# Patient Record
Sex: Female | Born: 1940
Health system: Southern US, Community
[De-identification: ages and names within clinical notes are randomized; demographics above are authoritative.]

## PROBLEM LIST (undated history)

## (undated) DIAGNOSIS — I10 Essential (primary) hypertension: Secondary | ICD-10-CM

## (undated) DIAGNOSIS — E785 Hyperlipidemia, unspecified: Secondary | ICD-10-CM

## (undated) DIAGNOSIS — R0600 Dyspnea, unspecified: Secondary | ICD-10-CM

## (undated) DIAGNOSIS — E039 Hypothyroidism, unspecified: Secondary | ICD-10-CM

## (undated) DIAGNOSIS — I739 Peripheral vascular disease, unspecified: Secondary | ICD-10-CM

## (undated) DIAGNOSIS — J449 Chronic obstructive pulmonary disease, unspecified: Secondary | ICD-10-CM

## (undated) DIAGNOSIS — F32A Depression, unspecified: Secondary | ICD-10-CM

## (undated) DIAGNOSIS — R0609 Other forms of dyspnea: Secondary | ICD-10-CM

## (undated) DIAGNOSIS — F329 Major depressive disorder, single episode, unspecified: Secondary | ICD-10-CM

## (undated) HISTORY — DX: Peripheral vascular disease, unspecified: I73.9

## (undated) HISTORY — DX: Hypothyroidism, unspecified: E03.9

## (undated) HISTORY — DX: Chronic obstructive pulmonary disease, unspecified: J44.9

## (undated) HISTORY — DX: Other forms of dyspnea: R06.09

## (undated) HISTORY — DX: Depression, unspecified: F32.A

## (undated) HISTORY — DX: Essential (primary) hypertension: I10

## (undated) HISTORY — DX: Major depressive disorder, single episode, unspecified: F32.9

## (undated) HISTORY — DX: Hyperlipidemia, unspecified: E78.5

## (undated) HISTORY — DX: Dyspnea, unspecified: R06.00

## (undated) HISTORY — PX: THROAT SURGERY: SHX803

## (undated) HISTORY — PX: OTHER SURGICAL HISTORY: SHX169

---

## 2009-12-03 ENCOUNTER — Ambulatory Visit: Payer: Self-pay | Admitting: Cardiovascular Disease

## 2009-12-22 ENCOUNTER — Telehealth (INDEPENDENT_AMBULATORY_CARE_PROVIDER_SITE_OTHER): Payer: Self-pay | Admitting: *Deleted

## 2009-12-22 ENCOUNTER — Encounter: Payer: Self-pay | Admitting: Cardiovascular Disease

## 2009-12-22 DIAGNOSIS — I70219 Atherosclerosis of native arteries of extremities with intermittent claudication, unspecified extremity: Secondary | ICD-10-CM | POA: Insufficient documentation

## 2009-12-23 ENCOUNTER — Ambulatory Visit: Payer: Self-pay

## 2009-12-23 ENCOUNTER — Ambulatory Visit: Payer: Self-pay | Admitting: Cardiology

## 2009-12-23 ENCOUNTER — Encounter: Payer: Self-pay | Admitting: Cardiovascular Disease

## 2009-12-23 ENCOUNTER — Encounter (HOSPITAL_COMMUNITY): Admission: RE | Admit: 2009-12-23 | Discharge: 2010-02-04 | Payer: Self-pay | Admitting: Cardiovascular Disease

## 2009-12-23 ENCOUNTER — Ambulatory Visit (HOSPITAL_COMMUNITY): Admission: RE | Admit: 2009-12-23 | Discharge: 2009-12-23 | Payer: Self-pay | Admitting: Cardiovascular Disease

## 2009-12-23 ENCOUNTER — Encounter: Payer: Self-pay | Admitting: Cardiology

## 2009-12-31 ENCOUNTER — Ambulatory Visit: Payer: Self-pay | Admitting: Cardiovascular Disease

## 2010-06-15 NOTE — Miscellaneous (Signed)
Summary: Orders Update  Clinical Lists Changes  Problems: Added new problem of ATHEROSLERO NATV ART EXTREM W/INTERMIT CLAUDICAT (ICD-440.21) Orders: Added new Test order of Arterial Duplex Lower Extremity (Arterial Duplex Low) - Signed

## 2010-06-15 NOTE — Progress Notes (Signed)
Summary: Nuclear Pre-Procedure  Phone Note Outgoing Call Call back at [facility] Phone [phone]   Call placed by: Eliezer Lofts, EMT-P,  December 22, 2009 2:46 PM Call placed to: Patient Action Taken: Phone Call Completed Summary of Call: Reviewed information on Myoview Information Sheet (see scanned document for further details).  Spoke with the patient.    Nuclear Med Background Indications for Stress Test: Evaluation for Ischemia   History: COPD   Symptoms: DOE    Nuclear Pre-Procedure Cardiac Risk Factors: History of Smoking, Hypertension, Lipids, PVD

## 2010-06-15 NOTE — Assessment & Plan Note (Signed)
Summary: Cardiology Nuclear Testing  Nuclear Med Background Indications for Stress Test: Evaluation for Ischemia   History: COPD  History Comments: NO DOCUMENTED CAD  Symptoms: Chest Tightness, DOE  Symptoms Comments: Last episode of [id number] week.   Nuclear Pre-Procedure Cardiac Risk Factors: Claudication, History of Smoking, Hypertension, Lipids, PVD Caffeine/Decaff Intake: None NPO After: 7:00 AM Lungs: Clear. IV 0.9% NS with Angio Cath: 24g     IV Site: Rt Hand IV Started by: Crissie Figures RN Chest Size (in) 38     Cup Size B     Height (in): 64 Weight (lb): 150 BMI: 25.84  Nuclear Med Study 1 or 2 day study:  1 day     Stress Test Type:  Stress Reading MD:  Loralie Champagne, MD     Referring MD:  Jerilynn Mages.Fletcher Anon, MD Resting Radionuclide:  Technetium 35m Tetrofosmin     Resting Radionuclide Dose:  10.9 mCi  Stress Radionuclide:  Technetium 51m Tetrofosmin     Stress Radionuclide Dose:  33.0 mCi   Stress Protocol Exercise Time (min):  4:00 min     Max HR:  130 bpm     Predicted Max HR:  Q000111Q bpm  Max Systolic BP: Q000111Q mm Hg     Percent Max HR:  86.67 %     METS: 5.3 Rate Pressure Product:  23920    Stress Test Technologist:  Valetta Fuller CMA-N     Nuclear Technologist:  Vedia Pereyra CNMT  Rest Procedure  Myocardial perfusion imaging was performed at rest 45 minutes following the intravenous administration of Myoview Technetium 10m Tetrofosmin.  Stress Procedure  The patient exercised for four minutes.  The patient stopped due to dyspnea with an O2 Sat of 85-91% at peak exercise; in recovery she was 99%.  She denied any chest pain.  There were nonspecific T- wave changes and a rare PJC.  Myoview was injected at peak exercise and myocardial perfusion imaging was performed after a brief delay.  QPS Raw Data Images:  Normal; no motion artifact; normal heart/lung ratio. Stress Images:  NI: Uniform and normal uptake of tracer in all myocardial segments. Rest Images:  Uniform and  normal uptake of tracer in all myocardial segments. Subtraction (SDS):  There is no evidence of scar or ischemia. Transient Ischemic Dilatation:  1.12  (Normal <1.22)  Lung/Heart Ratio:  0.24  (Normal <0.45)  Quantitative Gated Spect Images QGS EDV:  54 ml QGS ESV:  19 ml QGS EF:  66 % QGS cine images:  Normal wall motion.    Overall Impression  Exercise Capacity: Poor exercise capacity. BP Response: Normal blood pressure response. Clinical Symptoms: Dyspnea with decreased oxygen saturation to 85% ECG Impression: 1 mm horizontal ST depression in the inferior leads and V4-V5 noted in early recovery.  Overall Impression: Normal perfusion images with no evidence for ischemia or infarction.  ST changes likely are false positive.

## 2010-09-20 ENCOUNTER — Encounter: Payer: Self-pay | Admitting: Cardiovascular Disease

## 2010-09-20 ENCOUNTER — Ambulatory Visit: Payer: Self-pay | Admitting: Cardiovascular Disease

## 2010-09-23 ENCOUNTER — Encounter: Payer: Self-pay | Admitting: Cardiovascular Disease

## 2010-09-23 ENCOUNTER — Ambulatory Visit (INDEPENDENT_AMBULATORY_CARE_PROVIDER_SITE_OTHER): Payer: PRIVATE HEALTH INSURANCE | Admitting: Cardiovascular Disease

## 2010-09-23 VITALS — BP 150/82 | HR 96 | Ht 64.0 in | Wt 149.0 lb

## 2010-09-23 DIAGNOSIS — I739 Peripheral vascular disease, unspecified: Secondary | ICD-10-CM

## 2010-09-23 DIAGNOSIS — I10 Essential (primary) hypertension: Secondary | ICD-10-CM | POA: Insufficient documentation

## 2010-09-23 NOTE — Assessment & Plan Note (Signed)
The patient has a chronically occluded right subclavian artery with minimal symptoms at this time. She is right-handed and able to perform her activities of daily living without much limitations. There is no clinical symptoms of a subclavian steel syndrome. Thus, I recommend continuing conservative treatment and medical management of her risk factors. Continue daily aspirin. Blood pressure should always be checked in her left arm which are reflects central aortic pressure more accurately. The patient can followup with me as needed.

## 2010-09-23 NOTE — Patient Instructions (Signed)
Your physician recommends that you schedule a follow-up appointment in: as needed  

## 2010-09-23 NOTE — Progress Notes (Signed)
HPI  This is a 70 year old female who is here today for a followup visit. She has a history of chronically occluded right subclavian artery. This has been managed conservatively given that she has only minimal symptoms. There is no other clear symptoms of claudication in the right arm and no evidence of steal syndrome. She is doing well overall with no reported symptoms of chest pain, dizziness, or syncope.  No Known Allergies   Current Outpatient Prescriptions on File Prior to Visit  Medication Sig Dispense Refill  . amLODipine (NORVASC) 2.5 MG tablet Take 2.5 mg by mouth daily.        Marland Kitchen aspirin 325 MG tablet Take 325 mg by mouth daily.        Marland Kitchen conjugated estrogens (PREMARIN) vaginal cream Place vaginally once a week.        . esomeprazole (NEXIUM) 40 MG capsule Take 40 mg by mouth daily before breakfast.        . levothyroxine (SYNTHROID, LEVOTHROID) 100 MCG tablet Take 100 mcg by mouth daily.        Marland Kitchen loratadine (CLARITIN) 10 MG tablet Take 10 mg by mouth daily.        Marland Kitchen losartan (COZAAR) 100 MG tablet Take 100 mg by mouth daily.        . ranitidine (ZANTAC) 300 MG tablet Take 300 mg by mouth at bedtime.        . rosuvastatin (CRESTOR) 40 MG tablet Take 40 mg by mouth daily.        Marland Kitchen tiotropium (SPIRIVA) 18 MCG inhalation capsule Place 18 mcg into inhaler and inhale daily.        . vitamin B-12 (CYANOCOBALAMIN) 500 MCG tablet Take 500 mcg by mouth daily.           Past Medical History  Diagnosis Date  . PVD (peripheral vascular disease)     occluded right subclavian artery. asymptomatic.   Marland Kitchen COPD (chronic obstructive pulmonary disease)   . HLD (hyperlipidemia)   . Hypothyroidism   . Depression   . Exertional dyspnea   . HTN (hypertension)      Past Surgical History  Procedure Date  . Throat surgery   . Hysterectomy - unknown type      No family history on file.   History   Social History  . Marital Status: Single    Spouse Name: N/A    Number of Children: 1  .  Years of Education: N/A   Occupational History  . retired    Social History Main Topics  . Smoking status: Former Smoker    Quit date: 05/16/1994  . Smokeless tobacco: Not on file  . Alcohol Use: No  . Drug Use: No  . Sexually Active: Not on file   Other Topics Concern  . Not on file   Social History Narrative  . No narrative on file       PHYSICAL EXAM   BP 150/82  Pulse 96  Ht 5\' 4"  (1.626 m)  Wt 149 lb (67.586 kg)  BMI 25.58 kg/m2  SpO2 94%  Constitutional: She is oriented to person, place, and time. She appears well-developed and well-nourished. No distress.  HENT: No nasal discharge.  Head: Normocephalic and atraumatic.  Eyes: Pupils are equal, round, and reactive to light. Right eye exhibits no discharge. Left eye exhibits no discharge.  Neck: Normal range of motion. Neck supple. No JVD present. No thyromegaly present.  Cardiovascular: Normal rate, regular rhythm, normal heart sounds. Exam  reveals no gallop and no friction rub.  No murmur heard. Right radial pulse is not palpable.  Pulmonary/Chest: Effort normal and breath sounds normal. No stridor. No respiratory distress. She has no wheezes. She has no rales. She exhibits no tenderness.  Abdominal: Soft. Bowel sounds are normal. She exhibits no distension. There is no tenderness. There is no rebound and no guarding.  Musculoskeletal: Normal range of motion. She exhibits no edema and no tenderness.  Neurological: She is alert and oriented to person, place, and time. Coordination normal.  Skin: Skin is warm and dry. No rash noted. She is not diaphoretic. No erythema. No pallor.  Psychiatric: She has a normal mood and affect. Her behavior is normal. Judgment and thought content normal.     ASSESSMENT AND PLAN

## 2010-09-28 NOTE — Letter (Signed)
December 03, 2009    Maryella Shivers, MD  [address]  [place]  [place], [place] [address]   RE:  [name] [name]  MRN:  [id number]  /  DOB:  [birth year]   Dear Dr. Nyra Capes:   Thank you for referring [name] [name] for vascular evaluation.  As you are  aware, this is a pleasant 70 year old female with the following problem  list:  1. History of peripheral vascular disease with known occluded right      subclavian artery since at least 2009.  2. Chronic obstructive pulmonary disease.  3. Hypertension.  4. Hypothyroidism.  5. Hyperlipidemia.  6. History of depression.   [name] [name] is here today for evaluation of right subclavian occlusion.  The patient was noted over the last few years to have decreased pulses  in the right arm with difficulty obtaining blood pressure readings in  there.  In terms of her symptoms,  she complains of numbness in both  hands which is worse on the right side especially at night.  She is  right-handed and she is able to do all her activities of daily living  with her right hand.  She does not get any cramps or fatigue in the arm.  She denies any visual changes or dizziness with activities.  She does  complain of bilateral cramps in her lower extremities in the calf area  also worse on the right side.  This can happen at rest also and not  necessarily with activities.  She had few episodes of chest pain in the  past but none recently.  She does complain of exertional dyspnea with  minimal activities, especially recently.  She has no previous cardiac  history.  She denies any presyncope or syncope.  There are no reported  palpitations.   CURRENT MEDICATIONS:  1. Spiriva once daily.  2. Aspirin 325 mg once daily.  3. Claritin.  4. Levothyroxine 100 mcg once daily.  5. Losartan 100 mg once daily.  6. Nexium 40 mg daily.  7. Amlodipine 2.5 mg once daily.  8. Crestor 40 mg at night.  9. Ranitidine 300 mg at bedtime.  10.Veramyst once  daily.  11.Vitamin B12.   ALLERGIES:  No known drug allergies.   SOCIAL HISTORY:  She quit smoking in 1996.  She used to smoke one pack  per day for many years.  She denies any alcohol or drug use.  She is  retired.   PAST SURGICAL HISTORY:  Includes hysterectomy and tonsillectomy.   FAMILY HISTORY:  Negative for coronary artery disease or peripheral  arterial disease.   REVIEW OF SYSTEMS:  Remarkable for exertional dyspnea, arm numbness.  There is no chest pain.  She does have atypical lower extremity  claudication.  A full review of systems was performed and is otherwise  negative.   PHYSICAL EXAMINATION:  IN GENERAL:  She appears to be well-developed and  in no acute distress.  VITAL SIGNS:  Weight is 150 pounds, could not obtain the blood pressure  in the right arm.  Blood pressure in the left arm was 154/92, pulse is  93, oxygen saturation is 95% on room air.  NECK:  Examination reveals a surgical scar from previous surgery.  There  is no JVD or carotid bruits.  No masses are palpable.  RESPIRATORY:  Normal respiratory effort with no use of accessory  muscles.  Auscultation reveals normal breath sounds with no crackles or  wheezing.  CARDIOVASCULAR:  Normal PMI by  palpation.  Regular rate and rhythm with  no gallops or murmurs.  ABDOMEN:  Benign, nontender, nondistended.  EXTREMITIES:  With no clubbing, cyanosis or edema.  VASCULAR EXAM:  Radial pulses not palpable in the right side.  Brachial  pulse is very faint, +1.  On the left side the radial and brachial  pulses are normal.  There are no bruits in the subclavian area.  There  are no abdominal bruits.  Femoral pulses are well preserved.  Pedal  pulses bilaterally are diminished, +1.   An electrocardiogram was done which showed normal sinus rhythm with left  atrial enlargement and nonspecific T-wave changes.   IMPRESSION:  1. Peripheral arterial disease:  The patient has evidence of      subclavian occlusion.  She  did undergo an MRA which showed total      occlusion of the right subclavian artery at the origin.  The length      of the occlusion is approximately 3 cm.  The subclavian      reconstitute with collaterals in the right supraclavicular region      and the rest of the arteries seem to be normal.  It seems that this      occlusion is chronic and dated as far back as December of 2009 when      it was noted on CT of the neck.  The left side seems to be normal.      She does have a bovine arch.  Her current symptoms include numbness      in the arm which seems to be mild.  She does not have physical      limitation in the right arm.  There are no clear symptoms to      suggest steal syndrome.  Given that the occlusion is chronic, it      would be extremely difficult to intervene on this percutaneously.      If intervention is needed it will probably need to be surgical.  At      this time given that her symptoms are mild and she is able to      completely function in her right arm,  we  will observe this for      now.  Obviously, this indicates that she probably has diffuse      atherosclerosis related to her previous smoking and other risk      factors.  I agree with continuing aspirin daily.  She does have      symptoms of atypical claudication in the lower extremities and we      will evaluate this with an ankle-brachial index.  2. Exertional dyspnea.  We will have to rule out significant coronary      artery disease especially with the presence of atherosclerosis.  We      will request a stress test for further evaluation.  We will also      obtain an echocardiogram.  Aggressive treatment of medical      management is recommended as is being done.  The patient will      return for followup after these tests are done for further      evaluation.  Thank you for allowing ne to participate in the care      of your patient.    Sincerely,      [name], MD  Electronically  Signed    MA/MedQ  DD: 12/03/2009  DT: 12/03/2009  Job #: [id number]

## 2010-09-28 NOTE — Assessment & Plan Note (Signed)
[facility] OFFICE NOTE   [name], [name]                    MRN:          [id number]  DATE:12/31/2009                            DOB:          1940/11/27    This is a follow-up visit.   PROBLEM LIST:  1. History of peripheral vascular disease with known occluded right      subclavian artery.  2. Chronic obstructive pulmonary disease.  3. Hypertension.  4. Hyperlipidemia.  5. Hypothyroidism.  6. History of depression.   INTERVAL HISTORY:  [name] [name] was evaluated last month by me for a right  subclavian occlusion.  At that time, her symptoms were noted to be mild.  She also had atypical lower extremity discomfort.  Other complaint was  exertional dyspnea.  Thus, she underwent cardiac and vascular evaluation  with a stress test, echocardiogram, and an ABI.  Overall, she is still  feeling the same.  The main symptom in her right arm is numbness and  mild discomfort.  She is right-handed and she is able to do her  activities of daily living.   MEDICATIONS:  1. Spiriva.  2. Aspirin 325 mg daily.  3. Claritin.  4. Levothyroxine.  5. Losartan 100 mg once daily.  6. Nexium 40 mg once daily.  7. Amlodipine 2.5 mg once daily, which will be increased to 5 mg      daily.  8. Crestor 40 mg at bedtime.  9. Ranitidine.  10.Veramyst.  11.B12  12.Premarin.   ALLERGIES:  No known drug allergies.   PHYSICAL EXAMINATION:  GENERAL:  Overall, she appears to be in no acute  distress.  VITAL SIGNS:  Weight is 150 pounds, blood pressure is 157/92 in the left  arm, pulse is 98, oxygen saturation is 97% on room air.  NECK:  No JVD or carotid bruits.  LUNGS:  Clear to auscultation.  HEART:  Regular rate and rhythm with no gallops or murmurs.  ABDOMEN:  Benign, nontender, nondistended.  EXTREMITIES:  No clubbing, cyanosis, or edema.   IMPRESSION:  1. Peripheral arterial disease with chronic occlusion of  the right      subclavian artery.  At the present time, her symptoms seems to be      mild and she does not have any significant functional limitation in      the right arm.  Thus, revascularization is not indicated at this      time.  We will continue to monitor.  Her blood pressure should      always be checked in the left arm which will an estimate of central      blood pressure.  Blood pressure in the right arm will always be      very low.  We will continue with aggressive medical therapy for      risk factors.  We will continue daily aspirin as well as high-dose      Crestor.  2. Hypertension:  Her blood pressure is elevated and thus, we will      increase amlodipine to 5 mg once daily.  3. Exertional dyspnea with  multiple risk factors for coronary artery      disease.  She underwent stress test which showed no evidence of      ischemia.  She did have hypoxia with exercise which is likely due      to her chronic obstructive pulmonary disease.  She does not have      hypoxia at rest.  Her echocardiogram showed normal left ventricular      systolic function with mild diastolic dysfunction and no      significant valvular abnormalities.  The patient will follow up      with me in 9 months from now or earlier if needed.     [name] Sacramento, MD  Electronically Signed    MA/MedQ  DD: 12/31/2009  DT: 01/01/2010  Job #: [id number]

## 2010-11-09 ENCOUNTER — Encounter: Payer: Self-pay | Admitting: Cardiovascular Disease

## 2011-03-01 ENCOUNTER — Encounter: Payer: Self-pay | Admitting: Cardiovascular Disease

## 2016-10-25 DIAGNOSIS — K219 Gastro-esophageal reflux disease without esophagitis: Secondary | ICD-10-CM | POA: Diagnosis not present

## 2016-11-01 DIAGNOSIS — D631 Anemia in chronic kidney disease: Secondary | ICD-10-CM | POA: Diagnosis not present

## 2016-11-01 DIAGNOSIS — N184 Chronic kidney disease, stage 4 (severe): Secondary | ICD-10-CM | POA: Diagnosis not present

## 2016-11-01 DIAGNOSIS — Z0189 Encounter for other specified special examinations: Secondary | ICD-10-CM | POA: Diagnosis not present

## 2016-11-01 DIAGNOSIS — I129 Hypertensive chronic kidney disease with stage 1 through stage 4 chronic kidney disease, or unspecified chronic kidney disease: Secondary | ICD-10-CM | POA: Diagnosis not present

## 2016-11-01 DIAGNOSIS — N2581 Secondary hyperparathyroidism of renal origin: Secondary | ICD-10-CM | POA: Diagnosis not present

## 2016-11-02 DIAGNOSIS — Z139 Encounter for screening, unspecified: Secondary | ICD-10-CM | POA: Diagnosis not present

## 2016-11-02 DIAGNOSIS — E039 Hypothyroidism, unspecified: Secondary | ICD-10-CM | POA: Diagnosis not present

## 2016-11-02 DIAGNOSIS — Z6823 Body mass index (BMI) 23.0-23.9, adult: Secondary | ICD-10-CM | POA: Diagnosis not present

## 2016-11-02 DIAGNOSIS — J449 Chronic obstructive pulmonary disease, unspecified: Secondary | ICD-10-CM | POA: Diagnosis not present

## 2016-12-16 DIAGNOSIS — E039 Hypothyroidism, unspecified: Secondary | ICD-10-CM | POA: Diagnosis not present

## 2017-01-23 DIAGNOSIS — N184 Chronic kidney disease, stage 4 (severe): Secondary | ICD-10-CM | POA: Diagnosis not present

## 2017-02-03 DIAGNOSIS — N2581 Secondary hyperparathyroidism of renal origin: Secondary | ICD-10-CM | POA: Diagnosis not present

## 2017-02-03 DIAGNOSIS — I129 Hypertensive chronic kidney disease with stage 1 through stage 4 chronic kidney disease, or unspecified chronic kidney disease: Secondary | ICD-10-CM | POA: Diagnosis not present

## 2017-02-03 DIAGNOSIS — Z23 Encounter for immunization: Secondary | ICD-10-CM | POA: Diagnosis not present

## 2017-02-03 DIAGNOSIS — N183 Chronic kidney disease, stage 3 (moderate): Secondary | ICD-10-CM | POA: Diagnosis not present

## 2017-02-03 DIAGNOSIS — D631 Anemia in chronic kidney disease: Secondary | ICD-10-CM | POA: Diagnosis not present

## 2017-02-27 DIAGNOSIS — E039 Hypothyroidism, unspecified: Secondary | ICD-10-CM | POA: Diagnosis not present

## 2017-02-27 DIAGNOSIS — N183 Chronic kidney disease, stage 3 (moderate): Secondary | ICD-10-CM | POA: Diagnosis not present

## 2017-02-27 DIAGNOSIS — E782 Mixed hyperlipidemia: Secondary | ICD-10-CM | POA: Diagnosis not present

## 2017-02-27 DIAGNOSIS — I129 Hypertensive chronic kidney disease with stage 1 through stage 4 chronic kidney disease, or unspecified chronic kidney disease: Secondary | ICD-10-CM | POA: Diagnosis not present

## 2017-03-03 DIAGNOSIS — J3089 Other allergic rhinitis: Secondary | ICD-10-CM | POA: Diagnosis not present

## 2017-03-06 DIAGNOSIS — J3089 Other allergic rhinitis: Secondary | ICD-10-CM | POA: Diagnosis not present

## 2017-03-07 DIAGNOSIS — J3089 Other allergic rhinitis: Secondary | ICD-10-CM | POA: Diagnosis not present

## 2017-03-08 DIAGNOSIS — J3089 Other allergic rhinitis: Secondary | ICD-10-CM | POA: Diagnosis not present

## 2017-03-09 DIAGNOSIS — J3089 Other allergic rhinitis: Secondary | ICD-10-CM | POA: Diagnosis not present

## 2017-03-10 DIAGNOSIS — J3089 Other allergic rhinitis: Secondary | ICD-10-CM | POA: Diagnosis not present

## 2017-03-13 DIAGNOSIS — J3089 Other allergic rhinitis: Secondary | ICD-10-CM | POA: Diagnosis not present

## 2017-03-14 DIAGNOSIS — J3089 Other allergic rhinitis: Secondary | ICD-10-CM | POA: Diagnosis not present

## 2017-05-18 DIAGNOSIS — H2513 Age-related nuclear cataract, bilateral: Secondary | ICD-10-CM | POA: Diagnosis not present

## 2017-05-18 DIAGNOSIS — H5203 Hypermetropia, bilateral: Secondary | ICD-10-CM | POA: Diagnosis not present

## 2017-05-18 DIAGNOSIS — H43823 Vitreomacular adhesion, bilateral: Secondary | ICD-10-CM | POA: Diagnosis not present

## 2017-07-24 DIAGNOSIS — Z139 Encounter for screening, unspecified: Secondary | ICD-10-CM | POA: Diagnosis not present

## 2017-07-24 DIAGNOSIS — I129 Hypertensive chronic kidney disease with stage 1 through stage 4 chronic kidney disease, or unspecified chronic kidney disease: Secondary | ICD-10-CM | POA: Diagnosis not present

## 2017-07-24 DIAGNOSIS — E039 Hypothyroidism, unspecified: Secondary | ICD-10-CM | POA: Diagnosis not present

## 2017-07-24 DIAGNOSIS — Z9181 History of falling: Secondary | ICD-10-CM | POA: Diagnosis not present

## 2017-07-24 DIAGNOSIS — Z1331 Encounter for screening for depression: Secondary | ICD-10-CM | POA: Diagnosis not present

## 2017-07-24 DIAGNOSIS — Z0000 Encounter for general adult medical examination without abnormal findings: Secondary | ICD-10-CM | POA: Diagnosis not present

## 2017-07-24 DIAGNOSIS — E782 Mixed hyperlipidemia: Secondary | ICD-10-CM | POA: Diagnosis not present

## 2017-07-31 DIAGNOSIS — N184 Chronic kidney disease, stage 4 (severe): Secondary | ICD-10-CM | POA: Diagnosis not present

## 2017-07-31 DIAGNOSIS — I129 Hypertensive chronic kidney disease with stage 1 through stage 4 chronic kidney disease, or unspecified chronic kidney disease: Secondary | ICD-10-CM | POA: Diagnosis not present

## 2017-07-31 DIAGNOSIS — E039 Hypothyroidism, unspecified: Secondary | ICD-10-CM | POA: Diagnosis not present

## 2017-07-31 DIAGNOSIS — E782 Mixed hyperlipidemia: Secondary | ICD-10-CM | POA: Diagnosis not present

## 2017-08-30 DIAGNOSIS — N183 Chronic kidney disease, stage 3 (moderate): Secondary | ICD-10-CM | POA: Diagnosis not present

## 2017-08-30 DIAGNOSIS — N189 Chronic kidney disease, unspecified: Secondary | ICD-10-CM | POA: Diagnosis not present

## 2017-08-31 DIAGNOSIS — H2513 Age-related nuclear cataract, bilateral: Secondary | ICD-10-CM | POA: Diagnosis not present

## 2017-08-31 DIAGNOSIS — H43823 Vitreomacular adhesion, bilateral: Secondary | ICD-10-CM | POA: Diagnosis not present

## 2017-09-06 DIAGNOSIS — N2581 Secondary hyperparathyroidism of renal origin: Secondary | ICD-10-CM | POA: Diagnosis not present

## 2017-09-06 DIAGNOSIS — D631 Anemia in chronic kidney disease: Secondary | ICD-10-CM | POA: Diagnosis not present

## 2017-09-06 DIAGNOSIS — I129 Hypertensive chronic kidney disease with stage 1 through stage 4 chronic kidney disease, or unspecified chronic kidney disease: Secondary | ICD-10-CM | POA: Diagnosis not present

## 2017-09-06 DIAGNOSIS — N183 Chronic kidney disease, stage 3 (moderate): Secondary | ICD-10-CM | POA: Diagnosis not present

## 2017-09-13 DIAGNOSIS — I129 Hypertensive chronic kidney disease with stage 1 through stage 4 chronic kidney disease, or unspecified chronic kidney disease: Secondary | ICD-10-CM | POA: Diagnosis not present

## 2017-09-13 DIAGNOSIS — E039 Hypothyroidism, unspecified: Secondary | ICD-10-CM | POA: Diagnosis not present

## 2017-09-20 DIAGNOSIS — Z1331 Encounter for screening for depression: Secondary | ICD-10-CM | POA: Diagnosis not present

## 2017-09-20 DIAGNOSIS — N183 Chronic kidney disease, stage 3 (moderate): Secondary | ICD-10-CM | POA: Diagnosis not present

## 2017-09-20 DIAGNOSIS — E039 Hypothyroidism, unspecified: Secondary | ICD-10-CM | POA: Diagnosis not present

## 2017-09-20 DIAGNOSIS — I129 Hypertensive chronic kidney disease with stage 1 through stage 4 chronic kidney disease, or unspecified chronic kidney disease: Secondary | ICD-10-CM | POA: Diagnosis not present

## 2017-09-20 DIAGNOSIS — J449 Chronic obstructive pulmonary disease, unspecified: Secondary | ICD-10-CM | POA: Diagnosis not present

## 2017-09-20 DIAGNOSIS — Z139 Encounter for screening, unspecified: Secondary | ICD-10-CM | POA: Diagnosis not present

## 2017-09-20 DIAGNOSIS — Z0000 Encounter for general adult medical examination without abnormal findings: Secondary | ICD-10-CM | POA: Diagnosis not present

## 2017-11-30 DIAGNOSIS — Z1231 Encounter for screening mammogram for malignant neoplasm of breast: Secondary | ICD-10-CM | POA: Diagnosis not present

## 2018-02-27 DIAGNOSIS — N2581 Secondary hyperparathyroidism of renal origin: Secondary | ICD-10-CM | POA: Diagnosis not present

## 2018-02-27 DIAGNOSIS — N183 Chronic kidney disease, stage 3 (moderate): Secondary | ICD-10-CM | POA: Diagnosis not present

## 2018-02-27 DIAGNOSIS — N189 Chronic kidney disease, unspecified: Secondary | ICD-10-CM | POA: Diagnosis not present

## 2018-03-05 DIAGNOSIS — N2581 Secondary hyperparathyroidism of renal origin: Secondary | ICD-10-CM | POA: Diagnosis not present

## 2018-03-05 DIAGNOSIS — I129 Hypertensive chronic kidney disease with stage 1 through stage 4 chronic kidney disease, or unspecified chronic kidney disease: Secondary | ICD-10-CM | POA: Diagnosis not present

## 2018-03-05 DIAGNOSIS — N183 Chronic kidney disease, stage 3 (moderate): Secondary | ICD-10-CM | POA: Diagnosis not present

## 2018-03-05 DIAGNOSIS — D631 Anemia in chronic kidney disease: Secondary | ICD-10-CM | POA: Diagnosis not present

## 2018-03-21 DIAGNOSIS — E039 Hypothyroidism, unspecified: Secondary | ICD-10-CM | POA: Diagnosis not present

## 2018-03-21 DIAGNOSIS — I129 Hypertensive chronic kidney disease with stage 1 through stage 4 chronic kidney disease, or unspecified chronic kidney disease: Secondary | ICD-10-CM | POA: Diagnosis not present

## 2018-03-28 DIAGNOSIS — Z23 Encounter for immunization: Secondary | ICD-10-CM | POA: Diagnosis not present

## 2018-03-28 DIAGNOSIS — I129 Hypertensive chronic kidney disease with stage 1 through stage 4 chronic kidney disease, or unspecified chronic kidney disease: Secondary | ICD-10-CM | POA: Diagnosis not present

## 2018-03-28 DIAGNOSIS — E782 Mixed hyperlipidemia: Secondary | ICD-10-CM | POA: Diagnosis not present

## 2018-03-28 DIAGNOSIS — N183 Chronic kidney disease, stage 3 (moderate): Secondary | ICD-10-CM | POA: Diagnosis not present

## 2018-03-28 DIAGNOSIS — N184 Chronic kidney disease, stage 4 (severe): Secondary | ICD-10-CM | POA: Diagnosis not present

## 2018-05-22 DIAGNOSIS — H2513 Age-related nuclear cataract, bilateral: Secondary | ICD-10-CM | POA: Diagnosis not present

## 2018-05-22 DIAGNOSIS — H43823 Vitreomacular adhesion, bilateral: Secondary | ICD-10-CM | POA: Diagnosis not present

## 2018-05-22 DIAGNOSIS — H5203 Hypermetropia, bilateral: Secondary | ICD-10-CM | POA: Diagnosis not present

## 2018-07-16 DIAGNOSIS — J189 Pneumonia, unspecified organism: Secondary | ICD-10-CM | POA: Diagnosis not present

## 2018-08-06 DIAGNOSIS — Z6822 Body mass index (BMI) 22.0-22.9, adult: Secondary | ICD-10-CM | POA: Diagnosis not present

## 2018-08-06 DIAGNOSIS — Z7689 Persons encountering health services in other specified circumstances: Secondary | ICD-10-CM | POA: Diagnosis not present

## 2018-08-06 DIAGNOSIS — J189 Pneumonia, unspecified organism: Secondary | ICD-10-CM | POA: Diagnosis not present

## 2018-08-06 DIAGNOSIS — Z7189 Other specified counseling: Secondary | ICD-10-CM | POA: Diagnosis not present

## 2018-09-21 DIAGNOSIS — E782 Mixed hyperlipidemia: Secondary | ICD-10-CM | POA: Diagnosis not present

## 2018-09-21 DIAGNOSIS — E039 Hypothyroidism, unspecified: Secondary | ICD-10-CM | POA: Diagnosis not present

## 2018-09-21 DIAGNOSIS — I129 Hypertensive chronic kidney disease with stage 1 through stage 4 chronic kidney disease, or unspecified chronic kidney disease: Secondary | ICD-10-CM | POA: Diagnosis not present

## 2018-09-26 DIAGNOSIS — N184 Chronic kidney disease, stage 4 (severe): Secondary | ICD-10-CM | POA: Diagnosis not present

## 2018-09-26 DIAGNOSIS — I129 Hypertensive chronic kidney disease with stage 1 through stage 4 chronic kidney disease, or unspecified chronic kidney disease: Secondary | ICD-10-CM | POA: Diagnosis not present

## 2018-09-26 DIAGNOSIS — Z0000 Encounter for general adult medical examination without abnormal findings: Secondary | ICD-10-CM | POA: Diagnosis not present

## 2018-09-26 DIAGNOSIS — Z139 Encounter for screening, unspecified: Secondary | ICD-10-CM | POA: Diagnosis not present

## 2018-09-26 DIAGNOSIS — E039 Hypothyroidism, unspecified: Secondary | ICD-10-CM | POA: Diagnosis not present

## 2018-09-26 DIAGNOSIS — N183 Chronic kidney disease, stage 3 (moderate): Secondary | ICD-10-CM | POA: Diagnosis not present

## 2019-02-18 DIAGNOSIS — E782 Mixed hyperlipidemia: Secondary | ICD-10-CM | POA: Diagnosis not present

## 2019-02-18 DIAGNOSIS — E039 Hypothyroidism, unspecified: Secondary | ICD-10-CM | POA: Diagnosis not present

## 2019-02-25 DIAGNOSIS — N184 Chronic kidney disease, stage 4 (severe): Secondary | ICD-10-CM | POA: Diagnosis not present

## 2019-02-25 DIAGNOSIS — J449 Chronic obstructive pulmonary disease, unspecified: Secondary | ICD-10-CM | POA: Diagnosis not present

## 2019-02-25 DIAGNOSIS — E782 Mixed hyperlipidemia: Secondary | ICD-10-CM | POA: Diagnosis not present

## 2019-02-25 DIAGNOSIS — E039 Hypothyroidism, unspecified: Secondary | ICD-10-CM | POA: Diagnosis not present

## 2019-03-12 DIAGNOSIS — H2513 Age-related nuclear cataract, bilateral: Secondary | ICD-10-CM | POA: Diagnosis not present

## 2019-03-12 DIAGNOSIS — H2511 Age-related nuclear cataract, right eye: Secondary | ICD-10-CM | POA: Diagnosis not present

## 2019-03-12 DIAGNOSIS — H25043 Posterior subcapsular polar age-related cataract, bilateral: Secondary | ICD-10-CM | POA: Diagnosis not present

## 2019-03-12 DIAGNOSIS — H18413 Arcus senilis, bilateral: Secondary | ICD-10-CM | POA: Diagnosis not present

## 2019-03-12 DIAGNOSIS — H43821 Vitreomacular adhesion, right eye: Secondary | ICD-10-CM | POA: Diagnosis not present

## 2019-03-12 DIAGNOSIS — H25013 Cortical age-related cataract, bilateral: Secondary | ICD-10-CM | POA: Diagnosis not present

## 2019-03-25 DIAGNOSIS — H2511 Age-related nuclear cataract, right eye: Secondary | ICD-10-CM | POA: Diagnosis not present

## 2019-03-26 DIAGNOSIS — H2512 Age-related nuclear cataract, left eye: Secondary | ICD-10-CM | POA: Diagnosis not present

## 2019-04-03 DIAGNOSIS — I6523 Occlusion and stenosis of bilateral carotid arteries: Secondary | ICD-10-CM | POA: Diagnosis not present

## 2019-04-15 DIAGNOSIS — H2512 Age-related nuclear cataract, left eye: Secondary | ICD-10-CM | POA: Diagnosis not present

## 2019-04-18 ENCOUNTER — Other Ambulatory Visit: Payer: Self-pay | Admitting: Family Medicine

## 2019-04-18 DIAGNOSIS — I6523 Occlusion and stenosis of bilateral carotid arteries: Secondary | ICD-10-CM

## 2019-04-26 ENCOUNTER — Ambulatory Visit
Admission: RE | Admit: 2019-04-26 | Discharge: 2019-04-26 | Disposition: A | Discharge status: Home / Self Care | Payer: Medicare Other | Source: Ambulatory Visit | Attending: Family Medicine | Admitting: Family Medicine

## 2019-04-26 DIAGNOSIS — I6523 Occlusion and stenosis of bilateral carotid arteries: Secondary | ICD-10-CM

## 2019-05-22 DIAGNOSIS — G458 Other transient cerebral ischemic attacks and related syndromes: Secondary | ICD-10-CM | POA: Diagnosis not present

## 2019-05-22 DIAGNOSIS — I6523 Occlusion and stenosis of bilateral carotid arteries: Secondary | ICD-10-CM | POA: Diagnosis not present

## 2019-05-22 DIAGNOSIS — Z6822 Body mass index (BMI) 22.0-22.9, adult: Secondary | ICD-10-CM | POA: Diagnosis not present

## 2019-07-19 DIAGNOSIS — Z136 Encounter for screening for cardiovascular disorders: Secondary | ICD-10-CM | POA: Diagnosis not present

## 2019-07-19 DIAGNOSIS — E039 Hypothyroidism, unspecified: Secondary | ICD-10-CM | POA: Diagnosis not present

## 2019-07-19 DIAGNOSIS — Z139 Encounter for screening, unspecified: Secondary | ICD-10-CM | POA: Diagnosis not present

## 2019-07-19 DIAGNOSIS — E782 Mixed hyperlipidemia: Secondary | ICD-10-CM | POA: Diagnosis not present

## 2019-07-19 DIAGNOSIS — Z0000 Encounter for general adult medical examination without abnormal findings: Secondary | ICD-10-CM | POA: Diagnosis not present

## 2019-07-19 DIAGNOSIS — Z7189 Other specified counseling: Secondary | ICD-10-CM | POA: Diagnosis not present

## 2019-07-26 DIAGNOSIS — E039 Hypothyroidism, unspecified: Secondary | ICD-10-CM | POA: Diagnosis not present

## 2019-07-26 DIAGNOSIS — N184 Chronic kidney disease, stage 4 (severe): Secondary | ICD-10-CM | POA: Diagnosis not present

## 2019-07-26 DIAGNOSIS — E782 Mixed hyperlipidemia: Secondary | ICD-10-CM | POA: Diagnosis not present

## 2019-07-26 DIAGNOSIS — I129 Hypertensive chronic kidney disease with stage 1 through stage 4 chronic kidney disease, or unspecified chronic kidney disease: Secondary | ICD-10-CM | POA: Diagnosis not present

## 2019-08-09 ENCOUNTER — Telehealth (HOSPITAL_COMMUNITY): Payer: Self-pay

## 2019-08-09 NOTE — Telephone Encounter (Signed)

## 2019-08-12 ENCOUNTER — Other Ambulatory Visit: Payer: Self-pay | Admitting: *Deleted

## 2019-08-12 DIAGNOSIS — I6529 Occlusion and stenosis of unspecified carotid artery: Secondary | ICD-10-CM

## 2019-08-13 ENCOUNTER — Other Ambulatory Visit: Payer: Self-pay

## 2019-08-13 ENCOUNTER — Encounter: Payer: Self-pay | Admitting: Vascular Surgery

## 2019-08-13 ENCOUNTER — Ambulatory Visit (HOSPITAL_COMMUNITY)
Admission: RE | Admit: 2019-08-13 | Discharge: 2019-08-13 | Disposition: A | Discharge status: Home / Self Care | Payer: Medicare Other | Source: Ambulatory Visit | Attending: Vascular Surgery | Admitting: Vascular Surgery

## 2019-08-13 ENCOUNTER — Ambulatory Visit (INDEPENDENT_AMBULATORY_CARE_PROVIDER_SITE_OTHER): Payer: Medicare Other | Admitting: Vascular Surgery

## 2019-08-13 DIAGNOSIS — I779 Disorder of arteries and arterioles, unspecified: Secondary | ICD-10-CM | POA: Insufficient documentation

## 2019-08-13 DIAGNOSIS — I6523 Occlusion and stenosis of bilateral carotid arteries: Secondary | ICD-10-CM | POA: Diagnosis not present

## 2019-08-13 DIAGNOSIS — I6529 Occlusion and stenosis of unspecified carotid artery: Secondary | ICD-10-CM | POA: Diagnosis not present

## 2019-08-13 NOTE — Progress Notes (Signed)
Patient name: [name] [name] MRN: [id number] DOB: 1940/07/11 Sex: female  REASON FOR CONSULT: Evaluate carotid artery disease  HPI: [name] [name] is a 79 y.o. female, with history of hypertension, hyperlipidemia, COPD, stage IV CKD, depression, previous head/neck cancer requiring radiation that presents for evaluation of carotid artery disease.  Patient has been followed by Dr. Nyra Capes and an ultrasound was obtained on 04/26/2019 that showed stenosis in the left ICA approaching 70%.  She had also some plaque in the right carotid system but nothing that appeared hemodynamically significant.  Ultimately a further work-up was recommended with CTA but given her chronic kidney disease she was referred to vascular surgery.  She denies any history of TIA or strokes.  She remains neurologically intact.  She has had neck radiation for remote cancer in the past.  She has known chronically occluded right subclavian artery with no right arm symptoms.  Past Medical History:  Diagnosis Date  . COPD (chronic obstructive pulmonary disease) ([facility])   . Depression   . Exertional dyspnea   . HLD (hyperlipidemia)   . HTN (hypertension)   . Hypothyroidism   . PVD (peripheral vascular disease) ([facility])    occluded right subclavian artery. asymptomatic.     Past Surgical History:  Procedure Laterality Date  . hysterectomy - unknown type    . THROAT SURGERY      History reviewed. No pertinent family history.  SOCIAL HISTORY: Social History   Socioeconomic History  . Marital status: Single    Spouse name: Not on file  . Number of children: 1  . Years of education: Not on file  . Highest education level: Not on file  Occupational History  . Occupation: retired  Tobacco Use  . Smoking status: Former Smoker    Quit date: 05/16/1994    Years since quitting: 25.2  . Smokeless tobacco: Never Used  Substance and Sexual Activity  . Alcohol use: No  . Drug use: No  . Sexual activity: Not on file  Other  Topics Concern  . Not on file  Social History Narrative  . Not on file   Social Determinants of Health   Financial Resource Strain:   . Difficulty of Paying Living Expenses:   Food Insecurity:   . Worried About Charity fundraiser in the Last Year:   . Arboriculturist in the Last Year:   Transportation Needs:   . Film/video editor (Medical):   Marland Kitchen Lack of Transportation (Non-Medical):   Physical Activity:   . Days of Exercise per Week:   . Minutes of Exercise per Session:   Stress:   . Feeling of Stress :   Social Connections:   . Frequency of Communication with Friends and Family:   . Frequency of Social Gatherings with Friends and Family:   . Attends Religious Services:   . Active Member of Clubs or Organizations:   . Attends Archivist Meetings:   Marland Kitchen Marital Status:   Intimate Partner Violence:   . Fear of Current or Ex-Partner:   . Emotionally Abused:   Marland Kitchen Physically Abused:   . Sexually Abused:     No Known Allergies  Current Outpatient Medications  Medication Sig Dispense Refill  . amLODipine (NORVASC) 2.5 MG tablet Take 2.5 mg by mouth daily.      Marland Kitchen aspirin 325 MG tablet Take 325 mg by mouth daily.      Marland Kitchen conjugated estrogens (PREMARIN) vaginal cream Place vaginally once a week.      Marland Kitchen  esomeprazole (NEXIUM) 40 MG capsule Take 40 mg by mouth daily before breakfast.      . levothyroxine (SYNTHROID, LEVOTHROID) 100 MCG tablet Take 100 mcg by mouth daily.      Marland Kitchen loratadine (CLARITIN) 10 MG tablet Take 10 mg by mouth daily.      Marland Kitchen losartan (COZAAR) 100 MG tablet Take 100 mg by mouth daily.      . ranitidine (ZANTAC) 300 MG tablet Take 300 mg by mouth at bedtime.      . rosuvastatin (CRESTOR) 40 MG tablet Take 40 mg by mouth daily.      Marland Kitchen tiotropium (SPIRIVA) 18 MCG inhalation capsule Place 18 mcg into inhaler and inhale daily.      . vitamin B-12 (CYANOCOBALAMIN) 500 MCG tablet Take 500 mcg by mouth daily.       No current facility-administered  medications for this visit.    REVIEW OF SYSTEMS:  [X]  denotes positive finding, [ ]  denotes negative finding Cardiac  Comments:  Chest pain or chest pressure:    Shortness of breath upon exertion:    Short of breath when lying flat:    Irregular heart rhythm:        Vascular    Pain in calf, thigh, or hip brought on by ambulation:    Pain in feet at night that wakes you up from your sleep:     Blood clot in your veins:    Leg swelling:         Pulmonary    Oxygen at home:    Productive cough:     Wheezing:         Neurologic    Sudden weakness in arms or legs:     Sudden numbness in arms or legs:     Sudden onset of difficulty speaking or slurred speech:    Temporary loss of vision in one eye:     Problems with dizziness:         Gastrointestinal    Blood in stool:     Vomited blood:         Genitourinary    Burning when urinating:     Blood in urine:        Psychiatric    Major depression:         Hematologic    Bleeding problems:    Problems with blood clotting too easily:        Skin    Rashes or ulcers:        Constitutional    Fever or chills:      PHYSICAL EXAM: Vitals:   08/13/19 1158 08/13/19 1203  BP: (!) 145/77 95/72  Pulse: 77 78  Resp: 16   Temp: 98.1 F (36.7 C)   TempSrc: Temporal   SpO2: 99%   Weight: 127 lb (57.6 kg)   Height: 5\' 4"  (1.626 m)     GENERAL: The patient is a well-nourished female, in no acute distress. The vital signs are documented above. CARDIAC: There is a regular rate and rhythm.  VASCULAR:  Palpable femoral and DP pulses bilaterally PULMONARY: There is good air exchange bilaterally without wheezing or rales. ABDOMEN: Soft and non-tender with normal pitched bowel sounds.  MUSCULOSKELETAL: There are no major deformities or cyanosis. NEUROLOGIC: No focal weakness or paresthesias are detected. SKIN: There are no ulcers or rashes noted. PSYCHIATRIC: The patient has a normal affect.  DATA:   Repeat carotid  duplex here shows no significant right ICA disease and  left ICA velocity in the 40 to 59% stenosis range.  Assessment/Plan:  79 year old female presents for evaluation of her carotid artery disease.  As noted above her carotid artery disease has remained asymptomatic and a duplex from December suggested stenosis approaching 70% in the left ICA.  Repeat duplex here today suggest 40 to 59% stenosis on the left.  I discussed with the patient and her daughter that in the setting of asymptomatic disease we reserve any type of surgical intervention for stenosis greater than 80%.  Both ultrasound studies suggest less than 80% stenosis and would favor just surveillance for now.  Her velocity ratio the outside institution would certainly suggest not critical stenosis.  I will see her in 6 months with another carotid duplex.   Marty Heck, MD [facility] Office: [phone]

## 2019-08-14 ENCOUNTER — Other Ambulatory Visit: Payer: Self-pay | Admitting: *Deleted

## 2019-08-14 DIAGNOSIS — I6523 Occlusion and stenosis of bilateral carotid arteries: Secondary | ICD-10-CM

## 2020-02-11 DIAGNOSIS — E039 Hypothyroidism, unspecified: Secondary | ICD-10-CM | POA: Diagnosis not present

## 2020-02-11 DIAGNOSIS — E782 Mixed hyperlipidemia: Secondary | ICD-10-CM | POA: Diagnosis not present

## 2020-02-19 DIAGNOSIS — E039 Hypothyroidism, unspecified: Secondary | ICD-10-CM | POA: Diagnosis not present

## 2020-02-19 DIAGNOSIS — N184 Chronic kidney disease, stage 4 (severe): Secondary | ICD-10-CM | POA: Diagnosis not present

## 2020-02-19 DIAGNOSIS — J449 Chronic obstructive pulmonary disease, unspecified: Secondary | ICD-10-CM | POA: Diagnosis not present

## 2020-02-19 DIAGNOSIS — I779 Disorder of arteries and arterioles, unspecified: Secondary | ICD-10-CM | POA: Diagnosis not present

## 2020-03-09 ENCOUNTER — Other Ambulatory Visit: Payer: Self-pay | Admitting: *Deleted

## 2020-03-09 DIAGNOSIS — I6523 Occlusion and stenosis of bilateral carotid arteries: Secondary | ICD-10-CM

## 2020-03-16 DIAGNOSIS — I779 Disorder of arteries and arterioles, unspecified: Secondary | ICD-10-CM | POA: Diagnosis not present

## 2020-03-16 DIAGNOSIS — N184 Chronic kidney disease, stage 4 (severe): Secondary | ICD-10-CM | POA: Diagnosis not present

## 2020-03-16 DIAGNOSIS — E039 Hypothyroidism, unspecified: Secondary | ICD-10-CM | POA: Diagnosis not present

## 2020-03-16 DIAGNOSIS — J449 Chronic obstructive pulmonary disease, unspecified: Secondary | ICD-10-CM | POA: Diagnosis not present

## 2020-03-25 DIAGNOSIS — E039 Hypothyroidism, unspecified: Secondary | ICD-10-CM | POA: Diagnosis not present

## 2020-03-25 DIAGNOSIS — N184 Chronic kidney disease, stage 4 (severe): Secondary | ICD-10-CM | POA: Diagnosis not present

## 2020-04-01 DIAGNOSIS — E039 Hypothyroidism, unspecified: Secondary | ICD-10-CM | POA: Diagnosis not present

## 2020-04-01 DIAGNOSIS — Z6821 Body mass index (BMI) 21.0-21.9, adult: Secondary | ICD-10-CM | POA: Diagnosis not present

## 2020-04-01 DIAGNOSIS — N184 Chronic kidney disease, stage 4 (severe): Secondary | ICD-10-CM | POA: Diagnosis not present

## 2020-04-01 DIAGNOSIS — Z139 Encounter for screening, unspecified: Secondary | ICD-10-CM | POA: Diagnosis not present

## 2020-04-07 ENCOUNTER — Other Ambulatory Visit: Payer: Self-pay

## 2020-04-07 ENCOUNTER — Encounter: Payer: Self-pay | Admitting: Vascular Surgery

## 2020-04-07 ENCOUNTER — Ambulatory Visit (HOSPITAL_COMMUNITY)
Admission: RE | Admit: 2020-04-07 | Discharge: 2020-04-07 | Disposition: A | Discharge status: Home / Self Care | Payer: Medicare Other | Source: Ambulatory Visit | Attending: Vascular Surgery | Admitting: Vascular Surgery

## 2020-04-07 ENCOUNTER — Ambulatory Visit (INDEPENDENT_AMBULATORY_CARE_PROVIDER_SITE_OTHER): Payer: Medicare Other | Admitting: Vascular Surgery

## 2020-04-07 VITALS — BP 105/74 | HR 66 | Temp 97.9°F | Resp 14 | Ht 64.0 in | Wt 121.0 lb

## 2020-04-07 DIAGNOSIS — I6523 Occlusion and stenosis of bilateral carotid arteries: Secondary | ICD-10-CM | POA: Insufficient documentation

## 2020-04-07 NOTE — Progress Notes (Signed)
Patient name: [name] [name] MRN: [id number] DOB: [birth year] Sex: female  REASON FOR CONSULT: 6 month follow-up for carotid artery surveillance  HPI: [name] [name] is a 79 y.o. female, with history of hypertension, hyperlipidemia, COPD, stage IV CKD, depression, previous head/neck cancer requiring radiation approximately 20 years ago that presents for 6 month follow-up of her carotid artery disease.  Patient has been followed by Dr. Nyra Capes and had an ultrasound was obtained on 04/26/2019 that showed stenosis in the left ICA approaching 70%.  She also had some plaque in the right carotid system but nothing that appeared hemodynamically significant.  Ultimately a further work-up was recommended with CTA but given her chronic kidney disease she was referred to vascular surgery.   When we initially did her ultrasound here 6 months ago it only showed a 40 to 59% left ICA stenosis and we recommended continued surveillance. She reports no changes to her medical history over the last 6 months.  No weakness or numbness in her arm or leg.  No vision loss.  Appears at her baseline.  Her PCP did stop her aspirin due to ongoing nosebleeds.  Past Medical History:  Diagnosis Date  . COPD (chronic obstructive pulmonary disease) ([place])   . Depression   . Exertional dyspnea   . HLD (hyperlipidemia)   . HTN (hypertension)   . Hypothyroidism   . PVD (peripheral vascular disease) ([facility])    occluded right subclavian artery. asymptomatic.     Past Surgical History:  Procedure Laterality Date  . hysterectomy - unknown type    . THROAT SURGERY      History reviewed. No pertinent family history.  SOCIAL HISTORY: Social History   Socioeconomic History  . Marital status: Single    Spouse name: Not on file  . Number of children: 1  . Years of education: Not on file  . Highest education level: Not on file  Occupational History  . Occupation: retired  Tobacco Use  . Smoking status: Former Smoker     Quit date: 05/16/1994    Years since quitting: 25.9  . Smokeless tobacco: Never Used  Substance and Sexual Activity  . Alcohol use: No  . Drug use: No  . Sexual activity: Not on file  Other Topics Concern  . Not on file  Social History Narrative  . Not on file   Social Determinants of Health   Financial Resource Strain:   . Difficulty of Paying Living Expenses: Not on file  Food Insecurity:   . Worried About Charity fundraiser in the Last Year: Not on file  . Ran Out of Food in the Last Year: Not on file  Transportation Needs:   . Lack of Transportation (Medical): Not on file  . Lack of Transportation (Non-Medical): Not on file  Physical Activity:   . Days of Exercise per Week: Not on file  . Minutes of Exercise per Session: Not on file  Stress:   . Feeling of Stress : Not on file  Social Connections:   . Frequency of Communication with Friends and Family: Not on file  . Frequency of Social Gatherings with Friends and Family: Not on file  . Attends Religious Services: Not on file  . Active Member of Clubs or Organizations: Not on file  . Attends Archivist Meetings: Not on file  . Marital Status: Not on file  Intimate Partner Violence:   . Fear of Current or Ex-Partner: Not on file  . Emotionally Abused: Not  on file  . Physically Abused: Not on file  . Sexually Abused: Not on file    No Known Allergies  Current Outpatient Medications  Medication Sig Dispense Refill  . amLODipine (NORVASC) 2.5 MG tablet Take 2.5 mg by mouth daily.      Marland Kitchen conjugated estrogens (PREMARIN) vaginal cream Place vaginally once a week.      . esomeprazole (NEXIUM) 40 MG capsule Take 40 mg by mouth daily before breakfast.      . levothyroxine (SYNTHROID, LEVOTHROID) 100 MCG tablet Take 100 mcg by mouth daily.      Marland Kitchen loratadine (CLARITIN) 10 MG tablet Take 10 mg by mouth daily.      Marland Kitchen losartan (COZAAR) 100 MG tablet Take 100 mg by mouth daily.      . ranitidine (ZANTAC) 300 MG tablet  Take 300 mg by mouth at bedtime.      . rosuvastatin (CRESTOR) 40 MG tablet Take 40 mg by mouth daily.      Marland Kitchen tiotropium (SPIRIVA) 18 MCG inhalation capsule Place 18 mcg into inhaler and inhale daily.      . vitamin B-12 (CYANOCOBALAMIN) 500 MCG tablet Take 500 mcg by mouth daily.      Marland Kitchen aspirin 325 MG tablet Take 325 mg by mouth daily.   (Patient not taking: Reported on 04/07/2020)     No current facility-administered medications for this visit.    REVIEW OF SYSTEMS:  [X]  denotes positive finding, [ ]  denotes negative finding Cardiac  Comments:  Chest pain or chest pressure:    Shortness of breath upon exertion:    Short of breath when lying flat:    Irregular heart rhythm:        Vascular    Pain in calf, thigh, or hip brought on by ambulation:    Pain in feet at night that wakes you up from your sleep:     Blood clot in your veins:    Leg swelling:         Pulmonary    Oxygen at home:    Productive cough:     Wheezing:         Neurologic    Sudden weakness in arms or legs:     Sudden numbness in arms or legs:     Sudden onset of difficulty speaking or slurred speech:    Temporary loss of vision in one eye:     Problems with dizziness:         Gastrointestinal    Blood in stool:     Vomited blood:         Genitourinary    Burning when urinating:     Blood in urine:        Psychiatric    Major depression:         Hematologic    Bleeding problems:    Problems with blood clotting too easily:        Skin    Rashes or ulcers:        Constitutional    Fever or chills:      PHYSICAL EXAM: Vitals:   04/07/20 1119 04/07/20 1121  BP: (!) 153/81 105/74  Pulse: 66 66  Resp: 14   Temp: 97.9 F (36.6 C)   TempSrc: Temporal   SpO2: 95%   Weight: 121 lb (54.9 kg)   Height: 5\' 4"  (1.626 m)     GENERAL: The patient is a well-nourished female, in no acute distress. The vital signs  are documented above. CARDIAC: There is a regular rate and rhythm.  VASCULAR:    Palpable femoral and DP pulses bilaterally PULMONARY: There is good air exchange bilaterally without wheezing or rales. ABDOMEN: Soft and non-tender with normal pitched bowel sounds.  MUSCULOSKELETAL: There are no major deformities or cyanosis. NEUROLOGIC: No focal weakness or paresthesias are detected.  CN II-XII grossly intact. PSYCHIATRIC: The patient has a normal affect.  DATA:   Repeat carotid duplex here shows minimal 1 to 39% in the proximal right ICA and a 40 to 59% stenosis in the distal ICA.  Left ICA also has a stable 40 to 59% stenosis.  Assessment/Plan:  79 year old female presents for 6 month follow-up of her carotid artery disease.  As noted above her carotid artery disease has remained asymptomatic and a duplex from last December suggested stenosis approaching 70% in the left ICA.  Repeat duplex here 6 months ago suggested 40 to 59% stenosis on the left.  This appears stable on the left in the 40-59% range again today. I discussed with the patient and her daughter that in the setting of asymptomatic disease we reserve any type of surgical intervention for stenosis greater than 80%.  Given two ultrasounds showing 40 to 59% stenosis will arrange follow-up in 1 year with ongoing carotid duplex.   Marty Heck, MD [facility] Office: [phone]

## 2020-04-15 DIAGNOSIS — J449 Chronic obstructive pulmonary disease, unspecified: Secondary | ICD-10-CM | POA: Diagnosis not present

## 2020-04-15 DIAGNOSIS — E039 Hypothyroidism, unspecified: Secondary | ICD-10-CM | POA: Diagnosis not present

## 2020-04-15 DIAGNOSIS — I779 Disorder of arteries and arterioles, unspecified: Secondary | ICD-10-CM | POA: Diagnosis not present

## 2020-04-15 DIAGNOSIS — N184 Chronic kidney disease, stage 4 (severe): Secondary | ICD-10-CM | POA: Diagnosis not present

## 2020-05-16 DIAGNOSIS — E039 Hypothyroidism, unspecified: Secondary | ICD-10-CM | POA: Diagnosis not present

## 2020-05-16 DIAGNOSIS — N184 Chronic kidney disease, stage 4 (severe): Secondary | ICD-10-CM | POA: Insufficient documentation

## 2020-05-16 DIAGNOSIS — J449 Chronic obstructive pulmonary disease, unspecified: Secondary | ICD-10-CM | POA: Diagnosis not present

## 2020-05-16 DIAGNOSIS — I779 Disorder of arteries and arterioles, unspecified: Secondary | ICD-10-CM | POA: Diagnosis not present

## 2020-05-16 DIAGNOSIS — E782 Mixed hyperlipidemia: Secondary | ICD-10-CM | POA: Insufficient documentation

## 2020-06-16 DIAGNOSIS — I779 Disorder of arteries and arterioles, unspecified: Secondary | ICD-10-CM | POA: Diagnosis not present

## 2020-06-16 DIAGNOSIS — N184 Chronic kidney disease, stage 4 (severe): Secondary | ICD-10-CM | POA: Diagnosis not present

## 2020-06-16 DIAGNOSIS — J449 Chronic obstructive pulmonary disease, unspecified: Secondary | ICD-10-CM | POA: Diagnosis not present

## 2020-06-16 DIAGNOSIS — E039 Hypothyroidism, unspecified: Secondary | ICD-10-CM | POA: Diagnosis not present

## 2020-07-13 DIAGNOSIS — N186 End stage renal disease: Secondary | ICD-10-CM | POA: Diagnosis not present

## 2020-07-13 DIAGNOSIS — E1122 Type 2 diabetes mellitus with diabetic chronic kidney disease: Secondary | ICD-10-CM | POA: Diagnosis not present

## 2020-07-13 DIAGNOSIS — Z992 Dependence on renal dialysis: Secondary | ICD-10-CM | POA: Diagnosis not present

## 2020-07-14 DIAGNOSIS — N184 Chronic kidney disease, stage 4 (severe): Secondary | ICD-10-CM | POA: Diagnosis not present

## 2020-07-14 DIAGNOSIS — E039 Hypothyroidism, unspecified: Secondary | ICD-10-CM | POA: Diagnosis not present

## 2020-07-14 DIAGNOSIS — I779 Disorder of arteries and arterioles, unspecified: Secondary | ICD-10-CM | POA: Diagnosis not present

## 2020-07-14 DIAGNOSIS — J449 Chronic obstructive pulmonary disease, unspecified: Secondary | ICD-10-CM | POA: Diagnosis not present

## 2020-08-14 DIAGNOSIS — I129 Hypertensive chronic kidney disease with stage 1 through stage 4 chronic kidney disease, or unspecified chronic kidney disease: Secondary | ICD-10-CM | POA: Diagnosis not present

## 2020-08-14 DIAGNOSIS — J449 Chronic obstructive pulmonary disease, unspecified: Secondary | ICD-10-CM | POA: Diagnosis not present

## 2020-08-21 DIAGNOSIS — E782 Mixed hyperlipidemia: Secondary | ICD-10-CM | POA: Diagnosis not present

## 2020-08-21 DIAGNOSIS — E039 Hypothyroidism, unspecified: Secondary | ICD-10-CM | POA: Diagnosis not present

## 2020-08-31 DIAGNOSIS — Z139 Encounter for screening, unspecified: Secondary | ICD-10-CM | POA: Diagnosis not present

## 2020-08-31 DIAGNOSIS — N184 Chronic kidney disease, stage 4 (severe): Secondary | ICD-10-CM | POA: Diagnosis not present

## 2020-08-31 DIAGNOSIS — E039 Hypothyroidism, unspecified: Secondary | ICD-10-CM | POA: Diagnosis not present

## 2020-08-31 DIAGNOSIS — Z0000 Encounter for general adult medical examination without abnormal findings: Secondary | ICD-10-CM | POA: Diagnosis not present

## 2020-08-31 DIAGNOSIS — Z136 Encounter for screening for cardiovascular disorders: Secondary | ICD-10-CM | POA: Diagnosis not present

## 2020-08-31 DIAGNOSIS — Z7189 Other specified counseling: Secondary | ICD-10-CM | POA: Diagnosis not present

## 2020-08-31 DIAGNOSIS — J449 Chronic obstructive pulmonary disease, unspecified: Secondary | ICD-10-CM | POA: Diagnosis not present

## 2020-08-31 DIAGNOSIS — E782 Mixed hyperlipidemia: Secondary | ICD-10-CM | POA: Diagnosis not present

## 2020-09-13 DIAGNOSIS — I129 Hypertensive chronic kidney disease with stage 1 through stage 4 chronic kidney disease, or unspecified chronic kidney disease: Secondary | ICD-10-CM | POA: Diagnosis not present

## 2020-09-13 DIAGNOSIS — E1169 Type 2 diabetes mellitus with other specified complication: Secondary | ICD-10-CM | POA: Diagnosis not present

## 2020-10-14 DIAGNOSIS — N184 Chronic kidney disease, stage 4 (severe): Secondary | ICD-10-CM | POA: Diagnosis not present

## 2020-10-14 DIAGNOSIS — I10 Essential (primary) hypertension: Secondary | ICD-10-CM | POA: Diagnosis not present

## 2020-10-14 DIAGNOSIS — E039 Hypothyroidism, unspecified: Secondary | ICD-10-CM | POA: Diagnosis not present

## 2020-10-14 DIAGNOSIS — J449 Chronic obstructive pulmonary disease, unspecified: Secondary | ICD-10-CM | POA: Diagnosis not present

## 2020-10-16 DIAGNOSIS — J449 Chronic obstructive pulmonary disease, unspecified: Secondary | ICD-10-CM | POA: Diagnosis not present

## 2020-10-16 DIAGNOSIS — N184 Chronic kidney disease, stage 4 (severe): Secondary | ICD-10-CM | POA: Diagnosis not present

## 2020-10-16 DIAGNOSIS — E039 Hypothyroidism, unspecified: Secondary | ICD-10-CM | POA: Diagnosis not present

## 2020-10-16 DIAGNOSIS — R634 Abnormal weight loss: Secondary | ICD-10-CM | POA: Diagnosis not present

## 2020-10-20 DIAGNOSIS — R053 Chronic cough: Secondary | ICD-10-CM | POA: Diagnosis not present

## 2020-10-20 DIAGNOSIS — I2510 Atherosclerotic heart disease of native coronary artery without angina pectoris: Secondary | ICD-10-CM | POA: Diagnosis not present

## 2020-10-20 DIAGNOSIS — J432 Centrilobular emphysema: Secondary | ICD-10-CM | POA: Diagnosis not present

## 2020-10-20 DIAGNOSIS — Z87891 Personal history of nicotine dependence: Secondary | ICD-10-CM | POA: Diagnosis not present

## 2020-10-20 DIAGNOSIS — R634 Abnormal weight loss: Secondary | ICD-10-CM | POA: Diagnosis not present

## 2020-10-20 DIAGNOSIS — Z8521 Personal history of malignant neoplasm of larynx: Secondary | ICD-10-CM | POA: Diagnosis not present

## 2020-10-23 DIAGNOSIS — N200 Calculus of kidney: Secondary | ICD-10-CM | POA: Diagnosis not present

## 2020-10-23 DIAGNOSIS — I700 Atherosclerosis of aorta: Secondary | ICD-10-CM | POA: Diagnosis not present

## 2020-10-23 DIAGNOSIS — R918 Other nonspecific abnormal finding of lung field: Secondary | ICD-10-CM | POA: Diagnosis not present

## 2020-10-23 DIAGNOSIS — A310 Pulmonary mycobacterial infection: Secondary | ICD-10-CM | POA: Diagnosis not present

## 2020-10-29 IMAGING — US US CAROTID DUPLEX BILAT
1 series · 12 of 24 positions shown · non-contrast
Comparison: 04/17/2007

CLINICAL DATA: Carotid artery disease. History of hypertension and
hyperlipidemia. Former smoker.

EXAM:
BILATERAL CAROTID DUPLEX ULTRASOUND
TECHNIQUE: Gray scale imaging, color Doppler and duplex ultrasound were
performed of bilateral carotid and vertebral arteries in the neck.

[Series 1: us carotid duplex bilat · 0.05mm/px · 12 of 61 slices shown]
[im 3/61]
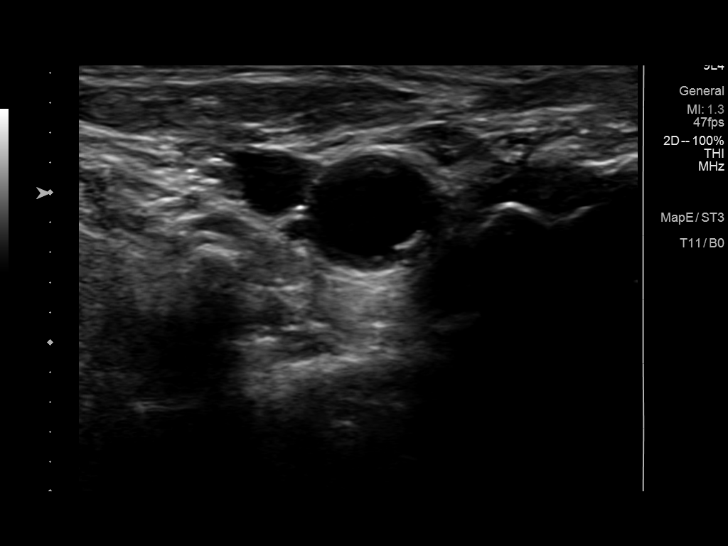
[im 8/61]
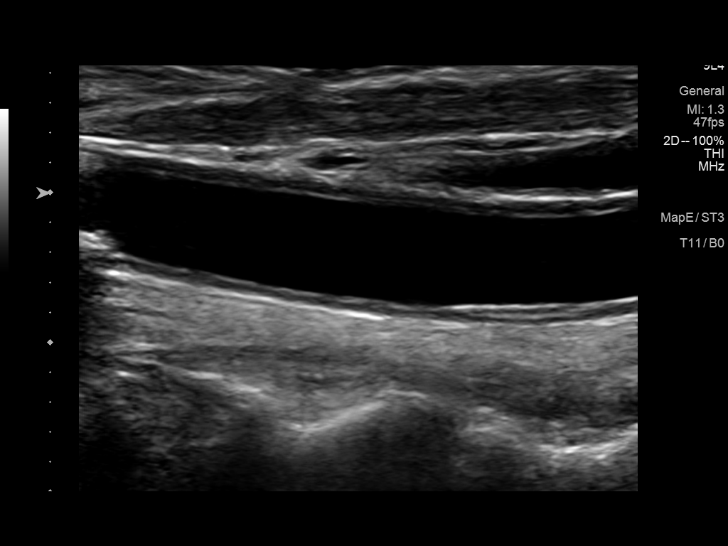
[im 14/61]
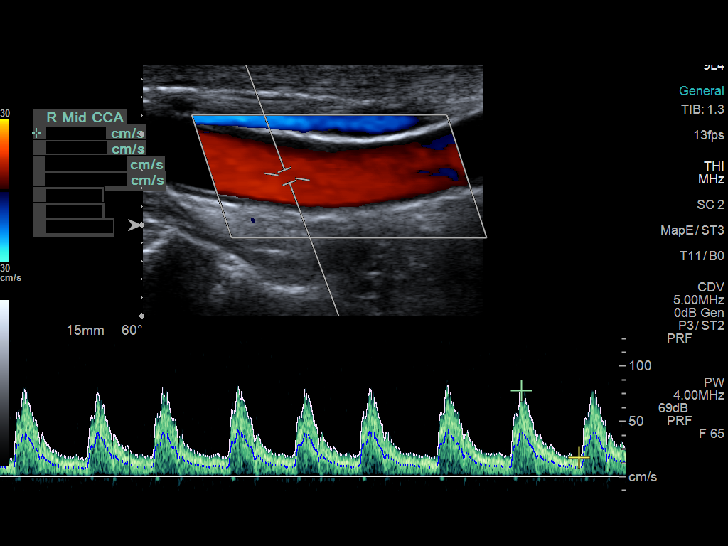
[im 19/61]
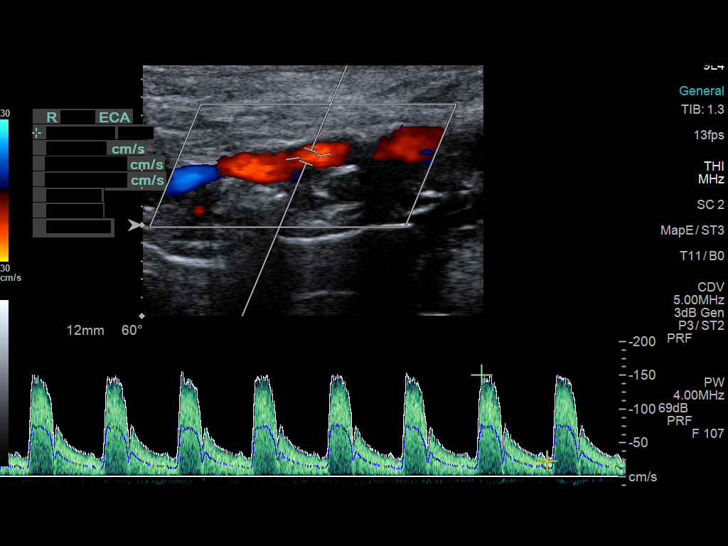
[im 24/61]
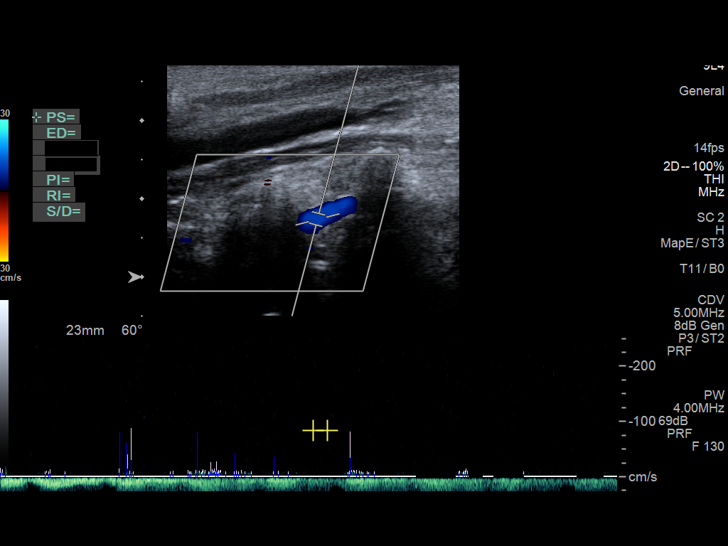
[im 29/61]
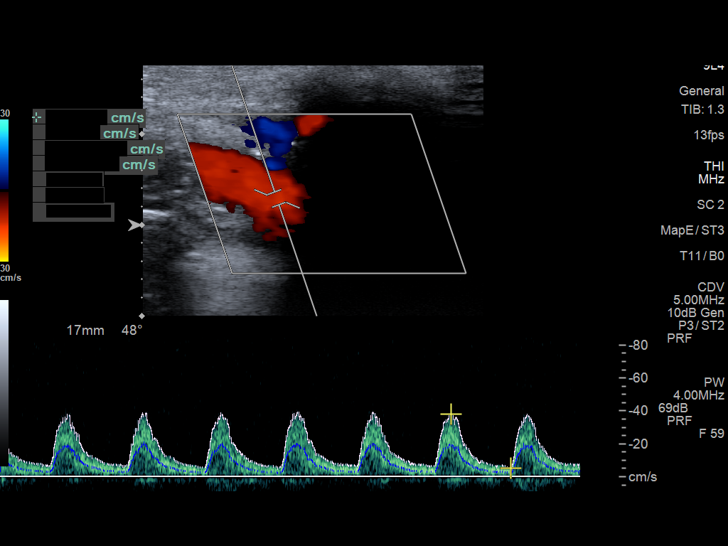
[im 34/61]
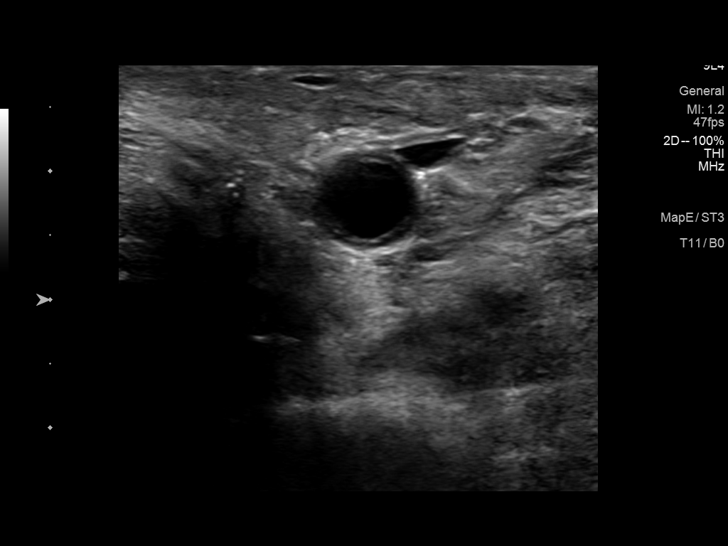
[im 40/61]
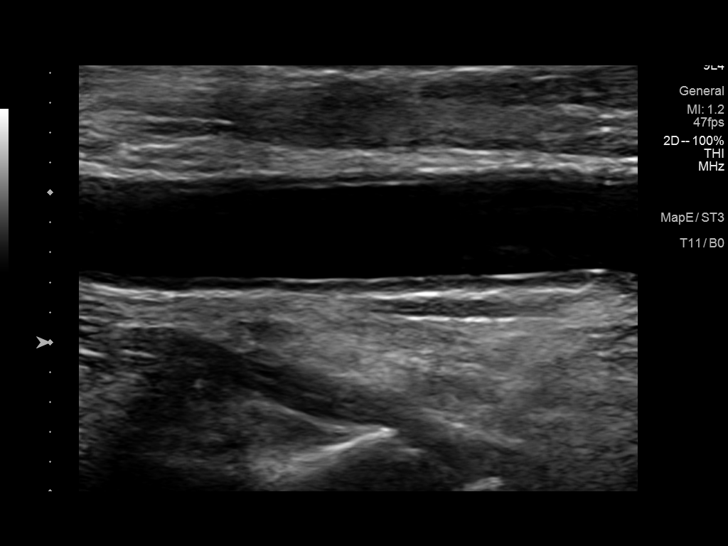
[im 45/61]
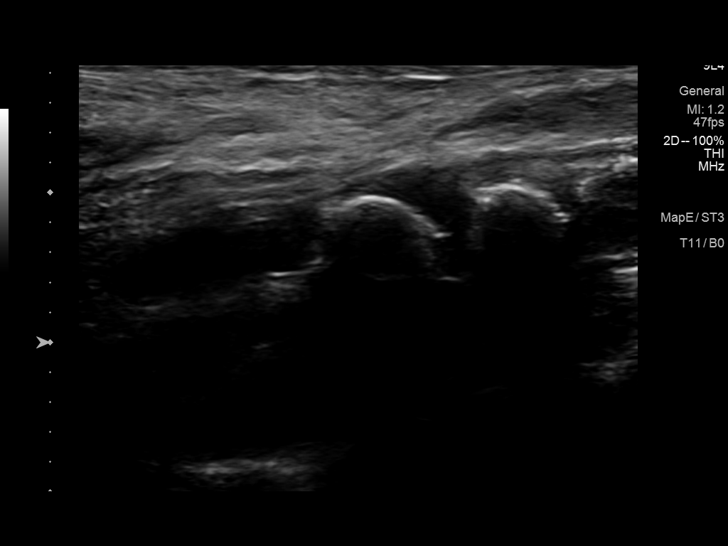
[im 50/61]
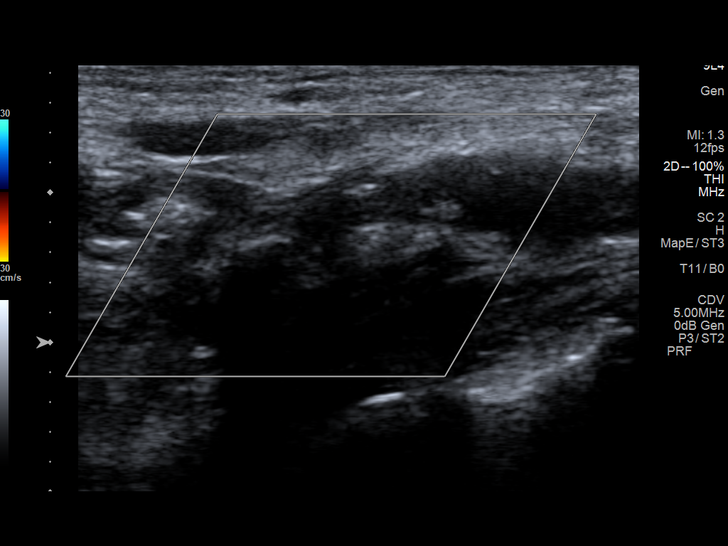
[im 55/61]
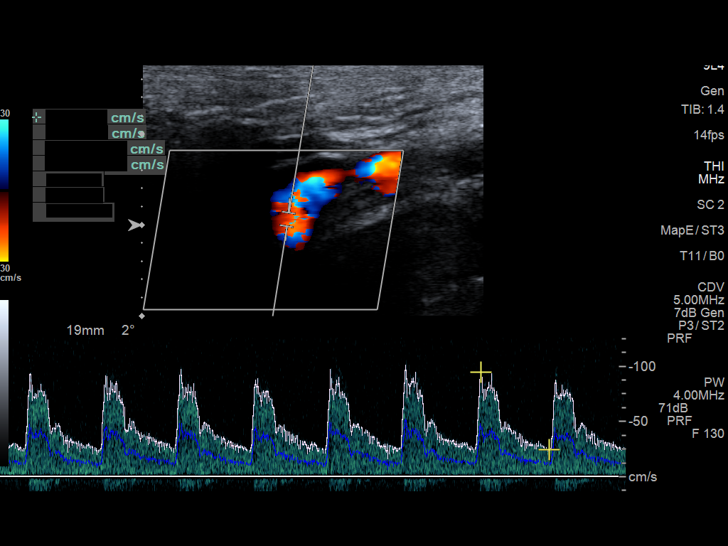
[im 61/61]
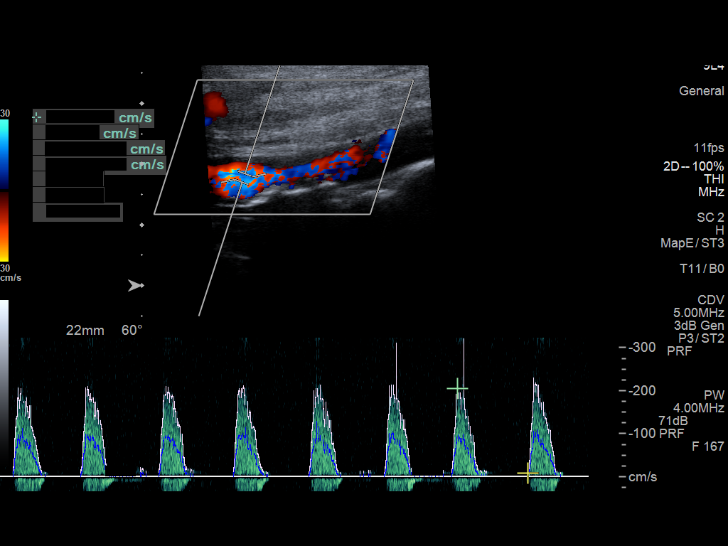

[12 of 24 positions shown; findings below may reference images not displayed]

FINDINGS: Criteria: Quantification of carotid stenosis is based on velocity
parameters that correlate the residual internal carotid diameter
with NASCET-based stenosis levels, using the diameter of the distal
internal carotid lumen as the denominator for stenosis measurement.

The following velocity measurements were obtained:

RIGHT

ICA: 117/31 cm/sec

CCA: 108/25 cm/sec

SYSTOLIC ICA/CCA RATIO:

ECA: 150 cm/sec

LEFT

ICA: 215/51 cm/sec

CCA: 92/17 cm/sec

SYSTOLIC ICA/CCA RATIO:

ECA: Occluded

RIGHT CAROTID ARTERY: There is a moderate amount of eccentric mixed
echogenic plaque within the distal aspect of the right common
carotid artery (image 10), extending to involve the right carotid
bulb (image 11), origin and proximal aspects of the right internal
carotid artery (image 13), progressed compared to remote examination
performed in 7008, though again not resulting in elevated peak
systolic velocities within the interrogated course the right
internal carotid artery to suggest a hemodynamically significant
stenosis.

RIGHT VERTEBRAL ARTERY: Retrograde atretic flow (image 27);
antegrade flow was demonstrated on the 7008 examination

LEFT CAROTID ARTERY: There is a moderate to large amount of
eccentric echogenic plaque within the left carotid bulb (image 43),
extending to involve the origin and proximal aspects of the left
internal carotid artery (image 45), morphologically progressed
compared to the 7008 examination and results in further elevated
peak systolic velocities within the left internal carotid artery.
Greatest acquired peak systolic velocity within the distal aspect of
the left internal carotid artery now measures 215 centimeters/second
(image 55) previously, greatest acquired peak systolic velocity
within the mid left ICA measured 134 centimeters/second.

LEFT VERTEBRAL ARTERY:  Antegrade flow

Upper extremity blood pressures: RIGHT: 102/66 LEFT: 150/84
IMPRESSION: 1. Interval progression of now moderate to large amount of
left-sided atherosclerotic plaque which results in further elevation
of peak systolic velocities within the left internal carotid artery,
now approaching the greater than 70% luminal narrowing range.
Further evaluation with CTA could performed as clinically indicated.
2. Moderate amount of right-sided atherosclerotic plaque,
morphologically progressed compared to the 7008 examination, though
not resulting in a hemodynamically significant stenosis.
3. Findings worrisome for right-sided subclavian steal syndrome
including asymmetrically decreased blood pressure within the right
upper extremity and retrograde flow within the right vertebral
artery. Again, this could be further evaluated with CTA as
indicated.

## 2020-11-13 DIAGNOSIS — I10 Essential (primary) hypertension: Secondary | ICD-10-CM | POA: Diagnosis not present

## 2020-11-13 DIAGNOSIS — J449 Chronic obstructive pulmonary disease, unspecified: Secondary | ICD-10-CM | POA: Diagnosis not present

## 2020-11-13 DIAGNOSIS — E039 Hypothyroidism, unspecified: Secondary | ICD-10-CM | POA: Diagnosis not present

## 2020-11-14 DIAGNOSIS — Z742 Need for assistance at home and no other household member able to render care: Secondary | ICD-10-CM | POA: Diagnosis not present

## 2020-11-14 DIAGNOSIS — R000 Tachycardia, unspecified: Secondary | ICD-10-CM | POA: Diagnosis not present

## 2020-11-14 DIAGNOSIS — Z87891 Personal history of nicotine dependence: Secondary | ICD-10-CM | POA: Diagnosis not present

## 2020-11-14 DIAGNOSIS — E039 Hypothyroidism, unspecified: Secondary | ICD-10-CM | POA: Diagnosis not present

## 2020-11-14 DIAGNOSIS — J9811 Atelectasis: Secondary | ICD-10-CM | POA: Diagnosis not present

## 2020-11-14 DIAGNOSIS — I10 Essential (primary) hypertension: Secondary | ICD-10-CM | POA: Diagnosis not present

## 2020-11-14 DIAGNOSIS — Z79899 Other long term (current) drug therapy: Secondary | ICD-10-CM | POA: Diagnosis not present

## 2020-11-14 DIAGNOSIS — R6520 Severe sepsis without septic shock: Secondary | ICD-10-CM | POA: Diagnosis not present

## 2020-11-14 DIAGNOSIS — Z23 Encounter for immunization: Secondary | ICD-10-CM | POA: Diagnosis not present

## 2020-11-14 DIAGNOSIS — I2510 Atherosclerotic heart disease of native coronary artery without angina pectoris: Secondary | ICD-10-CM | POA: Diagnosis not present

## 2020-11-14 DIAGNOSIS — J9601 Acute respiratory failure with hypoxia: Secondary | ICD-10-CM | POA: Diagnosis not present

## 2020-11-14 DIAGNOSIS — R0602 Shortness of breath: Secondary | ICD-10-CM | POA: Diagnosis not present

## 2020-11-14 DIAGNOSIS — I491 Atrial premature depolarization: Secondary | ICD-10-CM | POA: Diagnosis not present

## 2020-11-14 DIAGNOSIS — M1990 Unspecified osteoarthritis, unspecified site: Secondary | ICD-10-CM | POA: Diagnosis not present

## 2020-11-14 DIAGNOSIS — E860 Dehydration: Secondary | ICD-10-CM | POA: Diagnosis not present

## 2020-11-14 DIAGNOSIS — A419 Sepsis, unspecified organism: Secondary | ICD-10-CM | POA: Diagnosis not present

## 2020-11-14 DIAGNOSIS — Z743 Need for continuous supervision: Secondary | ICD-10-CM | POA: Diagnosis not present

## 2020-11-14 DIAGNOSIS — Z7982 Long term (current) use of aspirin: Secondary | ICD-10-CM | POA: Diagnosis not present

## 2020-11-14 DIAGNOSIS — K219 Gastro-esophageal reflux disease without esophagitis: Secondary | ICD-10-CM | POA: Diagnosis not present

## 2020-11-14 DIAGNOSIS — R059 Cough, unspecified: Secondary | ICD-10-CM | POA: Diagnosis not present

## 2020-11-14 DIAGNOSIS — R531 Weakness: Secondary | ICD-10-CM | POA: Diagnosis not present

## 2020-11-14 DIAGNOSIS — N179 Acute kidney failure, unspecified: Secondary | ICD-10-CM | POA: Diagnosis not present

## 2020-11-14 DIAGNOSIS — J440 Chronic obstructive pulmonary disease with acute lower respiratory infection: Secondary | ICD-10-CM | POA: Diagnosis not present

## 2020-11-14 DIAGNOSIS — R0902 Hypoxemia: Secondary | ICD-10-CM | POA: Diagnosis not present

## 2020-11-18 DIAGNOSIS — I10 Essential (primary) hypertension: Secondary | ICD-10-CM | POA: Diagnosis not present

## 2020-11-18 DIAGNOSIS — E7800 Pure hypercholesterolemia, unspecified: Secondary | ICD-10-CM | POA: Diagnosis not present

## 2020-11-18 DIAGNOSIS — K219 Gastro-esophageal reflux disease without esophagitis: Secondary | ICD-10-CM | POA: Diagnosis not present

## 2020-11-18 DIAGNOSIS — E039 Hypothyroidism, unspecified: Secondary | ICD-10-CM | POA: Diagnosis not present

## 2020-11-18 DIAGNOSIS — J9601 Acute respiratory failure with hypoxia: Secondary | ICD-10-CM | POA: Diagnosis not present

## 2020-11-18 DIAGNOSIS — I2510 Atherosclerotic heart disease of native coronary artery without angina pectoris: Secondary | ICD-10-CM | POA: Diagnosis not present

## 2020-11-18 DIAGNOSIS — M1990 Unspecified osteoarthritis, unspecified site: Secondary | ICD-10-CM | POA: Diagnosis not present

## 2020-11-18 DIAGNOSIS — D649 Anemia, unspecified: Secondary | ICD-10-CM | POA: Diagnosis not present

## 2020-11-18 DIAGNOSIS — E860 Dehydration: Secondary | ICD-10-CM | POA: Diagnosis not present

## 2020-11-18 DIAGNOSIS — N179 Acute kidney failure, unspecified: Secondary | ICD-10-CM | POA: Diagnosis not present

## 2020-11-18 DIAGNOSIS — Z87891 Personal history of nicotine dependence: Secondary | ICD-10-CM | POA: Diagnosis not present

## 2020-11-18 DIAGNOSIS — J440 Chronic obstructive pulmonary disease with acute lower respiratory infection: Secondary | ICD-10-CM | POA: Diagnosis not present

## 2020-11-18 DIAGNOSIS — Z8521 Personal history of malignant neoplasm of larynx: Secondary | ICD-10-CM | POA: Diagnosis not present

## 2020-11-19 DIAGNOSIS — E441 Mild protein-calorie malnutrition: Secondary | ICD-10-CM | POA: Diagnosis not present

## 2020-11-19 DIAGNOSIS — J449 Chronic obstructive pulmonary disease, unspecified: Secondary | ICD-10-CM | POA: Diagnosis not present

## 2020-11-19 DIAGNOSIS — Z7689 Persons encountering health services in other specified circumstances: Secondary | ICD-10-CM | POA: Diagnosis not present

## 2020-11-23 DIAGNOSIS — M1990 Unspecified osteoarthritis, unspecified site: Secondary | ICD-10-CM | POA: Diagnosis not present

## 2020-11-23 DIAGNOSIS — E860 Dehydration: Secondary | ICD-10-CM | POA: Diagnosis not present

## 2020-11-23 DIAGNOSIS — J440 Chronic obstructive pulmonary disease with acute lower respiratory infection: Secondary | ICD-10-CM | POA: Diagnosis not present

## 2020-11-23 DIAGNOSIS — D649 Anemia, unspecified: Secondary | ICD-10-CM | POA: Diagnosis not present

## 2020-11-23 DIAGNOSIS — N179 Acute kidney failure, unspecified: Secondary | ICD-10-CM | POA: Diagnosis not present

## 2020-11-23 DIAGNOSIS — E039 Hypothyroidism, unspecified: Secondary | ICD-10-CM | POA: Diagnosis not present

## 2020-11-23 DIAGNOSIS — J9601 Acute respiratory failure with hypoxia: Secondary | ICD-10-CM | POA: Diagnosis not present

## 2020-11-23 DIAGNOSIS — I10 Essential (primary) hypertension: Secondary | ICD-10-CM | POA: Diagnosis not present

## 2020-11-23 DIAGNOSIS — Z8521 Personal history of malignant neoplasm of larynx: Secondary | ICD-10-CM | POA: Diagnosis not present

## 2020-11-23 DIAGNOSIS — I2510 Atherosclerotic heart disease of native coronary artery without angina pectoris: Secondary | ICD-10-CM | POA: Diagnosis not present

## 2020-11-23 DIAGNOSIS — Z87891 Personal history of nicotine dependence: Secondary | ICD-10-CM | POA: Diagnosis not present

## 2020-11-23 DIAGNOSIS — E7800 Pure hypercholesterolemia, unspecified: Secondary | ICD-10-CM | POA: Diagnosis not present

## 2020-11-23 DIAGNOSIS — K219 Gastro-esophageal reflux disease without esophagitis: Secondary | ICD-10-CM | POA: Diagnosis not present

## 2020-11-25 DIAGNOSIS — I10 Essential (primary) hypertension: Secondary | ICD-10-CM | POA: Diagnosis not present

## 2020-11-25 DIAGNOSIS — E860 Dehydration: Secondary | ICD-10-CM | POA: Diagnosis not present

## 2020-11-25 DIAGNOSIS — K219 Gastro-esophageal reflux disease without esophagitis: Secondary | ICD-10-CM | POA: Diagnosis not present

## 2020-11-25 DIAGNOSIS — J9601 Acute respiratory failure with hypoxia: Secondary | ICD-10-CM | POA: Diagnosis not present

## 2020-11-25 DIAGNOSIS — I2510 Atherosclerotic heart disease of native coronary artery without angina pectoris: Secondary | ICD-10-CM | POA: Diagnosis not present

## 2020-11-25 DIAGNOSIS — J440 Chronic obstructive pulmonary disease with acute lower respiratory infection: Secondary | ICD-10-CM | POA: Diagnosis not present

## 2020-11-25 DIAGNOSIS — M1990 Unspecified osteoarthritis, unspecified site: Secondary | ICD-10-CM | POA: Diagnosis not present

## 2020-11-25 DIAGNOSIS — N179 Acute kidney failure, unspecified: Secondary | ICD-10-CM | POA: Diagnosis not present

## 2020-11-25 DIAGNOSIS — Z87891 Personal history of nicotine dependence: Secondary | ICD-10-CM | POA: Diagnosis not present

## 2020-11-25 DIAGNOSIS — E039 Hypothyroidism, unspecified: Secondary | ICD-10-CM | POA: Diagnosis not present

## 2020-11-25 DIAGNOSIS — D649 Anemia, unspecified: Secondary | ICD-10-CM | POA: Diagnosis not present

## 2020-11-25 DIAGNOSIS — Z8521 Personal history of malignant neoplasm of larynx: Secondary | ICD-10-CM | POA: Diagnosis not present

## 2020-11-25 DIAGNOSIS — E7800 Pure hypercholesterolemia, unspecified: Secondary | ICD-10-CM | POA: Diagnosis not present

## 2020-11-26 DIAGNOSIS — M1990 Unspecified osteoarthritis, unspecified site: Secondary | ICD-10-CM | POA: Diagnosis not present

## 2020-11-26 DIAGNOSIS — K219 Gastro-esophageal reflux disease without esophagitis: Secondary | ICD-10-CM | POA: Diagnosis not present

## 2020-11-26 DIAGNOSIS — I10 Essential (primary) hypertension: Secondary | ICD-10-CM | POA: Diagnosis not present

## 2020-11-26 DIAGNOSIS — E7800 Pure hypercholesterolemia, unspecified: Secondary | ICD-10-CM | POA: Diagnosis not present

## 2020-11-26 DIAGNOSIS — N179 Acute kidney failure, unspecified: Secondary | ICD-10-CM | POA: Diagnosis not present

## 2020-11-26 DIAGNOSIS — J440 Chronic obstructive pulmonary disease with acute lower respiratory infection: Secondary | ICD-10-CM | POA: Diagnosis not present

## 2020-11-26 DIAGNOSIS — I2510 Atherosclerotic heart disease of native coronary artery without angina pectoris: Secondary | ICD-10-CM | POA: Diagnosis not present

## 2020-11-26 DIAGNOSIS — E039 Hypothyroidism, unspecified: Secondary | ICD-10-CM | POA: Diagnosis not present

## 2020-11-26 DIAGNOSIS — E860 Dehydration: Secondary | ICD-10-CM | POA: Diagnosis not present

## 2020-11-26 DIAGNOSIS — J9601 Acute respiratory failure with hypoxia: Secondary | ICD-10-CM | POA: Diagnosis not present

## 2020-11-26 DIAGNOSIS — Z8521 Personal history of malignant neoplasm of larynx: Secondary | ICD-10-CM | POA: Diagnosis not present

## 2020-11-26 DIAGNOSIS — Z87891 Personal history of nicotine dependence: Secondary | ICD-10-CM | POA: Diagnosis not present

## 2020-11-26 DIAGNOSIS — D649 Anemia, unspecified: Secondary | ICD-10-CM | POA: Diagnosis not present

## 2020-11-28 DIAGNOSIS — K219 Gastro-esophageal reflux disease without esophagitis: Secondary | ICD-10-CM | POA: Diagnosis not present

## 2020-11-28 DIAGNOSIS — N179 Acute kidney failure, unspecified: Secondary | ICD-10-CM | POA: Diagnosis not present

## 2020-11-28 DIAGNOSIS — I2510 Atherosclerotic heart disease of native coronary artery without angina pectoris: Secondary | ICD-10-CM | POA: Diagnosis not present

## 2020-11-28 DIAGNOSIS — Z8521 Personal history of malignant neoplasm of larynx: Secondary | ICD-10-CM | POA: Diagnosis not present

## 2020-11-28 DIAGNOSIS — E039 Hypothyroidism, unspecified: Secondary | ICD-10-CM | POA: Diagnosis not present

## 2020-11-28 DIAGNOSIS — E7800 Pure hypercholesterolemia, unspecified: Secondary | ICD-10-CM | POA: Diagnosis not present

## 2020-11-28 DIAGNOSIS — I10 Essential (primary) hypertension: Secondary | ICD-10-CM | POA: Diagnosis not present

## 2020-11-28 DIAGNOSIS — D649 Anemia, unspecified: Secondary | ICD-10-CM | POA: Diagnosis not present

## 2020-11-28 DIAGNOSIS — J9601 Acute respiratory failure with hypoxia: Secondary | ICD-10-CM | POA: Diagnosis not present

## 2020-11-28 DIAGNOSIS — E860 Dehydration: Secondary | ICD-10-CM | POA: Diagnosis not present

## 2020-11-28 DIAGNOSIS — M1990 Unspecified osteoarthritis, unspecified site: Secondary | ICD-10-CM | POA: Diagnosis not present

## 2020-11-28 DIAGNOSIS — Z87891 Personal history of nicotine dependence: Secondary | ICD-10-CM | POA: Diagnosis not present

## 2020-11-28 DIAGNOSIS — J440 Chronic obstructive pulmonary disease with acute lower respiratory infection: Secondary | ICD-10-CM | POA: Diagnosis not present

## 2020-11-30 DIAGNOSIS — J9601 Acute respiratory failure with hypoxia: Secondary | ICD-10-CM | POA: Diagnosis not present

## 2020-11-30 DIAGNOSIS — I10 Essential (primary) hypertension: Secondary | ICD-10-CM | POA: Diagnosis not present

## 2020-11-30 DIAGNOSIS — Z8521 Personal history of malignant neoplasm of larynx: Secondary | ICD-10-CM | POA: Diagnosis not present

## 2020-11-30 DIAGNOSIS — M1990 Unspecified osteoarthritis, unspecified site: Secondary | ICD-10-CM | POA: Diagnosis not present

## 2020-11-30 DIAGNOSIS — E039 Hypothyroidism, unspecified: Secondary | ICD-10-CM | POA: Diagnosis not present

## 2020-11-30 DIAGNOSIS — E860 Dehydration: Secondary | ICD-10-CM | POA: Diagnosis not present

## 2020-11-30 DIAGNOSIS — E7800 Pure hypercholesterolemia, unspecified: Secondary | ICD-10-CM | POA: Diagnosis not present

## 2020-11-30 DIAGNOSIS — I2510 Atherosclerotic heart disease of native coronary artery without angina pectoris: Secondary | ICD-10-CM | POA: Diagnosis not present

## 2020-11-30 DIAGNOSIS — D649 Anemia, unspecified: Secondary | ICD-10-CM | POA: Diagnosis not present

## 2020-11-30 DIAGNOSIS — J440 Chronic obstructive pulmonary disease with acute lower respiratory infection: Secondary | ICD-10-CM | POA: Diagnosis not present

## 2020-11-30 DIAGNOSIS — N179 Acute kidney failure, unspecified: Secondary | ICD-10-CM | POA: Diagnosis not present

## 2020-11-30 DIAGNOSIS — K219 Gastro-esophageal reflux disease without esophagitis: Secondary | ICD-10-CM | POA: Diagnosis not present

## 2020-11-30 DIAGNOSIS — Z87891 Personal history of nicotine dependence: Secondary | ICD-10-CM | POA: Diagnosis not present

## 2020-12-02 DIAGNOSIS — M1990 Unspecified osteoarthritis, unspecified site: Secondary | ICD-10-CM | POA: Diagnosis not present

## 2020-12-02 DIAGNOSIS — Z8521 Personal history of malignant neoplasm of larynx: Secondary | ICD-10-CM | POA: Diagnosis not present

## 2020-12-02 DIAGNOSIS — N179 Acute kidney failure, unspecified: Secondary | ICD-10-CM | POA: Diagnosis not present

## 2020-12-02 DIAGNOSIS — E860 Dehydration: Secondary | ICD-10-CM | POA: Diagnosis not present

## 2020-12-02 DIAGNOSIS — J440 Chronic obstructive pulmonary disease with acute lower respiratory infection: Secondary | ICD-10-CM | POA: Diagnosis not present

## 2020-12-02 DIAGNOSIS — K219 Gastro-esophageal reflux disease without esophagitis: Secondary | ICD-10-CM | POA: Diagnosis not present

## 2020-12-02 DIAGNOSIS — E039 Hypothyroidism, unspecified: Secondary | ICD-10-CM | POA: Diagnosis not present

## 2020-12-02 DIAGNOSIS — I10 Essential (primary) hypertension: Secondary | ICD-10-CM | POA: Diagnosis not present

## 2020-12-02 DIAGNOSIS — E7800 Pure hypercholesterolemia, unspecified: Secondary | ICD-10-CM | POA: Diagnosis not present

## 2020-12-02 DIAGNOSIS — D649 Anemia, unspecified: Secondary | ICD-10-CM | POA: Diagnosis not present

## 2020-12-02 DIAGNOSIS — I2510 Atherosclerotic heart disease of native coronary artery without angina pectoris: Secondary | ICD-10-CM | POA: Diagnosis not present

## 2020-12-02 DIAGNOSIS — J9601 Acute respiratory failure with hypoxia: Secondary | ICD-10-CM | POA: Diagnosis not present

## 2020-12-02 DIAGNOSIS — Z87891 Personal history of nicotine dependence: Secondary | ICD-10-CM | POA: Diagnosis not present

## 2020-12-04 DIAGNOSIS — E039 Hypothyroidism, unspecified: Secondary | ICD-10-CM | POA: Diagnosis not present

## 2020-12-04 DIAGNOSIS — J9601 Acute respiratory failure with hypoxia: Secondary | ICD-10-CM | POA: Diagnosis not present

## 2020-12-04 DIAGNOSIS — N179 Acute kidney failure, unspecified: Secondary | ICD-10-CM | POA: Diagnosis not present

## 2020-12-04 DIAGNOSIS — I10 Essential (primary) hypertension: Secondary | ICD-10-CM | POA: Diagnosis not present

## 2020-12-04 DIAGNOSIS — J440 Chronic obstructive pulmonary disease with acute lower respiratory infection: Secondary | ICD-10-CM | POA: Diagnosis not present

## 2020-12-04 DIAGNOSIS — M1990 Unspecified osteoarthritis, unspecified site: Secondary | ICD-10-CM | POA: Diagnosis not present

## 2020-12-04 DIAGNOSIS — D649 Anemia, unspecified: Secondary | ICD-10-CM | POA: Diagnosis not present

## 2020-12-04 DIAGNOSIS — E7800 Pure hypercholesterolemia, unspecified: Secondary | ICD-10-CM | POA: Diagnosis not present

## 2020-12-04 DIAGNOSIS — Z87891 Personal history of nicotine dependence: Secondary | ICD-10-CM | POA: Diagnosis not present

## 2020-12-04 DIAGNOSIS — E860 Dehydration: Secondary | ICD-10-CM | POA: Diagnosis not present

## 2020-12-04 DIAGNOSIS — I2510 Atherosclerotic heart disease of native coronary artery without angina pectoris: Secondary | ICD-10-CM | POA: Diagnosis not present

## 2020-12-04 DIAGNOSIS — K219 Gastro-esophageal reflux disease without esophagitis: Secondary | ICD-10-CM | POA: Diagnosis not present

## 2020-12-04 DIAGNOSIS — Z8521 Personal history of malignant neoplasm of larynx: Secondary | ICD-10-CM | POA: Diagnosis not present

## 2020-12-07 DIAGNOSIS — J449 Chronic obstructive pulmonary disease, unspecified: Secondary | ICD-10-CM | POA: Diagnosis not present

## 2020-12-08 DIAGNOSIS — J440 Chronic obstructive pulmonary disease with acute lower respiratory infection: Secondary | ICD-10-CM | POA: Diagnosis not present

## 2020-12-09 DIAGNOSIS — D649 Anemia, unspecified: Secondary | ICD-10-CM | POA: Diagnosis not present

## 2020-12-09 DIAGNOSIS — E7800 Pure hypercholesterolemia, unspecified: Secondary | ICD-10-CM | POA: Diagnosis not present

## 2020-12-09 DIAGNOSIS — E039 Hypothyroidism, unspecified: Secondary | ICD-10-CM | POA: Diagnosis not present

## 2020-12-09 DIAGNOSIS — J9601 Acute respiratory failure with hypoxia: Secondary | ICD-10-CM | POA: Diagnosis not present

## 2020-12-09 DIAGNOSIS — I2510 Atherosclerotic heart disease of native coronary artery without angina pectoris: Secondary | ICD-10-CM | POA: Diagnosis not present

## 2020-12-09 DIAGNOSIS — N179 Acute kidney failure, unspecified: Secondary | ICD-10-CM | POA: Diagnosis not present

## 2020-12-09 DIAGNOSIS — E860 Dehydration: Secondary | ICD-10-CM | POA: Diagnosis not present

## 2020-12-09 DIAGNOSIS — Z8521 Personal history of malignant neoplasm of larynx: Secondary | ICD-10-CM | POA: Diagnosis not present

## 2020-12-09 DIAGNOSIS — I10 Essential (primary) hypertension: Secondary | ICD-10-CM | POA: Diagnosis not present

## 2020-12-09 DIAGNOSIS — Z87891 Personal history of nicotine dependence: Secondary | ICD-10-CM | POA: Diagnosis not present

## 2020-12-09 DIAGNOSIS — M1990 Unspecified osteoarthritis, unspecified site: Secondary | ICD-10-CM | POA: Diagnosis not present

## 2020-12-09 DIAGNOSIS — J440 Chronic obstructive pulmonary disease with acute lower respiratory infection: Secondary | ICD-10-CM | POA: Diagnosis not present

## 2020-12-09 DIAGNOSIS — K219 Gastro-esophageal reflux disease without esophagitis: Secondary | ICD-10-CM | POA: Diagnosis not present

## 2020-12-11 DIAGNOSIS — J9601 Acute respiratory failure with hypoxia: Secondary | ICD-10-CM | POA: Diagnosis not present

## 2020-12-11 DIAGNOSIS — I2510 Atherosclerotic heart disease of native coronary artery without angina pectoris: Secondary | ICD-10-CM | POA: Diagnosis not present

## 2020-12-11 DIAGNOSIS — J440 Chronic obstructive pulmonary disease with acute lower respiratory infection: Secondary | ICD-10-CM | POA: Diagnosis not present

## 2020-12-11 DIAGNOSIS — N179 Acute kidney failure, unspecified: Secondary | ICD-10-CM | POA: Diagnosis not present

## 2020-12-11 DIAGNOSIS — D649 Anemia, unspecified: Secondary | ICD-10-CM | POA: Diagnosis not present

## 2020-12-11 DIAGNOSIS — E7800 Pure hypercholesterolemia, unspecified: Secondary | ICD-10-CM | POA: Diagnosis not present

## 2020-12-11 DIAGNOSIS — I10 Essential (primary) hypertension: Secondary | ICD-10-CM | POA: Diagnosis not present

## 2020-12-11 DIAGNOSIS — E039 Hypothyroidism, unspecified: Secondary | ICD-10-CM | POA: Diagnosis not present

## 2020-12-11 DIAGNOSIS — K219 Gastro-esophageal reflux disease without esophagitis: Secondary | ICD-10-CM | POA: Diagnosis not present

## 2020-12-11 DIAGNOSIS — M1990 Unspecified osteoarthritis, unspecified site: Secondary | ICD-10-CM | POA: Diagnosis not present

## 2020-12-11 DIAGNOSIS — Z87891 Personal history of nicotine dependence: Secondary | ICD-10-CM | POA: Diagnosis not present

## 2020-12-11 DIAGNOSIS — E860 Dehydration: Secondary | ICD-10-CM | POA: Diagnosis not present

## 2020-12-11 DIAGNOSIS — Z8521 Personal history of malignant neoplasm of larynx: Secondary | ICD-10-CM | POA: Diagnosis not present

## 2020-12-14 DIAGNOSIS — E039 Hypothyroidism, unspecified: Secondary | ICD-10-CM | POA: Diagnosis not present

## 2020-12-14 DIAGNOSIS — J449 Chronic obstructive pulmonary disease, unspecified: Secondary | ICD-10-CM | POA: Diagnosis not present

## 2020-12-14 DIAGNOSIS — I129 Hypertensive chronic kidney disease with stage 1 through stage 4 chronic kidney disease, or unspecified chronic kidney disease: Secondary | ICD-10-CM | POA: Diagnosis not present

## 2020-12-15 ENCOUNTER — Other Ambulatory Visit: Payer: Self-pay

## 2020-12-15 ENCOUNTER — Encounter (HOSPITAL_COMMUNITY): Payer: Self-pay | Admitting: Emergency Medicine

## 2020-12-15 ENCOUNTER — Emergency Department (HOSPITAL_COMMUNITY): Payer: Medicare Other

## 2020-12-15 ENCOUNTER — Inpatient Hospital Stay (HOSPITAL_COMMUNITY)
Admission: EM | Admit: 2020-12-15 | Discharge: 2020-12-25 | DRG: 871 | Disposition: A | Discharge status: Skilled Nursing Facility | Payer: Medicare Other | Attending: Family Medicine | Admitting: Family Medicine

## 2020-12-15 DIAGNOSIS — E162 Hypoglycemia, unspecified: Secondary | ICD-10-CM | POA: Diagnosis not present

## 2020-12-15 DIAGNOSIS — E43 Unspecified severe protein-calorie malnutrition: Secondary | ICD-10-CM | POA: Diagnosis present

## 2020-12-15 DIAGNOSIS — F32A Depression, unspecified: Secondary | ICD-10-CM | POA: Diagnosis present

## 2020-12-15 DIAGNOSIS — R1310 Dysphagia, unspecified: Secondary | ICD-10-CM | POA: Diagnosis not present

## 2020-12-15 DIAGNOSIS — Z79899 Other long term (current) drug therapy: Secondary | ICD-10-CM | POA: Diagnosis not present

## 2020-12-15 DIAGNOSIS — R1314 Dysphagia, pharyngoesophageal phase: Secondary | ICD-10-CM | POA: Diagnosis present

## 2020-12-15 DIAGNOSIS — A419 Sepsis, unspecified organism: Principal | ICD-10-CM | POA: Diagnosis present

## 2020-12-15 DIAGNOSIS — R6520 Severe sepsis without septic shock: Secondary | ICD-10-CM | POA: Diagnosis not present

## 2020-12-15 DIAGNOSIS — Z20822 Contact with and (suspected) exposure to covid-19: Secondary | ICD-10-CM | POA: Diagnosis present

## 2020-12-15 DIAGNOSIS — Z681 Body mass index (BMI) 19 or less, adult: Secondary | ICD-10-CM

## 2020-12-15 DIAGNOSIS — R2689 Other abnormalities of gait and mobility: Secondary | ICD-10-CM | POA: Diagnosis not present

## 2020-12-15 DIAGNOSIS — I739 Peripheral vascular disease, unspecified: Secondary | ICD-10-CM | POA: Diagnosis present

## 2020-12-15 DIAGNOSIS — E870 Hyperosmolality and hypernatremia: Secondary | ICD-10-CM | POA: Diagnosis not present

## 2020-12-15 DIAGNOSIS — I6529 Occlusion and stenosis of unspecified carotid artery: Secondary | ICD-10-CM | POA: Diagnosis not present

## 2020-12-15 DIAGNOSIS — I129 Hypertensive chronic kidney disease with stage 1 through stage 4 chronic kidney disease, or unspecified chronic kidney disease: Secondary | ICD-10-CM | POA: Diagnosis not present

## 2020-12-15 DIAGNOSIS — R0902 Hypoxemia: Secondary | ICD-10-CM | POA: Diagnosis not present

## 2020-12-15 DIAGNOSIS — Z7982 Long term (current) use of aspirin: Secondary | ICD-10-CM | POA: Diagnosis not present

## 2020-12-15 DIAGNOSIS — T148XXA Other injury of unspecified body region, initial encounter: Secondary | ICD-10-CM | POA: Diagnosis not present

## 2020-12-15 DIAGNOSIS — J449 Chronic obstructive pulmonary disease, unspecified: Secondary | ICD-10-CM | POA: Diagnosis not present

## 2020-12-15 DIAGNOSIS — Z923 Personal history of irradiation: Secondary | ICD-10-CM

## 2020-12-15 DIAGNOSIS — Z515 Encounter for palliative care: Secondary | ICD-10-CM | POA: Diagnosis not present

## 2020-12-15 DIAGNOSIS — Z4682 Encounter for fitting and adjustment of non-vascular catheter: Secondary | ICD-10-CM | POA: Diagnosis not present

## 2020-12-15 DIAGNOSIS — N179 Acute kidney failure, unspecified: Secondary | ICD-10-CM | POA: Diagnosis not present

## 2020-12-15 DIAGNOSIS — Z8521 Personal history of malignant neoplasm of larynx: Secondary | ICD-10-CM | POA: Diagnosis not present

## 2020-12-15 DIAGNOSIS — K5900 Constipation, unspecified: Secondary | ICD-10-CM | POA: Diagnosis present

## 2020-12-15 DIAGNOSIS — E039 Hypothyroidism, unspecified: Secondary | ICD-10-CM | POA: Diagnosis not present

## 2020-12-15 DIAGNOSIS — J690 Pneumonitis due to inhalation of food and vomit: Secondary | ICD-10-CM | POA: Diagnosis present

## 2020-12-15 DIAGNOSIS — C159 Malignant neoplasm of esophagus, unspecified: Secondary | ICD-10-CM | POA: Diagnosis not present

## 2020-12-15 DIAGNOSIS — Z7189 Other specified counseling: Secondary | ICD-10-CM | POA: Diagnosis not present

## 2020-12-15 DIAGNOSIS — E876 Hypokalemia: Secondary | ICD-10-CM | POA: Diagnosis present

## 2020-12-15 DIAGNOSIS — Z4659 Encounter for fitting and adjustment of other gastrointestinal appliance and device: Secondary | ICD-10-CM

## 2020-12-15 DIAGNOSIS — R531 Weakness: Secondary | ICD-10-CM | POA: Diagnosis not present

## 2020-12-15 DIAGNOSIS — J189 Pneumonia, unspecified organism: Secondary | ICD-10-CM | POA: Diagnosis not present

## 2020-12-15 DIAGNOSIS — R058 Other specified cough: Secondary | ICD-10-CM | POA: Diagnosis not present

## 2020-12-15 DIAGNOSIS — Z66 Do not resuscitate: Secondary | ICD-10-CM | POA: Diagnosis present

## 2020-12-15 DIAGNOSIS — M6259 Muscle wasting and atrophy, not elsewhere classified, multiple sites: Secondary | ICD-10-CM | POA: Diagnosis not present

## 2020-12-15 DIAGNOSIS — Z79890 Hormone replacement therapy (postmenopausal): Secondary | ICD-10-CM

## 2020-12-15 DIAGNOSIS — Z743 Need for continuous supervision: Secondary | ICD-10-CM | POA: Diagnosis not present

## 2020-12-15 DIAGNOSIS — Z90710 Acquired absence of both cervix and uterus: Secondary | ICD-10-CM | POA: Diagnosis not present

## 2020-12-15 DIAGNOSIS — E785 Hyperlipidemia, unspecified: Secondary | ICD-10-CM | POA: Diagnosis present

## 2020-12-15 DIAGNOSIS — R64 Cachexia: Secondary | ICD-10-CM | POA: Diagnosis present

## 2020-12-15 DIAGNOSIS — C760 Malignant neoplasm of head, face and neck: Secondary | ICD-10-CM | POA: Diagnosis not present

## 2020-12-15 DIAGNOSIS — Z833 Family history of diabetes mellitus: Secondary | ICD-10-CM

## 2020-12-15 DIAGNOSIS — J9601 Acute respiratory failure with hypoxia: Secondary | ICD-10-CM | POA: Diagnosis not present

## 2020-12-15 DIAGNOSIS — R079 Chest pain, unspecified: Secondary | ICD-10-CM | POA: Diagnosis not present

## 2020-12-15 DIAGNOSIS — J432 Centrilobular emphysema: Secondary | ICD-10-CM | POA: Diagnosis not present

## 2020-12-15 DIAGNOSIS — I2699 Other pulmonary embolism without acute cor pulmonale: Secondary | ICD-10-CM | POA: Diagnosis not present

## 2020-12-15 DIAGNOSIS — R634 Abnormal weight loss: Secondary | ICD-10-CM | POA: Diagnosis present

## 2020-12-15 DIAGNOSIS — R339 Retention of urine, unspecified: Secondary | ICD-10-CM | POA: Diagnosis present

## 2020-12-15 DIAGNOSIS — J439 Emphysema, unspecified: Secondary | ICD-10-CM | POA: Diagnosis present

## 2020-12-15 DIAGNOSIS — I2510 Atherosclerotic heart disease of native coronary artery without angina pectoris: Secondary | ICD-10-CM | POA: Diagnosis not present

## 2020-12-15 DIAGNOSIS — J387 Other diseases of larynx: Secondary | ICD-10-CM | POA: Diagnosis not present

## 2020-12-15 DIAGNOSIS — I700 Atherosclerosis of aorta: Secondary | ICD-10-CM | POA: Diagnosis not present

## 2020-12-15 DIAGNOSIS — J9811 Atelectasis: Secondary | ICD-10-CM | POA: Diagnosis not present

## 2020-12-15 DIAGNOSIS — R0602 Shortness of breath: Secondary | ICD-10-CM | POA: Diagnosis not present

## 2020-12-15 DIAGNOSIS — Z931 Gastrostomy status: Secondary | ICD-10-CM | POA: Diagnosis not present

## 2020-12-15 DIAGNOSIS — R911 Solitary pulmonary nodule: Secondary | ICD-10-CM | POA: Diagnosis not present

## 2020-12-15 DIAGNOSIS — R0609 Other forms of dyspnea: Secondary | ICD-10-CM

## 2020-12-15 DIAGNOSIS — R0600 Dyspnea, unspecified: Secondary | ICD-10-CM | POA: Diagnosis not present

## 2020-12-15 DIAGNOSIS — Z431 Encounter for attention to gastrostomy: Secondary | ICD-10-CM | POA: Diagnosis not present

## 2020-12-15 DIAGNOSIS — N261 Atrophy of kidney (terminal): Secondary | ICD-10-CM | POA: Diagnosis not present

## 2020-12-15 DIAGNOSIS — Z8501 Personal history of malignant neoplasm of esophagus: Secondary | ICD-10-CM

## 2020-12-15 DIAGNOSIS — R0789 Other chest pain: Secondary | ICD-10-CM | POA: Diagnosis not present

## 2020-12-15 DIAGNOSIS — R110 Nausea: Secondary | ICD-10-CM | POA: Diagnosis not present

## 2020-12-15 DIAGNOSIS — Z87891 Personal history of nicotine dependence: Secondary | ICD-10-CM

## 2020-12-15 DIAGNOSIS — R54 Age-related physical debility: Secondary | ICD-10-CM | POA: Diagnosis present

## 2020-12-15 DIAGNOSIS — E46 Unspecified protein-calorie malnutrition: Secondary | ICD-10-CM | POA: Diagnosis not present

## 2020-12-15 DIAGNOSIS — N184 Chronic kidney disease, stage 4 (severe): Secondary | ICD-10-CM | POA: Diagnosis not present

## 2020-12-15 DIAGNOSIS — M6281 Muscle weakness (generalized): Secondary | ICD-10-CM | POA: Diagnosis not present

## 2020-12-15 DIAGNOSIS — R636 Underweight: Secondary | ICD-10-CM | POA: Diagnosis not present

## 2020-12-15 LAB — LACTIC ACID, PLASMA
Lactic Acid, Venous: 2 mmol/L (ref 0.5–1.9)
Lactic Acid, Venous: 2.4 mmol/L (ref 0.5–1.9)
Lactic Acid, Venous: 2.1 mmol/L (ref 0.5–1.9)
Lactic Acid, Venous: 1.4 mmol/L (ref 0.5–1.9)

## 2020-12-15 LAB — COMPREHENSIVE METABOLIC PANEL
ALT: 15 U/L (ref 0–44)
AST: 20 U/L (ref 15–41)
Albumin: 3.2 g/dL — ABNORMAL LOW (ref 3.5–5.0)
Alkaline Phosphatase: 57 U/L (ref 38–126)
Anion gap: 14 (ref 5–15)
BUN: 24 mg/dL — ABNORMAL HIGH (ref 8–23)
CO2: 24 mmol/L (ref 22–32)
Calcium: 11.3 mg/dL — ABNORMAL HIGH (ref 8.9–10.3)
Chloride: 106 mmol/L (ref 98–111)
Creatinine, Ser: 1.76 mg/dL — ABNORMAL HIGH (ref 0.44–1.00)
GFR, Estimated: 29 mL/min — ABNORMAL LOW (ref >60)
Glucose, Bld: 100 mg/dL — ABNORMAL HIGH (ref 70–99)
Potassium: 3.3 mmol/L — ABNORMAL LOW (ref 3.5–5.1)
Sodium: 144 mmol/L (ref 135–145)
Total Bilirubin: 1.2 mg/dL (ref 0.3–1.2)
Total Protein: 7.3 g/dL (ref 6.5–8.1)

## 2020-12-15 LAB — CBC WITH DIFFERENTIAL/PLATELET
Abs Immature Granulocytes: 0.02 10*3/uL (ref 0.00–0.07)
Basophils Absolute: 0 10*3/uL (ref 0.0–0.1)
Basophils Relative: 0 %
Eosinophils Absolute: 0.1 10*3/uL (ref 0.0–0.5)
Eosinophils Relative: 2 %
HCT: 30.9 % — ABNORMAL LOW (ref 36.0–46.0)
Hemoglobin: 9.6 g/dL — ABNORMAL LOW (ref 12.0–15.0)
Immature Granulocytes: 0 %
Lymphocytes Relative: 27 %
Lymphs Abs: 2.4 10*3/uL (ref 0.7–4.0)
MCH: 27.1 pg (ref 26.0–34.0)
MCHC: 31.1 g/dL (ref 30.0–36.0)
MCV: 87.3 fL (ref 80.0–100.0)
Monocytes Absolute: 0.8 10*3/uL (ref 0.1–1.0)
Monocytes Relative: 10 %
Neutro Abs: 5.4 10*3/uL (ref 1.7–7.7)
Neutrophils Relative %: 61 %
Platelets: 259 10*3/uL (ref 150–400)
RBC: 3.54 MIL/uL — ABNORMAL LOW (ref 3.87–5.11)
RDW: 13.1 % (ref 11.5–15.5)
WBC: 8.8 10*3/uL (ref 4.0–10.5)
nRBC: 0 % (ref 0.0–0.2)

## 2020-12-15 LAB — D-DIMER, QUANTITATIVE: D-Dimer, Quant: 1.17 ug/mL-FEU — ABNORMAL HIGH (ref 0.00–0.50)

## 2020-12-15 LAB — TSH: TSH: 0.018 u[IU]/mL — ABNORMAL LOW (ref 0.350–4.500)

## 2020-12-15 LAB — SARS CORONAVIRUS 2 (TAT 6-24 HRS): SARS Coronavirus 2: NEGATIVE

## 2020-12-15 MED ORDER — METOPROLOL SUCCINATE ER 25 MG PO TB24
25.0000 mg | ORAL_TABLET | Freq: Every day | ORAL | Status: DC
  Filled 2020-12-15: qty 1

## 2020-12-15 MED ORDER — SODIUM CHLORIDE 0.9 % IV BOLUS
1000.0000 mL | Freq: Once | INTRAVENOUS | Status: AC
  Administered 2020-12-15: 1000 mL via INTRAVENOUS

## 2020-12-15 MED ORDER — SODIUM CHLORIDE 0.9 % IV SOLN
2.0000 g | Freq: Once | INTRAVENOUS | Status: AC
  Administered 2020-12-15: 2 g via INTRAVENOUS
  Filled 2020-12-15: qty 2

## 2020-12-15 MED ORDER — VANCOMYCIN HCL IN DEXTROSE 1-5 GM/200ML-% IV SOLN
1000.0000 mg | Freq: Once | INTRAVENOUS | Status: AC
  Administered 2020-12-15: 1000 mg via INTRAVENOUS
  Filled 2020-12-15: qty 200

## 2020-12-15 MED ORDER — ADULT MULTIVITAMIN W/MINERALS CH
1.0000 | ORAL_TABLET | Freq: Every day | ORAL | Status: DC
  Filled 2020-12-15: qty 1

## 2020-12-15 MED ORDER — SODIUM CHLORIDE 0.9 % IV SOLN
2.0000 g | INTRAVENOUS | Status: DC
  Administered 2020-12-16 – 2020-12-17 (×2): 2 g via INTRAVENOUS
  Filled 2020-12-15 (×2): qty 2

## 2020-12-15 MED ORDER — ACETAMINOPHEN 325 MG PO TABS
650.0000 mg | ORAL_TABLET | Freq: Four times a day (QID) | ORAL | Status: DC | PRN
  Filled 2020-12-15: qty 2

## 2020-12-15 MED ORDER — MIRTAZAPINE 7.5 MG PO TABS
15.0000 mg | ORAL_TABLET | Freq: Every day | ORAL | Status: DC
  Administered 2020-12-15 – 2020-12-18 (×2): 15 mg via ORAL
  Filled 2020-12-15: qty 1
  Filled 2020-12-15: qty 2

## 2020-12-15 MED ORDER — SODIUM CHLORIDE 0.9 % IV SOLN: INTRAVENOUS | Status: DC

## 2020-12-15 MED ORDER — TIOTROPIUM BROMIDE MONOHYDRATE 18 MCG IN CAPS
18.0000 ug | ORAL_CAPSULE | Freq: Every day | RESPIRATORY_TRACT | Status: DC
  Filled 2020-12-15: qty 5

## 2020-12-15 MED ORDER — HEPARIN SODIUM (PORCINE) 5000 UNIT/ML IJ SOLN
5000.0000 [IU] | Freq: Three times a day (TID) | INTRAMUSCULAR | Status: DC
  Administered 2020-12-15 – 2020-12-17 (×7): 5000 [IU] via SUBCUTANEOUS
  Filled 2020-12-15 (×6): qty 1

## 2020-12-15 MED ORDER — LEVOTHYROXINE SODIUM 25 MCG PO TABS
25.0000 ug | ORAL_TABLET | Freq: Every day | ORAL | Status: DC
  Administered 2020-12-16 – 2020-12-19 (×2): 25 ug via ORAL
  Filled 2020-12-15 (×2): qty 1

## 2020-12-15 MED ORDER — ALBUTEROL SULFATE (2.5 MG/3ML) 0.083% IN NEBU
2.5000 mg | INHALATION_SOLUTION | RESPIRATORY_TRACT | Status: DC
  Administered 2020-12-15 – 2020-12-16 (×2): 2.5 mg via RESPIRATORY_TRACT
  Filled 2020-12-15 (×2): qty 3

## 2020-12-15 MED ORDER — VANCOMYCIN VARIABLE DOSE PER UNSTABLE RENAL FUNCTION (PHARMACIST DOSING): Status: DC

## 2020-12-15 MED ORDER — UMECLIDINIUM BROMIDE 62.5 MCG/INH IN AEPB
1.0000 | INHALATION_SPRAY | Freq: Every day | RESPIRATORY_TRACT | Status: DC
  Administered 2020-12-15 – 2020-12-25 (×10): 1 via RESPIRATORY_TRACT
  Filled 2020-12-15 (×2): qty 7

## 2020-12-15 MED ORDER — ACETAMINOPHEN 650 MG RE SUPP: 650.0000 mg | Freq: Four times a day (QID) | RECTAL | Status: DC | PRN

## 2020-12-15 MED ORDER — METHYLPREDNISOLONE SODIUM SUCC 125 MG IJ SOLR
125.0000 mg | Freq: Once | INTRAMUSCULAR | Status: AC
  Administered 2020-12-15: 125 mg via INTRAVENOUS
  Filled 2020-12-15 (×2): qty 2

## 2020-12-15 MED ORDER — POTASSIUM CHLORIDE 20 MEQ PO PACK
40.0000 meq | PACK | Freq: Once | ORAL | Status: AC
  Administered 2020-12-15: 40 meq via ORAL
  Filled 2020-12-15: qty 2

## 2020-12-15 MED ORDER — LEVOTHYROXINE SODIUM 25 MCG PO TABS: 125.0000 ug | ORAL_TABLET | Freq: Every day | ORAL | Status: DC

## 2020-12-15 MED ORDER — IOHEXOL 350 MG/ML SOLN
53.0000 mL | Freq: Once | INTRAVENOUS | Status: AC | PRN
  Administered 2020-12-15: 53 mL via INTRAVENOUS

## 2020-12-15 NOTE — ED Triage Notes (Signed)
Pt arrives via EMS from home for C/o SOB for "a while." Hx of COPD. EMS reports SpO2 on RA 86% They gave albuterol tx in route and placed her on 8 L simple face mask. Pt SpO2 100 at this time. RR is shallow.

## 2020-12-15 NOTE — ED Notes (Signed)
Introduced myself to pt. Pt pulled up in bed and readjusted. Purewick placed. Pt oriented x3. Disoriented to situation. Denies further needs.

## 2020-12-15 NOTE — ED Provider Notes (Signed)
[facility] EMERGENCY DEPARTMENT Provider Note   CSN: [id number] Arrival date & time: 12/15/20  0733     History Chief Complaint  Patient presents with   Shortness of Breath    [name] [name] is a 80 y.o. female.  HPI  80 year old female with past medical history of COPD, HTN, HLD, PVD presents the emergency department shortness of breath.  Report from EMS is that they were called for shortness of breath, she was found to be 86% on room air.  Reported bilateral wheezing, patient was given a breathing treatment in route and required supplemental oxygen briefly.  On arrival patient is on room air.  Minimal historian and prefers for her daughter to give the history.  Daughter states that she was admitted about a month ago with pneumonia.  Since then she has been having increased phlegm production, decreased p.o. intake secondary to nausea/vomiting.  Intermittent loose stools.  No documented fever at home.  Patient denies any ongoing chest pain/back pain/flank pain.  Past Medical History:  Diagnosis Date   COPD (chronic obstructive pulmonary disease) ([facility])    Depression    Exertional dyspnea    HLD (hyperlipidemia)    HTN (hypertension)    Hypothyroidism    PVD (peripheral vascular disease) ([facility])    occluded right subclavian artery. asymptomatic.     Patient Active Problem List   Diagnosis Date Noted   Carotid artery disease ([place]) 08/13/2019   PVD (peripheral vascular disease) ([place])    HTN (hypertension)    ATHEROSLERO NATV ART EXTREM W/INTERMIT CLAUDICAT 12/22/2009    Past Surgical History:  Procedure Laterality Date   hysterectomy - unknown type     THROAT SURGERY       OB History   No obstetric history on file.     History reviewed. No pertinent family history.  Social History   Tobacco Use   Smoking status: Former    Types: Cigarettes    Quit date: 05/16/1994    Years since quitting: 26.6   Smokeless tobacco: Never  Substance Use Topics    Alcohol use: No   Drug use: No    Home Medications Prior to Admission medications   Medication Sig Start Date End Date Taking? Authorizing Provider  amLODipine (NORVASC) 2.5 MG tablet Take 2.5 mg by mouth daily.      [provider]  aspirin 325 MG tablet Take 325 mg by mouth daily.   Patient not taking: Reported on 04/07/2020    [provider]  conjugated estrogens (PREMARIN) vaginal cream Place vaginally once a week.      [provider]  esomeprazole (NEXIUM) 40 MG capsule Take 40 mg by mouth daily before breakfast.      [provider]  levothyroxine (SYNTHROID, LEVOTHROID) 100 MCG tablet Take 100 mcg by mouth daily.      [provider]  loratadine (CLARITIN) 10 MG tablet Take 10 mg by mouth daily.      [provider]  losartan (COZAAR) 100 MG tablet Take 100 mg by mouth daily.      [provider]  ranitidine (ZANTAC) 300 MG tablet Take 300 mg by mouth at bedtime.      [provider]  rosuvastatin (CRESTOR) 40 MG tablet Take 40 mg by mouth daily.      [provider]  tiotropium (SPIRIVA) 18 MCG inhalation capsule Place 18 mcg into inhaler and inhale daily.      [provider]  vitamin B-12 (  CYANOCOBALAMIN) 500 MCG tablet Take 500 mcg by mouth daily.      [provider]    Allergies    Patient has no known allergies.  Review of Systems   Review of Systems  Constitutional:  Positive for fatigue. Negative for chills and fever.  HENT:  Positive for congestion.   Eyes:  Negative for visual disturbance.  Respiratory:  Positive for cough, shortness of breath and wheezing.   Cardiovascular:  Negative for chest pain, palpitations and leg swelling.  Gastrointestinal:  Positive for diarrhea, nausea and vomiting. Negative for abdominal pain.  Genitourinary:  Negative for dysuria and flank pain.  Musculoskeletal:  Negative for back pain.  Skin:  Negative for rash.  Neurological:   Negative for headaches.   Physical Exam Updated Vital Signs BP 117/62   Pulse (!) 109   Temp (!) 97.5 F (36.4 C) (Oral)   Resp (!) 25   Ht 5\' 4"  (1.626 m)   Wt 54.9 kg   SpO2 99%   BMI 20.78 kg/m   Physical Exam Vitals and nursing note reviewed.  Constitutional:      General: She is not in acute distress.    Appearance: Normal appearance.  HENT:     Head: Normocephalic.     Mouth/Throat:     Mouth: Mucous membranes are moist.  Cardiovascular:     Rate and Rhythm: Normal rate.  Pulmonary:     Effort: Tachypnea present. No respiratory distress.     Breath sounds: Examination of the right-middle field reveals wheezing. Examination of the left-middle field reveals wheezing. Examination of the right-lower field reveals decreased breath sounds. Examination of the left-lower field reveals decreased breath sounds. Decreased breath sounds and wheezing present.  Chest:     Chest wall: No tenderness.  Abdominal:     Palpations: Abdomen is soft.     Tenderness: There is no abdominal tenderness.  Musculoskeletal:     Right lower leg: No edema.     Left lower leg: No edema.  Skin:    General: Skin is warm.  Neurological:     Mental Status: She is alert and oriented to person, place, and time. Mental status is at baseline.  Psychiatric:        Mood and Affect: Mood normal.    ED Results / Procedures / Treatments   Labs (all labs ordered are listed, but only abnormal results are displayed) Labs Reviewed  CULTURE, BLOOD (ROUTINE X 2)  CULTURE, BLOOD (ROUTINE X 2)  CBC WITH DIFFERENTIAL/PLATELET  COMPREHENSIVE METABOLIC PANEL  LACTIC ACID, PLASMA  LACTIC ACID, PLASMA    EKG EKG Interpretation  Date/Time:  Tuesday December 15 2020 07:35:13 EDT Ventricular Rate:  123 PR Interval:  115 QRS Duration: 98 QT Interval:  387 QTC Calculation: 554 R Axis:   1 Text Interpretation: Sinus tachycardia Paired ventricular premature complexes Aberrant conduction of SV complex(es)  Abnormal R-wave progression, early transition Repol abnrm suggests ischemia, diffuse leads Prolonged QT interval sinus tachycardia, artifact, no previous Confirmed by Lavenia Atlas [phone]) on 12/15/2020 7:42:17 AM  Radiology DG Chest Port 1 View  Result Date: 12/15/2020 CLINICAL DATA:  Shortness of breath. EXAM: PORTABLE CHEST 1 VIEW COMPARISON:  CT 11/14/2020.  Chest x-ray 11/14/2020. FINDINGS: Mediastinum and hilar structures normal. Heart size stable. Persistent mild left base atelectasis/infiltrate with improved aeration from prior exam. Mild right base subsegmental atelectasis. Calcified right upper lung pulmonary nodule again noted consistent with old calcified granuloma. No pleural effusion or pneumothorax. Mild thoracic  spine scoliosis. No acute bony abnormality. IMPRESSION: Persistent mild left base atelectasis/infiltrate with improved aeration from prior exam. Mild right base subsegmental atelectasis. Electronically Signed   By: Marcello Moores  Register   On: 12/15/2020 08:25    Procedures .Critical Care  Date/Time: 12/15/2020 2:55 PM Performed by: Lorelle Gibbs, DO Authorized by: Lorelle Gibbs, DO   Critical care provider statement:    Critical care time (minutes):  45   Critical care was necessary to treat or prevent imminent or life-threatening deterioration of the following conditions:  Respiratory failure   Critical care was time spent personally by me on the following activities:  Discussions with consultants, evaluation of patient's response to treatment, examination of patient, ordering and performing treatments and interventions, ordering and review of laboratory studies, ordering and review of radiographic studies, pulse oximetry, re-evaluation of patient's condition, obtaining history from patient or surrogate and review of old charts   I assumed direction of critical care for this patient from another provider in my specialty: no     Care discussed with: admitting provider      Medications Ordered in ED Medications  sodium chloride 0.9 % bolus 1,000 mL (1,000 mLs Intravenous New Bag/Given 12/15/20 0833)  methylPREDNISolone sodium succinate (SOLU-MEDROL) 125 mg/2 mL injection 125 mg (125 mg Intravenous Given 12/15/20 0834)    ED Course  I have reviewed the triage vital signs and the nursing notes.  Pertinent labs & imaging results that were available during my care of the patient were reviewed by me and considered in my medical decision making (see chart for details).    MDM Rules/Calculators/A&P                           80 year old female presents the emergency department for evaluation of shortness of breath, hypoxia.  She is tachycardic, tachypneic on arrival with a couple hypotensive blood pressure readings.  She is afebrile.  Blood work shows no leukocytosis but elevated lactic.  She has a mild AKI with a GFR of 29, unclear what her baseline is as we do not have any previous records.  Her previous admission for pneumonia was in [place].  Patient is comfortable on nasal cannula oxygen but still looks very fatigued and deconditioned.  Chest x-ray shows chronic findings, possible infiltrate in the left lower lobe.  Due to tachycardia and hypoxia D-dimer was done which was elevated.  Patient's GFR was 29, our cutoff is 30.  Risks and benefits is discussed with the patient and her family member at bedside, they agree to proceed with the IV dye for a CT PE study.  Procedure consent was signed at bedside.  CT PE study shows no pulmonary embolism but read demonstrates left greater than right lower lung opacity.  Patient's family member reveals that she is having intermittent coughing/choking spells when she swallows which means that this pneumonia could be suspicious for aspiration.  Will cover broadly, blood cultures have been taken.  Vitals are improving with IV hydration.  Patients evaluation and results requires admission for further treatment and care. Patient agrees  with admission plan, offers no new complaints and is stable/unchanged at time of admit.  Final Clinical Impression(s) / ED Diagnoses Final diagnoses:  None    Rx / DC Orders ED Discharge Orders     None        Lorelle Gibbs, DO 12/15/20 1455

## 2020-12-15 NOTE — Progress Notes (Signed)
Pharmacy Antibiotic Note  [name] [name] is a 80 y.o. female admitted on 12/15/2020 with sepsis.  Pharmacy has been consulted for vancomycin and cefepime dosing. Patient arrived with SOB and was 86% on room air, hypotensive responding to NS bolus x2, WBC 8.8, lactic acid 2, probable AKI w/ SCr of 1.76, and recent inpatient admission to [facility] on 7/4 with pneumonia. Due to unknown renal function at baseline, vancomycin variable per pharmacy was ordered.  Plan: Vancomycin 1000 mg IV load Vancomycin variable per pharmacy ordered Cefepime 2g q24h Monitor renal function for dose adjustments. F/u Bcx results to narrow therapy.   Height: 5\' 4"  (162.6 cm) Weight: 54.9 kg (121 lb 0.5 oz) IBW/kg (Calculated) : 54.7  Temp (24hrs), Avg:97.5 F (36.4 C), Min:97.5 F (36.4 C), Max:97.5 F (36.4 C)  Recent Labs  Lab 12/15/20 0828 12/15/20 1237  WBC 8.8  --   CREATININE 1.76*  --   LATICACIDVEN 2.0* 2.4*    Estimated Creatinine Clearance: 21.6 mL/min (A) (by C-G formula based on SCr of 1.76 mg/dL (H)).    No Known Allergies  Antimicrobials this admission: Vancomycin 8/2 >>  Cefepime 8/2 >>   Dose adjustments this admission: Patient has an AKI; continue to monitor renal function for future dose adjustments.  Microbiology results: 8/2 BCx: in process   Thank you for allowing pharmacy to be a part of this patient's care.  Varney Daily, PharmD PGY1 Pharmacy Resident  Please check AMION for all [facility] pharmacy phone numbers After 10:00 PM call main pharmacy [phone]

## 2020-12-15 NOTE — Sepsis Progress Note (Signed)
Elink monitoring code sepsis 

## 2020-12-15 NOTE — H&P (Addendum)
[name] [name] Admission History and Physical Service Pager: [phone]  Patient name: [name] [name] Medical record number: [id number] Date of birth: 10-10-1940 Age: 80 y.o. Gender: female  Primary Care Provider: Maryella Shivers, MD Consultants: None Code Status: DNR, confirmed by patient and daughter Preferred Emergency Contact: Forde Radon, [phone]  Chief Complaint: Shortness of breath  Assessment and Plan: [name] [name] is a 80 y.o. female presenting with shortness of breath. Past medical history significant for COPD (not on oxygen at baseline), hypertension, hyperlipidemia & PVD.   Acute hypoxic respiratory failure likely 2/2 suspected aspiration pneumonia  Severe sepsis  COPD Recent admission in [place] on 11/14/2020-11/16/20 for pneumonia, unsure if it was viral or bacterial, but [name] [name] reports that the patient completed a 10 day course of antibiotics. Improved initially, but has had steady decline over the past few days/weeks since discharge with poor PO intake and decreased functionality.  She arrived via EMS, en route she was 86% on room air with reported bilateral wheezing, given albuterol x1 in route and 8 L nasal cannula supplemental oxygen.  On arrival to the ED, satting at 100% on room air, afebrile, with tachycardia (110s bpm) and tachypnea (20s RR) She was hypotensive in the ED, but fluid responsive to NS bolus x2. Home medications for COPD include spiriva and albuterol inhalers. Patient is not on supplemental oxgyen at baseline.  CTA revealed airpsace opacities in bases L>R; no PE. CXR impression: Persistent mild left base atelectasis/infiltrate with improved aeration from prior exam. Mild right base subsegmental atelectasis. Labs remarkable for: WBC 8.8, Hb 9.6. Lactic acid 2.0-->2.4. D- dimer 1.17 Differential for shortness of breath includes COVID, esophageal cancer recurrence, pneumonia secondary to aspiration vs. unresolved pneumonia  from prior hospitalization, Legionella pneumonia because she has diarrhea but Na+ within normal limits. Most likely aspiration pneumonia given history of esophageal cancer and coughing/choking with food/liquids for the past few weeks. Also consider COPD exacerbation given increased sputum production, coughing in absence of fever. Patient meets sepsis criteria for initial tachycardia (>90bpm), tachypnea (>20rr), and elevated lactic acid (>2.0) with likely pulmonary source.  - admit to progressive, attending Dr. Nori Riis  - continue IV Vancomycin, Cefepime - continue IV solumedrol - Up with assistance - Vitals per floor protocol - Monitor respiratory status, goal oxygen saturations >88% - Wean oxygen as appropriate - SLP consult - PT/OT - Blood cultures x2 - AM CBC, BMP - NPO, sips with meds  - repeat LA - NS @75mL /hr for 24 hours  - f/u legionella urine antigen  - PT/OT evaluation  - cardiac monitoring   AKI Cr 1.76 on admission. BL normal. Suspect pre-renal etiology given decreased PO intake. Patient does not appear to have previous hx of renal disease upon chart review. Patient denies decreased UOP.  - NS @ 75 ml/hr for 24 hours - CMP in am - strict I/O  Protein Calorie Malnutrition, Severe   Patient is cachetic appearing on exam with dry MM. Family reports decreased PO intake in setting of difficulty swallowing. Must also consider potential malignancy contributing to patient's recent onset of dysphagia. Patient's daughter reports persistent weight loss since discharge in July 2022. Patient with muscle wasting on exam. Home meds include mirtazepine 15mg  qHS.  - NPO until SLP evaluation  - NS mIVFs  - will likely need nutritional supplements  - consult to dietician  - continue home mirtazepine  Hypokalemia K+ 3.3 on admission, mild.  - Monitor with labs - will give 25mEq of K replacement, packet  Hypertension Hypotensive on admission, 90s-100s/50s. NS bolus x2. Home meds include  Metoprolol 25mg  daily and Amlodipine 5mg  daily.  -Continue metoprolol -Hold Amlodipine given low BPs and need for fluid resuscitation in ED - monitor BP with VS  Hyperlipidemia Home meds include Rosuvastatin 40mg  daily. -Continue Rosuvastatin daily  Hypothyroidism Home medications include Levothyroxine 147mcg.  - Continue home Levothyroxine 177mcg - f/u repeat TSH   FEN/GI: NPO, sips with meds   Prophylaxis: Heparin 5,000 units q8 hr  Disposition: progressive   History of Present Illness:  [name] [name] is a 80 y.o. female presenting with SOB, increased sputum production. Daughter at bedside and reports that patient was admitted July 2-4th for PNA. She states that the patient has had decreased PO intake since then and has progressively become weaker over the last few weeks. Daughter states that she was living independently alone completing all ADL's until she was discharged. More recently she has been living with her daughter, the daughter has noticed that she becomes easily SOB with ambulating short distances.   Family states she has been filling 16 oz cups with sputum for the last few days. Patient has not had a fever. They deny that patient is on oxygen at baseline. They have an appt scheduled with pulmonology of 12/29/20 for her hx of COPD (spiriva & albuterol inhalers).   Vaccinated for COVID with 2 boosters.   Denies current tobacco use (previously, hx of throat cancer), denies alcohol consumption or illicit drug use.   ED Course:  Patient presented to ED via EMS after becoming hypoxic to 86%, given albuterol treatment in route by EMS. Patient tachycardic in ED, given two fluid boluses.   Review Of Systems: Per HPI with the following additions:   Review of Systems  Constitutional:  Positive for appetite change and unexpected weight change. Negative for fever.  HENT:  Positive for sore throat. Negative for congestion and rhinorrhea.   Respiratory:  Positive for cough and  choking.   Cardiovascular:        Chest tightness   Gastrointestinal:  Positive for diarrhea. Negative for abdominal pain.  Genitourinary:  Negative for decreased urine volume, difficulty urinating and dysuria.  Musculoskeletal:  Positive for back pain.  Neurological:  Positive for dizziness. Negative for headaches.    Patient Active Problem List   Diagnosis Date Noted   Carotid artery disease ([place]) 08/13/2019   PVD (peripheral vascular disease) ([place])    HTN (hypertension)    ATHEROSLERO NATV ART EXTREM W/INTERMIT CLAUDICAT 12/22/2009    Past Medical History: Past Medical History:  Diagnosis Date   COPD (chronic obstructive pulmonary disease) (HCC)    Depression    Exertional dyspnea    HLD (hyperlipidemia)    HTN (hypertension)    Hypothyroidism    PVD (peripheral vascular disease) (HCC)    occluded right subclavian artery. asymptomatic.     Past Surgical History: Past Surgical History:  Procedure Laterality Date   hysterectomy - unknown type     THROAT SURGERY      Social History: Social History   Tobacco Use   Smoking status: Former    Types: Cigarettes    Quit date: 05/16/1994    Years since quitting: 26.6   Smokeless tobacco: Never  Substance Use Topics   Alcohol use: No   Drug use: No    Please also refer to relevant sections of EMR.  Family History: History reviewed. No pertinent family history.  Allergies and Medications: No Known Allergies No current facility-administered medications  on file prior to encounter.   Current Outpatient Medications on File Prior to Encounter  Medication Sig Dispense Refill   albuterol (VENTOLIN HFA) 108 (90 Base) MCG/ACT inhaler Inhale 2 puffs into the lungs every 6 (six) hours as needed.     amLODipine (NORVASC) 5 MG tablet Take 5 mg by mouth daily.     Dextromethorphan-guaiFENesin (WAL-TUSSIN COUGH/CHEST DM MAX PO) Take 20 mLs by mouth every 4 (four) hours.     levothyroxine (SYNTHROID) 125 MCG tablet Take 125 mcg  by mouth daily.     metoprolol succinate (TOPROL-XL) 25 MG 24 hr tablet Take 25 mg by mouth daily.     mirtazapine (REMERON) 15 MG tablet Take 15 mg by mouth at bedtime.     rosuvastatin (CRESTOR) 40 MG tablet Take 40 mg by mouth daily.       tiotropium (SPIRIVA) 18 MCG inhalation capsule Place 18 mcg into inhaler and inhale daily.        Objective: BP (!) 111/57   Pulse 98   Temp (!) 97.5 F (36.4 C) (Oral)   Resp 17   Ht 5\' 4"  (1.626 m)   Wt 54.9 kg   SpO2 98%   BMI 20.78 kg/m   Exam: General: frail appearing, laying in bed in no acute distress, pleasantly conversational, speaking with low volume Eyes: PERRLA, no scleral icterus  ENTM: MMM, mild pharyngeal erythema Neck: Soft, normal ROM Cardiovascular: RRR, no murmurs appreciated Respiratory: No increased work of breathing. Diminished lung sounds throughout. No wheezing, crackles, stridor. Gastrointestinal: Soft, non-tender, non-distended, minimal bowel sounds, no masses appreciated on palpation  MSK:  unable to lean up in bed without assistance, normal ROM of extremities  Derm: No visible rashes or lesions Neuro: Answers questions directly. Alert and oriented  Psych: Normal affect. Good insight and judgement.  Labs and Imaging: CBC BMET  Recent Labs  Lab 12/15/20 0828  WBC 8.8  HGB 9.6*  HCT 30.9*  PLT 259   Recent Labs  Lab 12/15/20 0828  NA 144  K 3.3*  CL 106  CO2 24  BUN 24*  CREATININE 1.76*  GLUCOSE 100*  CALCIUM 11.3*     EKG:  Sinus tachycardia (125bpm), narrow QRS, no QT prolongation  [name] Brill, DO 12/15/2020, 2:37 PM PGY-1, [place] Intern pager: [phone], text pages welcome   [place] Upper-Level Resident Addendum   I have independently interviewed and examined the patient. I have discussed the above with Dr.Dameron and agree with the documented plan. My edits for correction/addition/clarification are included above in blue. Please see any attending notes for  additional recommendations.   [name] Foster, MD PGY-3, [facility] 12/15/2020 5:54 PM  [place] Service pager: [phone] (text pages welcome through [place])

## 2020-12-15 NOTE — Hospital Course (Addendum)
[name] [name] is a 80 y.o. female who presented with SOB. Patient was found to be hypoxic as low as 86% CXR on admission showed mild left base atelectasis/infiltrate with mild right base subsegmental atelectasis. Chest CTA was negative for pulmonary embolism, but evident for emphysema and bilateral airspace opacities consistent with infection or aspiration.    Acute hypoxic respiratory failure 2/2 pneumonia  Severe sepsis In the ED, she met SIRS criteria and code sepsis was called (Met SIRS criteria: tachycardia (>90bpm), tachypnea (>20 resp/min) and elevated lactic acid (>2.0). Also notably had procalcitonin of 5.97. She received NS bolus x2 for hypotension.  She was started on IV vancomycin and cefepime. MRSA swab was negative therefore discontinued vancomycin.  Patient was de-escalated on broad spectrum antibiotics and continued to receive IV antibiotics until PEG tube placement and then transition to oral Augmentin via PEG tube. Patient received total of 10-day treatment course of antibiotics ending on 12/25/2020.  Patient does not have any oxygen requirements on discharge.  Protein calorie malnutrition, severe Patient appeared cachectic with muscle wasting.  Swallow study revealed severe pharyngeal dysphagia, with aspiration after swallow.  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  Palliative care and RD were consulted.  Patient was ultimately determined to require PEG tube for feeding.  PEG tube was placed and feedings were continued through there. Mirtazepine discontinued as patient would regularly receive feeds and this was prescribed to increase appetite.  PCP Follow-up recommendations Consider discontinuation of mirtazepine permanently

## 2020-12-16 ENCOUNTER — Inpatient Hospital Stay (HOSPITAL_COMMUNITY): Payer: Medicare Other

## 2020-12-16 DIAGNOSIS — R0600 Dyspnea, unspecified: Secondary | ICD-10-CM

## 2020-12-16 DIAGNOSIS — R64 Cachexia: Secondary | ICD-10-CM | POA: Diagnosis not present

## 2020-12-16 DIAGNOSIS — R0902 Hypoxemia: Secondary | ICD-10-CM | POA: Diagnosis not present

## 2020-12-16 DIAGNOSIS — A419 Sepsis, unspecified organism: Principal | ICD-10-CM

## 2020-12-16 DIAGNOSIS — E43 Unspecified severe protein-calorie malnutrition: Secondary | ICD-10-CM | POA: Diagnosis present

## 2020-12-16 DIAGNOSIS — R634 Abnormal weight loss: Secondary | ICD-10-CM

## 2020-12-16 LAB — CBC
HCT: 26.4 % — ABNORMAL LOW (ref 36.0–46.0)
Hemoglobin: 8.6 g/dL — ABNORMAL LOW (ref 12.0–15.0)
MCH: 27.9 pg (ref 26.0–34.0)
MCHC: 32.6 g/dL (ref 30.0–36.0)
MCV: 85.7 fL (ref 80.0–100.0)
Platelets: 236 10*3/uL (ref 150–400)
RBC: 3.08 MIL/uL — ABNORMAL LOW (ref 3.87–5.11)
RDW: 13.1 % (ref 11.5–15.5)
WBC: 6.9 10*3/uL (ref 4.0–10.5)
nRBC: 0 % (ref 0.0–0.2)

## 2020-12-16 LAB — MRSA NEXT GEN BY PCR, NASAL: MRSA by PCR Next Gen: NOT DETECTED

## 2020-12-16 LAB — COMPREHENSIVE METABOLIC PANEL
ALT: 15 U/L (ref 0–44)
AST: 15 U/L (ref 15–41)
Albumin: 2.7 g/dL — ABNORMAL LOW (ref 3.5–5.0)
Alkaline Phosphatase: 42 U/L (ref 38–126)
Anion gap: 10 (ref 5–15)
BUN: 22 mg/dL (ref 8–23)
CO2: 21 mmol/L — ABNORMAL LOW (ref 22–32)
Calcium: 9.9 mg/dL (ref 8.9–10.3)
Chloride: 114 mmol/L — ABNORMAL HIGH (ref 98–111)
Creatinine, Ser: 1.32 mg/dL — ABNORMAL HIGH (ref 0.44–1.00)
GFR, Estimated: 41 mL/min — ABNORMAL LOW (ref >60)
Glucose, Bld: 120 mg/dL — ABNORMAL HIGH (ref 70–99)
Potassium: 4.4 mmol/L (ref 3.5–5.1)
Sodium: 145 mmol/L (ref 135–145)
Total Bilirubin: 0.7 mg/dL (ref 0.3–1.2)
Total Protein: 6.3 g/dL — ABNORMAL LOW (ref 6.5–8.1)

## 2020-12-16 LAB — TROPONIN I (HIGH SENSITIVITY)
Troponin I (High Sensitivity): 9 ng/L (ref <18)
Troponin I (High Sensitivity): 9 ng/L (ref <18)

## 2020-12-16 MED ORDER — SODIUM CHLORIDE 0.9 % IV SOLN: INTRAVENOUS | Status: DC

## 2020-12-16 MED ORDER — ALBUTEROL SULFATE (2.5 MG/3ML) 0.083% IN NEBU: 2.5000 mg | INHALATION_SOLUTION | RESPIRATORY_TRACT | Status: DC | PRN

## 2020-12-16 MED ORDER — ALBUTEROL SULFATE (2.5 MG/3ML) 0.083% IN NEBU
2.5000 mg | INHALATION_SOLUTION | RESPIRATORY_TRACT | Status: DC
  Administered 2020-12-16: 2.5 mg via RESPIRATORY_TRACT
  Filled 2020-12-16: qty 3

## 2020-12-16 MED ORDER — ENSURE ENLIVE PO LIQD: 237.0000 mL | Freq: Three times a day (TID) | ORAL | Status: DC

## 2020-12-16 MED ORDER — ALBUTEROL SULFATE (2.5 MG/3ML) 0.083% IN NEBU
2.5000 mg | INHALATION_SOLUTION | Freq: Four times a day (QID) | RESPIRATORY_TRACT | Status: DC
  Administered 2020-12-16 – 2020-12-17 (×4): 2.5 mg via RESPIRATORY_TRACT
  Filled 2020-12-16 (×4): qty 3

## 2020-12-16 MED ORDER — METOPROLOL TARTRATE 12.5 MG HALF TABLET: 12.5000 mg | ORAL_TABLET | Freq: Two times a day (BID) | ORAL | Status: DC

## 2020-12-16 MED ORDER — PHENOL 1.4 % MT LIQD
1.0000 | OROMUCOSAL | Status: DC | PRN
  Administered 2020-12-17: 1 via OROMUCOSAL
  Filled 2020-12-16: qty 177

## 2020-12-16 NOTE — Progress Notes (Addendum)
Interim Progress Note  S: Received message from RN stating that patient has a new complaint of chest pain.  EKG was obtained.  She was hypertensive to 203/100, HR 110s.  Manual BP checked and still hypertensive to 196/90.  In to see the patient, two RN's at bedside. Patient c/o central, 7/10 chest pain but unable to describe more, "I can't say whether it is sharp or stabbing".  Denied shortness of breath, nausea.  Unclear if patient has had similar pain in the past.  She denies history of reflux. No radiation of pain.   O:  Blood pressure (!) 167/92, pulse (!) 107, temperature 98.7 F (37.1 C), temperature source Oral, resp. rate (!) 22, height 5\' 4"  (1.626 m), weight 44.3 kg, SpO2 98 %. Gen: Chronically ill appearing female sitting up in bed, in mild distress, soft-spoken, cachectic Cardio: Tachycardic, regular rhythm, BP improved to 150's/80's. Resp: diminished lung sounds throughout, no wheezing, rhonchi or rales appreciated Extremities: No edema    A/P: Chest Pain New and concerning for ACS given acute onset and association with hypertensive episode. No neurological deficits concerning for stroke at this time.  EKG reviewed and without ischemic changes noted.  While in the room the pain spontaneously subsided without intervention. This may be falsely reassuring. We will continue work up with troponin and chest x-ray.  -Portable chest x-ray -High-sensitivity troponins x2 -Night team to check in on patient overnight  Hypertension She had been ranging [id number] systolic for most of the day.  With this new onset of chest pain, her systolic blood pressure rose to 203. Of note, she has not received her 12.5 mg of metoprolol BID today given her inability to swallow.  Given spontaneous improvement of her blood pressures with resolution of her chest pain, will continue to monitor for now. Would want to avoid hypotension as she has been hypotensive this admission to systolics 32'N. Can consider adding  IV metoprolol if needed.

## 2020-12-16 NOTE — Progress Notes (Signed)
   12/16/20 1745  Assess: MEWS Score  BP (!) 167/92  Pulse Rate (!) 107  ECG Heart Rate (!) 107  Resp (!) 22  SpO2 98 %  Assess: MEWS Score  MEWS Temp 0  MEWS Systolic 0  MEWS Pulse 1  MEWS RR 1  MEWS LOC 0  MEWS Score 2  MEWS Score Color Yellow  Assess: if the MEWS score is Yellow or Red  Were vital signs taken at a resting state? Yes  Focused Assessment No change from prior assessment  Early Detection of Sepsis Score *See Row Information* Medium  MEWS guidelines implemented *See Row Information* Yes  Treat  MEWS Interventions Other (Comment) (MD to bedside)  Pain Scale 0-10  Pain Score 7  Pain Type Acute pain  Pain Location Chest  Pain Orientation Right  Take Vital Signs  Increase Vital Sign Frequency  Yellow: Q 2hr X 2 then Q 4hr X 2, if remains yellow, continue Q 4hrs  Escalate  MEWS: Escalate Yellow: discuss with charge nurse/RN and consider discussing with provider and RRT  Notify: Charge Nurse/RN  Name of Charge Nurse/RN Notified [place] RN  Date Charge Nurse/RN Notified 12/16/20  Time Charge Nurse/RN Notified 1610  Notify: Provider  Provider Name/Title Sharion Settler, DO  Date Provider Notified 12/16/20  Time Provider Notified 1745  Notification Type Face-to-face  Notification Reason Change in status  Provider response At bedside;See new orders  Date of Provider Response 12/16/20  Time of Provider Response [id number]  Document  Patient Outcome Stabilized after interventions  Progress note created (see row info) Yes

## 2020-12-16 NOTE — Progress Notes (Signed)
Initial Nutrition Assessment  DOCUMENTATION CODES:  Severe malnutrition in context of chronic illness, Underweight  INTERVENTION:  Continue to advance diet as medically able. Recommend regular diet once advanced.  Add Vital Cuisine Shake BID, each supplement provides 520 kcal and 22 grams of protein.  Add Ensure Enlive po TID, each supplement provides 350 kcal and 20 grams of protein.  Add Magic cup TID with meals, each supplement provides 290 kcal and 9 grams of protein.  Continue MVI with minerals daily.  NUTRITION DIAGNOSIS:  Severe Malnutrition related to chronic illness (COPD) as evidenced by percent weight loss, severe fat depletion, severe muscle depletion.  GOAL:  Patient will meet greater than or equal to 90% of their needs  MONITOR:  Diet advancement, PO intake, Supplement acceptance, Labs, Weight trends, I & O's  REASON FOR ASSESSMENT:  Malnutrition Screening Tool    ASSESSMENT:  80 yo female with a PMH of COPD (not on O2 at baseline), HTN, HLD, and PVD who presents with aspiration PNA. Daughter at bedside and reports that patient was admitted July 2-4th for PNA. She states that the patient has had decreased PO intake since then and has progressively become weaker over the last few weeks.  Spoke with pt at bedside. She reports that she has a had a very poor appetite for a long time now. She was unable to give much other history during the time of RD visit. She was unsure of how long her poor appetite had been going on. She was able to report that she likes to drink Ensure, and prefers either chocolate or vanilla.  Per Epic, pt has lost ~23 lbs (19.3%) in the past 8.5 months, which is significant and severe for the time frame.  On exam, pt is severely malnourished in the chronic setting.  Recommend adding Hormel shakes BID, Ensure Enlive TID, Magic Cup TID, and continue MVI with minerals daily.  Medications: reviewed; Synthroid, Remeron, MVI with minerals, IVF @ 75  ml/hr, cefepime via IV  Labs: reviewed; Glucose 120 (H)  NUTRITION - FOCUSED PHYSICAL EXAM: Flowsheet Row Most Recent Value  Orbital Region Severe depletion  Upper Arm Region Severe depletion  Thoracic and Lumbar Region Severe depletion  Buccal Region Severe depletion  Temple Region Severe depletion  Clavicle Bone Region Severe depletion  Clavicle and Acromion Bone Region Severe depletion  Scapular Bone Region Severe depletion  Dorsal Hand Severe depletion  Patellar Region Severe depletion  Anterior Thigh Region Severe depletion  Posterior Calf Region Severe depletion  Edema (RD Assessment) None  Hair Reviewed  Eyes Reviewed  Mouth Reviewed  Skin Reviewed  Nails Reviewed   Diet Order:   Diet Order             Diet NPO time specified Except for: Sips with Meds  Diet effective now                  EDUCATION NEEDS:  Education needs have been addressed  Skin:  Skin Assessment: Reviewed RN Assessment  Last BM:  unknown  Height:  Ht Readings from Last 1 Encounters:  12/15/20 5\' 4"  (1.626 m)   Weight:  Wt Readings from Last 1 Encounters:  12/16/20 44.3 kg   BMI:  Body mass index is 16.76 kg/m.  Estimated Nutritional Needs:  Kcal:  1900-2100 Protein:  65-80 grams Fluid:  >1.9 L  Derrel Nip, RD, LDN (she/her/hers) Registered Dietitian I After-Hours/Weekend Pager # in [place]

## 2020-12-16 NOTE — TOC Progression Note (Addendum)
Transition of Care [facility]) - Progression Note    Patient Details  Name: Lashe Oliveira MRN: [id number] Date of Birth: 10/06/1940  Transition of [facility]) CM/SW Contact  Zenon Mayo, RN Phone Number: 12/16/2020, 2:52 PM  Clinical Narrative:    NCM spoke with patient and daughter at the bedside, offered choice and the daughter states they are active with [facility] for [facility], [place].  NCM contacted Sharyn Lull with [facility] to confirm.  Informed Sharyn Lull that we need to add HHOT .  She states they will add HHOT to her services.  She lives with her daughter,  she has a rolling walker and incentive spirometer at home.  NCM asked daughter if they want the 3 n 1 and shower stool. She states they will just go with the 3 n 1 .  NCM made referral to [facility] with [employer] for the 3 n 1. This will be brought to patient room prior to dc.        Expected Discharge Plan and Services                                                 Social Determinants of Health (SDOH) Interventions    Readmission Risk Interventions No flowsheet data found.

## 2020-12-16 NOTE — Progress Notes (Signed)
OT Cancellation Note  Patient Details Name: [name] [name] MRN: [id number] DOB: [birth year]   Cancelled Treatment:    Reason Eval/Treat Not Completed: Patient at procedure or test/ unavailable Pt leaving unit for MBS. Will follow-up for OT eval as schedule permits.   Layla Maw 12/16/2020, 11:40 AM

## 2020-12-16 NOTE — Progress Notes (Signed)
FPTS Interim Night Progress Note  S:Patient sleeping comfortably.  Rounded with primary night RN.  No concerns voiced.  No orders required.    O: Today's Vitals   12/15/20 2211 12/15/20 2228 12/15/20 2300 12/16/20 0000  BP:  (!) 164/73 (!) 149/69 126/72  Pulse:    79  Resp:  19  (!) 21  Temp:  98.1 F (36.7 C)    TempSrc:  Oral    SpO2:  98%    Weight:  44.3 kg    Height:  5\' 4"  (1.626 m)    PainSc: 0-No pain 0-No pain        A/P: MRSA PCR negative, consider d/c vancomycin. LA now at 1.4, continue IVF at 75/hr Monitor respiratory status Maintain O2 sats >92%  Carollee Leitz MD PGY-3, [place] Medicine Service pager [phone]

## 2020-12-16 NOTE — TOC Progression Note (Addendum)
Transition of Care [facility]) - Progression Note    Patient Details  Name: [name] [name] MRN: [id number] Date of Birth: 09-Jan-1941  Transition of Care [facility]) CM/SW Contact  Zenon Mayo, RN Phone Number: 12/16/2020, 3:11 PM  Clinical Narrative:    NCM spoke with patient and daughter , [name] [phone] at the bedside, offered choice and the daughter states they are active with [facility] for [facility], [place].  NCM contacted Sharyn Lull with [facility] to confirm.  Informed Sharyn Lull that we need to add HHOT .  She states they will add HHOT to her services.  She lives with her daughter,  she has a rolling walker and incentive spirometer at home.  NCM asked daughter if they want the 3 n 1 and shower stool. She states they will just go with the 3 n 1 .  NCM made referral to [facility] with [facility] for the 3 n 1. This will be brought to patient room prior to dc.     Expected Discharge Plan: [place] Barriers to Discharge: Continued Medical Work up  Expected Discharge Plan and Services Expected Discharge Plan: [place]   Discharge Planning Services: CM Consult Post Acute Care Choice: Home Health, Durable Medical Equipment Living arrangements for the past 2 months: Single Family Home                 DME Arranged: 3-N-1 DME Agency: [employer] Date DME Agency Contacted: 12/16/20 Time DME Agency Contacted: [phone] Representative spoke with at DME Agency: [place] Arranged: RN, PT, OT [place] Agency: [place] Date [place]: 12/16/20 Time [place]: [id number] Representative spoke with at [place]: [place] ([place]) Interventions    Readmission Risk Interventions No flowsheet data found.

## 2020-12-16 NOTE — Evaluation (Signed)
Clinical/Bedside Swallow Evaluation Patient Details  Name: [name] [name] MRN: [id number] Date of Birth: 18-Jan-1941  Today's Date: 12/16/2020 Time: SLP Start Time (ACUTE ONLY): 108 SLP Stop Time (ACUTE ONLY): 1006 SLP Time Calculation (min) (ACUTE ONLY): 21 min  Past Medical History:  Past Medical History:  Diagnosis Date   COPD (chronic obstructive pulmonary disease) (HCC)    Depression    Exertional dyspnea    HLD (hyperlipidemia)    HTN (hypertension)    Hypothyroidism    PVD (peripheral vascular disease) ([place])    occluded right subclavian artery. asymptomatic.    Past Surgical History:  Past Surgical History:  Procedure Laterality Date   hysterectomy - unknown type     THROAT SURGERY     HPI:  Pt is an 80 yo female presenting with SOB. Pt found to have severe sepsis from suspected aspiration PNA. She was recently admitted to another hospital 7/2-7/4 for PNA and per family report has had increased phlegm production and decreased PO intake due to N/V since that time. PMH also includes: throat ca s/p XRT and surgery, COPD, exertional dyspnea, HLD, HTN, hypothyroidism, PVD   Assessment / Plan / Recommendation Clinical Impression  Pt reports changes in vocal quality and swallowing that she reports as more "recent" changes, although she has a difficult time sharing what timeline this may be. She does believe that she has been having trouble at least since she was in the hospital for PNA earlier this summer, and that more recently she has been regurgitating any solids - even if her family is mashing them. Her voice is very hoarse at baseline today and she appears to work more effortfully to swallow even single ice chips or spoonfuls of water, after which she coughs and expectorates what appears to be mucus. Would maintain NPO status and proceed with MBS, tentatively scheduled for later today. SLP Visit Diagnosis: Dysphagia, unspecified (R13.10)    Aspiration Risk  Moderate aspiration  risk;Risk for inadequate nutrition/hydration    Diet Recommendation NPO   Medication Administration: Via alternative means    Other  Recommendations Oral Care Recommendations: Oral care QID Other Recommendations: Have oral suction available   Follow up Recommendations  (tba)      Frequency and Duration            Prognosis Prognosis for Safe Diet Advancement: Fair Barriers to Reach Goals: Severity of deficits      Swallow Study   General HPI: Pt is an 80 yo female presenting with SOB. Pt found to have severe sepsis from suspected aspiration PNA. She was recently admitted to another hospital 7/2-7/4 for PNA and per family report has had increased phlegm production and decreased PO intake due to N/V since that time. PMH also includes: throat ca s/p XRT and surgery, COPD, exertional dyspnea, HLD, HTN, hypothyroidism, PVD Type of Study: Bedside Swallow Evaluation Previous Swallow Assessment: none in chart Diet Prior to this Study: NPO Temperature Spikes Noted: No Respiratory Status: Nasal cannula History of Recent Intubation: No Behavior/Cognition: Alert;Cooperative;Pleasant mood Oral Cavity Assessment: Excessive secretions Oral Care Completed by SLP: No Oral Cavity - Dentition: Dentures, top;Dentures, bottom Self-Feeding Abilities: Needs assist Patient Positioning: Upright in chair Baseline Vocal Quality: Hoarse Volitional Cough: Strong Volitional Swallow: Able to elicit    Oral/Motor/Sensory Function Overall Oral Motor/Sensory Function: Generalized oral weakness (also without uvula)   Ice Chips Ice chips: Impaired Presentation: Spoon Pharyngeal Phase Impairments: Multiple swallows;Cough - Immediate   Thin Liquid Thin Liquid: Impaired Presentation: Spoon Pharyngeal  Phase Impairments: Multiple swallows;Cough - Immediate    Nectar Thick Nectar Thick Liquid: Not tested   Honey Thick Honey Thick Liquid: Not tested   Puree Puree: Not tested   Solid     Solid: Not tested       Osie Bond., M.A. [place] [facility] Pager [phone] Office [phone]  12/16/2020,10:15 AM

## 2020-12-16 NOTE — Evaluation (Signed)
Physical Therapy Evaluation Patient Details Name: [name] [name] MRN: [id number] DOB: 01/10/1941 Today's Date: 12/16/2020   History of Present Illness  Pt is an 80 y.o. female presenting to ED 8/2 with shortness of breath. Admitted for acute hypoxic respiratory failure likely 2/2 suspected aspiration pneumonia and severe sepsis. Past medical history significant for COPD (not on oxygen at baseline), hypertension, hyperlipidemia & PVD.   Clinical Impression  Pt admitted with above diagnosis. PTA pt living at home with daughter, mod I mobility with RW. On eval, she required min assist bed mobility, min assist transfers, and min assist ambulation 5' with RW. Mobility limited by fatigue and weakness. Of note, pt NPO at time of eval. On 2L O2 with SpO2 96% at rest. Mobilized on RA with desat to 91%. 2L replaced in recliner with improvement to 97%. Pt currently with functional limitations due to the deficits listed below (see PT Problem List). Pt will benefit from skilled PT to increase their independence and safety with mobility to allow discharge to the venue listed below.  Pt reports daughter is able to provide needed level of assist. Per pt, she does have 5 steps with L rail to enter house.     Follow Up Recommendations Home health PT;Supervision/Assistance - 24 hour    Equipment Recommendations  3in1 (PT)    Recommendations for Other Services       Precautions / Restrictions Precautions Precautions: Fall      Mobility  Bed Mobility Overal bed mobility: Needs Assistance Bed Mobility: Supine to Sit     Supine to sit: Min assist;HOB elevated     General bed mobility comments: +rail, increased time    Transfers Overall transfer level: Needs assistance Equipment used: Rolling walker (2 wheeled) Transfers: Sit to/from Stand Sit to Stand: Min assist Stand pivot transfers: Min assist       General transfer comment: cues for hand placement, assist to power  up  Ambulation/Gait Ambulation/Gait assistance: Min assist Gait Distance (Feet): 5 Feet Assistive device: Rolling walker (2 wheeled) Gait Pattern/deviations: Step-through pattern;Decreased stride length Gait velocity: decreased   General Gait Details: min assist to maintain balance and manage RW, distance limited by weakness and fatigue  Stairs            Wheelchair Mobility    Modified Rankin (Stroke Patients Only)       Balance Overall balance assessment: Needs assistance Sitting-balance support: No upper extremity supported;Feet supported Sitting balance-Leahy Scale: Good     Standing balance support: Bilateral upper extremity supported;During functional activity Standing balance-Leahy Scale: Poor Standing balance comment: reliant on external support                             Pertinent Vitals/Pain Pain Assessment: No/denies pain    Home Living Family/patient expects to be discharged to:: Private residence Living Arrangements: Children (daughter) Available Help at Discharge: Family;Available 24 hours/day Type of Home: House Home Access: Stairs to enter Entrance Stairs-Rails: Left Entrance Stairs-Number of Steps: 5 Home Layout: One level Home Equipment: Walker - 2 wheels      Prior Function Level of Independence: Needs assistance   Gait / Transfers Assistance Needed: amb household distances with RW mod I. Daughter assists up/down stairs.  ADL's / Homemaking Assistance Needed: daughter assists with ADLs, as needed  Comments: Pt has been living with her daughter since June 2022 (when she was sick with covid). Prior to that time, pt lived alone independent.  Hand Dominance        Extremity/Trunk Assessment   Upper Extremity Assessment Upper Extremity Assessment: Defer to OT evaluation    Lower Extremity Assessment Lower Extremity Assessment: Generalized weakness    Cervical / Trunk Assessment Cervical / Trunk Assessment:  Kyphotic  Communication   Communication: No difficulties (soft, hoarse voice. Pt with c/o sore throat. NPO at time of eval.)  Cognition Arousal/Alertness: Awake/alert Behavior During Therapy: WFL for tasks assessed/performed Overall Cognitive Status: Within Functional Limits for tasks assessed                                        General Comments General comments (skin integrity, edema, etc.): SpO2 96% on 2L at rest on arrival. Mobilized on RA with desat to 91%. 2L replaced in recliner with increase to 97%.    Exercises     Assessment/Plan    PT Assessment Patient needs continued PT services  PT Problem List Decreased strength;Decreased mobility;Decreased activity tolerance;Cardiopulmonary status limiting activity;Decreased balance       PT Treatment Interventions DME instruction;Therapeutic activities;Gait training;Therapeutic exercise;Patient/family education;Balance training;Stair training;Functional mobility training    PT Goals (Current goals can be found in the Care Plan section)  Acute Rehab PT Goals Patient Stated Goal: home PT Goal Formulation: With patient Time For Goal Achievement: 12/30/20 Potential to Achieve Goals: Good    Frequency Min 3X/week   Barriers to discharge        Co-evaluation               AM-PAC PT "6 Clicks" Mobility  Outcome Measure Help needed turning from your back to your side while in a flat bed without using bedrails?: A Little Help needed moving from lying on your back to sitting on the side of a flat bed without using bedrails?: A Little Help needed moving to and from a bed to a chair (including a wheelchair)?: A Little Help needed standing up from a chair using your arms (e.g., wheelchair or bedside chair)?: A Little Help needed to walk in hospital room?: A Little Help needed climbing 3-5 steps with a railing? : A Little 6 Click Score: 18    End of Session Equipment Utilized During Treatment: Gait  belt;Oxygen Activity Tolerance: Patient tolerated treatment well Patient left: in chair;with chair alarm set;with call bell/phone within reach Nurse Communication: Mobility status PT Visit Diagnosis: Muscle weakness (generalized) (M62.81);Difficulty in walking, not elsewhere classified (R26.2)    Time: 3016-0109 PT Time Calculation (min) (ACUTE ONLY): 23 min   Charges:   PT Evaluation $PT Eval Moderate Complexity: 1 Mod PT Treatments $Gait Training: 8-22 mins        Lorrin Goodell, PT  Office # [phone] Pager [phone]   Lorriane Shire 12/16/2020, 9:18 AM

## 2020-12-16 NOTE — Progress Notes (Signed)
FPTS Brief Progress Note  S: Patient sleeping soundly during night rounds.  Discussed care with nurse.  No further reports of chest pain since earlier.  Had 1 high BP reading but this was in the setting of a extended coughing fit.  Patient complaining of mild sore throat.   O: BP (!) 151/70 (BP Location: Left Arm)   Pulse 87   Temp 98.2 F (36.8 C) (Oral)   Resp (!) 22   Ht 5\' 4"  (1.626 m)   Wt 44.3 kg   SpO2 96%   BMI 16.76 kg/m   General: Sleeping in hospital bed Pulm: Breathing comfortably on 2 L nasal cannula  A/P: Maintaining sats on 2 L [place].  CT soft tissue neck pending.  Continue plan per day team -Chloraseptic spray for throat -Follow-up CT results -Follow-up Legionella urine antigen - Orders reviewed. Labs for AM ordered, which was adjusted as needed.   Eppie Gibson, MD 12/16/2020, 11:08 PM PGY-1, [facility] Night Resident  Please page [phone] with questions.

## 2020-12-16 NOTE — Evaluation (Signed)
Occupational Therapy Evaluation Patient Details Name: [name] [name] MRN: [id number] DOB: Aug 20, 1940 Today's Date: 12/16/2020    History of Present Illness Pt is an 80 y.o. female presenting to ED 8/2 with shortness of breath. Admitted for acute hypoxic respiratory failure likely 2/2 suspected aspiration pneumonia and severe sepsis. Past medical history significant for COPD (not on oxygen at baseline), hypertension, hyperlipidemia & PVD.   Clinical Impression   PTA, pt lives with daughter and reports Modified Independence with ADLs/mobility using RW though daughter does provide supervision for showering tasks. Pt overall min guard for short mobility using RW, Setup for UB ADLs and min guard for LB ADLs due to deficits noted below. When asked about SOB with activity, pt reports "no more than normal". Recommend HHOT follow-up at DC, as well as consideration of shower chair for energy conservation strategies. Will progress standing tolerance with ADLs and instruct in UE HEP in next sessions.  Pt received on 3 L O2, does not wear O2 at baseline. On RA, questionable desat to 88% with toileting tasks with 1/4 DOE noted. At rest on RA, SpO2 >94%.     Follow Up Recommendations  Home health OT;Supervision - Intermittent    Equipment Recommendations  Tub/shower seat    Recommendations for Other Services       Precautions / Restrictions Precautions Precautions: Fall Precaution Comments: monitor O2 Restrictions Weight Bearing Restrictions: No      Mobility Bed Mobility Overal bed mobility: Needs Assistance Bed Mobility: Supine to Sit;Sit to Supine     Supine to sit: Min assist;HOB elevated Sit to supine: Min guard   General bed mobility comments: Light Min A to lift trunk and increased time. min guard to guide LEs back into bed    Transfers Overall transfer level: Needs assistance Equipment used: Rolling walker (2 wheeled) Transfers: Sit to/from [facility] Sit to  Stand: Min guard Stand pivot transfers: Min guard       General transfer comment: min guard for sit to stand with RW, increased time for power up. Min guard for safety in turning to toilet, minor cues for manuevering RW    Balance Overall balance assessment: Needs assistance Sitting-balance support: No upper extremity supported;Feet supported Sitting balance-Leahy Scale: Good     Standing balance support: Bilateral upper extremity supported;During functional activity Standing balance-Leahy Scale: Poor Standing balance comment: reliant on at least one UE support                           ADL either performed or assessed with clinical judgement   ADL Overall ADL's : Needs assistance/impaired Eating/Feeding: Independent;Sitting   Grooming: Min guard;Standing   Upper Body Bathing: Sitting;Set up   Lower Body Bathing: Min guard;Sit to/from stand   Upper Body Dressing : Set up;Sitting   Lower Body Dressing: Min guard;Sit to/from stand   Toilet Transfer: Min guard;Ambulation;Regular Toilet;RW Toilet Transfer Details (indicate cue type and reason): min guard for safety, minor cues for DME manuevering. No LOB Toileting- Clothing Manipulation and Hygiene: Min guard;Sit to/from stand;Sitting/lateral lean Toileting - Clothing Manipulation Details (indicate cue type and reason): able to complete hygiene with lateral leans, min guard for clothing mgmt in standin g     Functional mobility during ADLs: Min guard;Rolling walker;Cueing for sequencing;Cueing for safety General ADL Comments: Pt with minor deficits in endurance and strength. SpO2 with desats on RA and recovers well with seated rest break.     Vision Patient Visual Report:  No change from baseline Vision Assessment?: No apparent visual deficits     Perception     Praxis      Pertinent Vitals/Pain Pain Assessment: No/denies pain Faces Pain Scale: No hurt     Hand Dominance Right   Extremity/Trunk  Assessment Upper Extremity Assessment Upper Extremity Assessment: Generalized weakness   Lower Extremity Assessment Lower Extremity Assessment: Defer to PT evaluation   Cervical / Trunk Assessment Cervical / Trunk Assessment: Kyphotic   Communication Communication Communication: No difficulties (soft spoken)   Cognition Arousal/Alertness: Awake/alert Behavior During Therapy: WFL for tasks assessed/performed Overall Cognitive Status: Within Functional Limits for tasks assessed                                     General Comments  SpO2 99% on 3 L O2. Pt does not wear O2 at baseline, questionable desat to 88% on RA with bathroom mobility though pt reports not feeling any more SOB than she usually does. 94% at rest on RA. Collab with RN who requests at least 1 L O2 be placed back on pt for continued monitoring    Exercises     Shoulder Instructions      Home Living Family/patient expects to be discharged to:: Private residence Living Arrangements: Children;Other (Comment) (31 y/o granddaughter) Available Help at Discharge: Family;Available 24 hours/day Type of Home: House Home Access: Stairs to enter [employer] of Steps: 5 Entrance Stairs-Rails: Left Home Layout: Two level;Able to live on main level with bedroom/bathroom     Bathroom Shower/Tub: Teacher, early years/pre: Standard     Home Equipment: Environmental consultant - 2 wheels   Additional Comments: daughter is retired      Prior Functioning/Environment Level of Independence: Needs assistance  Gait / Transfers Assistance Needed: amb household distances with RW mod I. Daughter assists up/down stairs. ADL's / Homemaking Assistance Needed: reports Modified Independence with ADLs aside from supervision for tub transfers/showering tasks. Daughter completes iADLs   Comments: Pt has been living with her daughter since June 2022 (when she was sick with covid). Prior to that time, pt lived alone  independent.        OT Problem List: Decreased strength;Decreased activity tolerance;Impaired balance (sitting and/or standing);Cardiopulmonary status limiting activity      OT Treatment/Interventions: Self-care/ADL training;Therapeutic exercise;Energy conservation;DME and/or AE instruction;Therapeutic activities;Patient/family education;Balance training    OT Goals(Current goals can be found in the care plan section) Acute Rehab OT Goals Patient Stated Goal: go whatever I need to get better OT Goal Formulation: With patient Time For Goal Achievement: 12/30/20 Potential to Achieve Goals: Good  OT Frequency: Min 2X/week   Barriers to D/C:            Co-evaluation              AM-PAC OT "6 Clicks" Daily Activity     Outcome Measure Help from another person eating meals?: A Little (currently NPO though anticipate no difficulties with self feeding) Help from another person taking care of personal grooming?: A Little Help from another person toileting, which includes using toliet, bedpan, or urinal?: A Little Help from another person bathing (including washing, rinsing, drying)?: A Little Help from another person to put on and taking off regular upper body clothing?: A Little Help from another person to put on and taking off regular lower body clothing?: A Little 6 Click Score: 18   End of  Session Equipment Utilized During Treatment: Gait belt;Rolling walker;Oxygen Nurse Communication: Mobility status;Other (comment) (O2)  Activity Tolerance: Patient tolerated treatment well Patient left: in bed;with call bell/phone within reach  OT Visit Diagnosis: Unsteadiness on feet (R26.81);Muscle weakness (generalized) (M62.81)                Time: 6088-8358 OT Time Calculation (min): 20 min Charges:  OT General Charges $OT Visit: 1 Visit OT Evaluation $OT Eval Low Complexity: 1 Low  Malachy Chamber, OTR/L Acute Rehab Services Office: [phone]   Layla Maw 12/16/2020, 1:57 PM

## 2020-12-16 NOTE — Progress Notes (Signed)
Modified Barium Swallow Progress Note  Patient Details  Name: [name] [name] MRN: [id number] Date of Birth: [birth year]  Today's Date: 12/16/2020  Modified Barium Swallow completed.  Full report located under Chart Review in the Imaging Section.  Brief recommendations include the following:  Clinical Impression  Pt has a severe pharyngeal dysphagia with minimal entrance into the cervical esophagus. She has signifnicantly impaired mobility throughout her pharynx and larynx, with changes that could be consistent with post-radiation dysphagia. Little to no movement is noted despite cues to swallow as effortfully as possible. She aspirates during the swallow as she cannot obtain full airway closure. The UES has very little opening, so that only trace amounts of boluses can enter the esophagus, leaving the remainder of the bolus in the pharynx. This is also aspirated after the swallow as she tries to perform subswallows to clear it, but pt also had to expectorate the majority of what she was given. She can bring this anteriorly in her oral cavity, but also needs help clearing it from her mouth, as it appeasr to be mixed with very thick secretions that were potentially in her pharynx originally. Suspect that pt it likely to have a more chronic dysphagia that may have been more acutely exacerbated by recent illness and deconditioning, although note that MD w/u is still underway to determine if there could be any other acute causes. Given the concern for persistent swallowing difficulties as well as the severity of her dysphagia, recommend palliative care consult.   Swallow Evaluation Recommendations   SLP Diet Recommendations: NPO    Medication Administration: Via alternative means     Oral Care Recommendations: Oral care QID   Other Recommendations: Have oral suction available    Osie Bond., M.A. [place] Pager [phone] Office [phone]  12/16/2020,1:55 PM

## 2020-12-16 NOTE — Progress Notes (Addendum)
Family Medicine Teaching Service Daily Progress Note Intern Pager: [phone]  Patient name: [name] [name] Medical record number: [id number] Date of birth: 08-Aug-1940 Age: 80 y.o. Gender: female  Primary Care Provider: Maryella Shivers, MD Consultants: None Code Status: DNR, confirmed by patient and daughter  Pt Overview and Major Events to Date:  [name] [name] is an 80 year old female admitted on 8/2 who presented with shortness of breath.  Past medical history significant for COPD (not on oxygen at baseline MRSA, hypertension, hyperlipidemia and PVD.   Assessment and Plan:  Severe Sepsis Likely source is lung infection, unknown organism.  MRSA nasal swab negative.  Could be underlying component of chronic aspiration pneumonitis given her history.  Remained afebrile overnight heart rate 90s to 100s, intermittent episodes of tachypnea 21-20 respirations/minute.  Blood pressures overnight 110s-140s/60s-70s, maintaining MAP > 65.  WBC 8.8 --> 6.9.  Hemoglobin 9.6 --> 8.6 -Discontinue IV vancomycin given negative MRSA swab  -Continue IV cefepime for broad coverage -Continue IV Solu-Medrol -NS at 75 mL/HR for 24 hours -Tylenol as needed for pain, fever -Follow-up blood cultures x2 -Follow-up Legionella urine antigen -Up with assistance -As per floor protocol -PT OT -AM CBC  Acute Hypoxic Respiratory Failure 2/2 pneumonia Unknown organism, may be more of aspiration pneumonitis Recent admission 1 month ago for pneumonia, unknown if viral, bacterial or aspiration.  Treated for pneumonia with a 10-day course of antibiotics.  Also considering COPD exacerbation given increased sputum production, coughing and absence of fever.  Patient still endorsing sputum production and cough. -Treating broad-spectrum with IV vancomycin and cefepime -Currently on 2 L / 28% FiO2 [place].  Supplemental oxygen, goal saturations greater than 88%.   AKI, Prerenal, Currently CKD 4 Unknown baseline, but creatinine  downtrending since admission 1.32 this morning, 1.76 yesterday.  BUN 24 ---> 22. -NS at 75 mL/HR  -CMP in a.m. -Strict I's/O  Protein Calorie Malnutrition, Severe Albumin downtrending, 2.7 this morning from 3.2 yesterday.  Still considering potential malignancy contributing to patient's recent onset of dysphagia, although alk phos 42 and AST ALT within normal limits..  She is cachectic appearing on exam with dry mucous membranes and muscle wasting.  SLP swallow study revealed severe pharyngeal dysphagia, with aspiration after the swallow.  Suspect that it is likely more chronic dysphagia, with changes consistent with postradiation dysphagia, that is acutely exacerbated by recent illness and ED conditioning.  Given concern for persistent swallowing difficulties and severity of dysphagia, palliative care consult was recommended. -Remain NPO per SLP diet recommendations -Palliative care consult -Registered dietitian consult -Continue home mirtazapine  Hypokalemia (resolved)  K+ 3.3 on admission, 4.4 this morning. -Monitor with labs, replete as necessary  Hypertension Hypotensive on admission, blood pressures overnight 110s-140s/60s-70s, maintaining MAP > 65. -Changed to extended release metoprolol to twice daily immediate release metoprolol so that she is able to crush it and swallow. -Holding home amlodipine given low blood pressures and need for fluid resuscitation ED -Monitor blood pressure with vital sign  Hyperlipidemia Home meds include rosuvastatin 40 mg daily -Rosuvastatin daily  Hypothyroidism TSH 0.018.  Home medications include levothyroxine 125 mcg -Adjusted dosage to levothyroxine 25 mcg given low TSH   FEN/GI: NPO, sips with meds PPx: Heparin 5,000 unitsq8h Dispo:Pending PT recommendations  pending clinical improvement . Barriers include oxygen requirement, mobility.   Subjective:  [name] [name] feels okay this morning. Still endorses a cough.  No chest pain, dysuria,  diarrhea, vomiting.  She says her mouth is very dry and would like some water.  Objective: Temp:  [97.6  F (36.4 C)-98.3 F (36.8 C)] 98.2 F (36.8 C) (08/03 0741) Pulse Rate:  [79-116] 85 (08/03 0758) Resp:  [15-28] 18 (08/03 0758) BP: (67-168)/(40-113) 139/65 (08/03 0741) SpO2:  [93 %-100 %] 99 % (08/03 0758) FiO2 (%):  [28 %] 28 % (08/03 0758) Weight:  [44.3 kg] 44.3 kg (08/03 0500) Physical Exam: General: Frail appearing, sitting upright in no acute distress, pleasantly conversation, speaks in low volume Cardiovascular: RRR, no murmurs appreciated Respiratory: No increased work of breathing. Decent aeration throughout lung fields, improved slightly from yesterday. No wheezing, crackles, stridor. Abdomen: Soft, non-tender, non-distended, no masses appreciated Extremities: Warm, dry, no visible rashes or lesions. Well perfused  Laboratory: Recent Labs  Lab 12/15/20 0828 12/16/20 0029  WBC 8.8 6.9  HGB 9.6* 8.6*  HCT 30.9* 26.4*  PLT 259 236   Recent Labs  Lab 12/15/20 0828 12/16/20 0029  NA 144 145  K 3.3* 4.4  CL 106 114*  CO2 24 21*  BUN 24* 22  CREATININE 1.76* 1.32*  CALCIUM 11.3* 9.9  PROT 7.3 6.3*  BILITOT 1.2 0.7  ALKPHOS 57 42  ALT 15 15  AST 20 15  GLUCOSE 100* 120*      Imaging/Diagnostic Tests: SLP swallow study-severe pharyngeal dysphagia Pending CT neck soft tissue  Orvis Brill, DO 12/16/2020, 8:09 AM PGY-1, [place] Intern pager: [phone], text pages welcomeop

## 2020-12-17 ENCOUNTER — Inpatient Hospital Stay (HOSPITAL_COMMUNITY): Payer: Medicare Other

## 2020-12-17 DIAGNOSIS — R0902 Hypoxemia: Secondary | ICD-10-CM

## 2020-12-17 DIAGNOSIS — Z7189 Other specified counseling: Secondary | ICD-10-CM

## 2020-12-17 DIAGNOSIS — Z515 Encounter for palliative care: Secondary | ICD-10-CM

## 2020-12-17 DIAGNOSIS — A419 Sepsis, unspecified organism: Secondary | ICD-10-CM | POA: Diagnosis not present

## 2020-12-17 DIAGNOSIS — J690 Pneumonitis due to inhalation of food and vomit: Secondary | ICD-10-CM | POA: Diagnosis not present

## 2020-12-17 LAB — COMPREHENSIVE METABOLIC PANEL
ALT: 15 U/L (ref 0–44)
AST: 15 U/L (ref 15–41)
Albumin: 2.5 g/dL — ABNORMAL LOW (ref 3.5–5.0)
Alkaline Phosphatase: 44 U/L (ref 38–126)
Anion gap: 12 (ref 5–15)
BUN: 22 mg/dL (ref 8–23)
CO2: 19 mmol/L — ABNORMAL LOW (ref 22–32)
Calcium: 9.9 mg/dL (ref 8.9–10.3)
Chloride: 114 mmol/L — ABNORMAL HIGH (ref 98–111)
Creatinine, Ser: 1.47 mg/dL — ABNORMAL HIGH (ref 0.44–1.00)
GFR, Estimated: 36 mL/min — ABNORMAL LOW (ref >60)
Glucose, Bld: 71 mg/dL (ref 70–99)
Potassium: 3.8 mmol/L (ref 3.5–5.1)
Sodium: 145 mmol/L (ref 135–145)
Total Bilirubin: 1 mg/dL (ref 0.3–1.2)
Total Protein: 5.8 g/dL — ABNORMAL LOW (ref 6.5–8.1)

## 2020-12-17 LAB — GLUCOSE, CAPILLARY
Glucose-Capillary: 122 mg/dL — ABNORMAL HIGH (ref 70–99)
Glucose-Capillary: 67 mg/dL — ABNORMAL LOW (ref 70–99)
Glucose-Capillary: 74 mg/dL (ref 70–99)
Glucose-Capillary: 86 mg/dL (ref 70–99)

## 2020-12-17 MED ORDER — METOPROLOL TARTRATE 5 MG/5ML IV SOLN
2.5000 mg | Freq: Once | INTRAVENOUS | Status: AC
  Administered 2020-12-17: 2.5 mg via INTRAVENOUS
  Filled 2020-12-17: qty 5

## 2020-12-17 MED ORDER — ALBUTEROL SULFATE (2.5 MG/3ML) 0.083% IN NEBU: 2.5000 mg | INHALATION_SOLUTION | Freq: Two times a day (BID) | RESPIRATORY_TRACT | Status: DC

## 2020-12-17 MED ORDER — DEXTROSE-NACL 5-0.9 % IV SOLN: INTRAVENOUS | Status: DC

## 2020-12-17 MED ORDER — IOHEXOL 350 MG/ML SOLN
75.0000 mL | Freq: Once | INTRAVENOUS | Status: AC | PRN
  Administered 2020-12-17: 75 mL via INTRAVENOUS

## 2020-12-17 MED ORDER — METOPROLOL TARTRATE 5 MG/5ML IV SOLN
5.0000 mg | Freq: Four times a day (QID) | INTRAVENOUS | Status: DC
  Administered 2020-12-18 (×4): 5 mg via INTRAVENOUS
  Filled 2020-12-17 (×4): qty 5

## 2020-12-17 MED ORDER — ENOXAPARIN SODIUM 30 MG/0.3ML IJ SOSY
30.0000 mg | PREFILLED_SYRINGE | INTRAMUSCULAR | Status: DC
  Administered 2020-12-17 – 2020-12-19 (×3): 30 mg via SUBCUTANEOUS
  Filled 2020-12-17 (×3): qty 0.3

## 2020-12-17 MED ORDER — AMLODIPINE BESYLATE 5 MG PO TABS: 5.0000 mg | ORAL_TABLET | Freq: Every day | ORAL | Status: DC

## 2020-12-17 MED ORDER — ALBUTEROL SULFATE (2.5 MG/3ML) 0.083% IN NEBU
2.5000 mg | INHALATION_SOLUTION | Freq: Two times a day (BID) | RESPIRATORY_TRACT | Status: DC
  Administered 2020-12-17 – 2020-12-19 (×5): 2.5 mg via RESPIRATORY_TRACT
  Filled 2020-12-17 (×6): qty 3

## 2020-12-17 MED ORDER — METOPROLOL TARTRATE 5 MG/5ML IV SOLN: 2.5000 mg | Freq: Two times a day (BID) | INTRAVENOUS | Status: DC

## 2020-12-17 MED ORDER — METOPROLOL TARTRATE 5 MG/5ML IV SOLN
2.5000 mg | Freq: Four times a day (QID) | INTRAVENOUS | Status: DC
  Administered 2020-12-17 (×2): 2.5 mg via INTRAVENOUS
  Filled 2020-12-17 (×2): qty 5

## 2020-12-17 NOTE — Progress Notes (Signed)
FPTS Interim Night Progress Note  S:Patient sleeping comfortably.  Rounded with primary night RN. Bladder scanned for 600cc and voided 400cc. No orders required.    O: Today's Vitals   12/16/20 2009 12/16/20 2200 12/16/20 2300 12/17/20 0300  BP:   (!) 153/66 129/66  Pulse:   89 85  Resp:   18 20  Temp:   98.4 F (36.9 C) 98.2 F (36.8 C)  TempSrc:   Oral Oral  SpO2: 96%  98% 98%  Weight:      Height:      PainSc:  0-No pain        A/P: Continue current management  Carollee Leitz MD PGY-3, [place] Medicine Service pager [phone]

## 2020-12-17 NOTE — Consult Note (Signed)
Palliative Care Consult Note                                  Date: 12/17/2020   Patient Name: [name] [name]  DOB: 1940-06-15  MRN: [id number]  Age / Sex: 80 y.o., female  PCP: Maryella Shivers, MD Referring Physician: Dickie La, MD  Reason for Consultation: Establishing goals of care  HPI/Patient Profile: 80 y.o. female  with past medical history of COPD (not on oxygen at baseline), hypertension, hyperlipidemia, PVD, esophageal cancer s/p radiation treatment who was admitted on 12/15/2020 with shortness of breath. She was found to have pneumonia (favored aspiration, possible legionella or unresolved previous pna).  Being treated for acute hypoxic respiratory failure, which she is currently stable on IV antibiotic regimen to cover for aspiration pneumonia.  Blood culture with no growth for 2 days.  Met sepsis criteria initially.  During her hospitalization it was noted that she has dysphagia and severe malnutrition.  CT of the soft tissues of the neck was ordered to rule out other causes.    Speech-language pathology was consulted and found severe pharyngeal dysphagia with minimal entrance in the cervical esophagus, impaired mobility throughout the pharynx and larynx with changes that could be consistent with postradiation dysphagia.  Noted little to no movement despite cues and noted aspiration during swallow.  Based on these findings, recommended n.p.o. and palliative care consult.  Past Medical History:  Diagnosis Date  . COPD (chronic obstructive pulmonary disease) ([place])   . Depression   . Exertional dyspnea   . HLD (hyperlipidemia)   . HTN (hypertension)   . Hypothyroidism   . PVD (peripheral vascular disease) ([facility])    occluded right subclavian artery. asymptomatic.     Social History   Socioeconomic History  . Marital status: Single    Spouse name: Not on file  . Number of children: 1  . Years of education: Not on file  .  Highest education level: Not on file  Occupational History  . Occupation: retired  Tobacco Use  . Smoking status: Former    Types: Cigarettes    Quit date: 05/16/1994    Years since quitting: 26.6  . Smokeless tobacco: Never  Substance and Sexual Activity  . Alcohol use: No  . Drug use: No  . Sexual activity: Not on file  Other Topics Concern  . Not on file  Social History Narrative  . Not on file   Social Determinants of Health   Financial Resource Strain: Not on file  Food Insecurity: Not on file  Transportation Needs: Not on file  Physical Activity: Not on file  Stress: Not on file  Social Connections: Not on file    History reviewed. No pertinent family history.  Subjective:   This NP Walden Field reviewed medical records, received report from team, assessed the patient and then meet at the patient's bedside with the patient, her daughter [name] [name], and her son-in-law [name] [name] to discuss diagnosis, prognosis, GOC, EOL wishes disposition and options.   Concept of Palliative Care was introduced as specialized medical care for people and their families living with serious illness.  If focuses on providing relief from the symptoms and stress of a serious illness.  The goal is to improve quality of life for both the patient and the family. Values and goals of care important to patient and family were attempted to be elicited.  Created space and opportunity for  patient  and family to explore thoughts and feelings regarding current medical situation   Natural trajectory and expectations at EOL were discussed. Questions and concerns addressed. Patient  encouraged to call with questions or concerns.    Life Review: The patient was described by her daughter as a "weight woman who never complains and never complains about pain.  States she is happy and loves to laugh.  She often tells jokes.  She worked until she retired at age 5, mostly in [employer].  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  Her hobbies include sewing, solitaire, and word finder.  They note that the patient lives alone with a [place] mix.  She has her 1 daughter, [name] [name].  She is not currently married and no other children.  Patient Values: Primarily family and God.  She verbalizes that they would appreciate chaplain consult for spiritual care.  She also greatly values independence and her ability to be independent.  Patient/Family Understanding of Illness: The family states that they do not know much about what is going on.  They were told by the nurse that she is having swallowing problems.  We fully discussed the patient's care until now including likely aspiration pneumonia related to severe dysphagia.  We discussed her poor oral intake, cachexia, malnutrition.  We discussed that a CT of the neck has been completed to look for possible treatable causes or other explanation.  Otherwise, the pathophysiology of age-related dysmotility/dysphagia and radiation-induced dysphagia was discussed.  We talked about her pneumonia and sepsis causing significant illness and they agree that the patient is very weak.  We talked about the need to think about and discuss whether the patient would consider a feeding tube, if she is a candidate.  It was decided no decisions were needed today as a CT of the neck is still pending.  However, this would likely be the first decision that would need to be made.  The patient previously had a feeding tube while she was undergoing esophageal cancer treatment.  When the idea of a feeding tube was mentioned to the patient she seemed to not be very satisfied with this.  Further discussions to follow.  Review of Systems  Constitutional:  Positive for activity change and appetite change.  Respiratory:  Negative for cough and shortness of breath.   Cardiovascular:  Negative for chest pain.   Gastrointestinal:  Negative for abdominal pain.   Objective:   Primary Diagnoses: Present on Admission: . Aspiration pneumonia ([facility])   Scheduled Meds: . albuterol  2.5 mg Inhalation BID  . enoxaparin (LOVENOX) injection  30 mg Subcutaneous Q24H  . feeding supplement  237 mL Oral TID BM  . levothyroxine  25 mcg Oral Daily  . metoprolol tartrate  2.5 mg Intravenous Q6H  . mirtazapine  15 mg Oral QHS  . umeclidinium bromide  1 puff Inhalation Daily    Continuous Infusions: . ceFEPime (MAXIPIME) IV Stopped (12/17/20 1600)  . dextrose 5 % and 0.9% NaCl 85 mL/hr at 12/17/20 0837    PRN Meds: acetaminophen **OR** acetaminophen, albuterol, phenol  No Known Allergies  Physical Exam Vitals and nursing note reviewed.  Constitutional:      Appearance: She is cachectic. She is ill-appearing.  HENT:     Head: Normocephalic and atraumatic.  Pulmonary:     Effort: Pulmonary effort is normal. No respiratory distress.  Abdominal:  General: Abdomen is flat.     Palpations: Abdomen is soft.  Skin:    General: Skin is warm and dry.  Neurological:     Mental Status: She is alert and oriented to person, place, and time.  Psychiatric:        Behavior: Behavior normal.    Vital Signs:  BP (!) 167/70 (BP Location: Left Arm)   Pulse 82   Temp (!) 97.5 F (36.4 C) (Oral)   Resp 20   Ht 5' 4"  (1.626 m)   Wt 42.9 kg   SpO2 98%   BMI 16.23 kg/m  Pain Scale: 0-10   Pain Score: 0-No pain  SpO2: SpO2: 98 % O2 Device:SpO2: 98 % O2 Flow Rate: .O2 Flow Rate (L/min): 2 L/min  IO: Intake/output summary:  Intake/Output Summary (Last 24 hours) at 12/17/2020 1623 Last data filed at 12/17/2020 1600 Gross per 24 hour  Intake 636.5 ml  Output 1025 ml  Net -388.5 ml    LBM: Last BM Date:  (unknown) Baseline Weight: Weight: 54.9 kg Most recent weight: Weight: 42.9 kg      Palliative Assessment/Data: 20%   Advanced Care Planning:   Primary Decision Maker: PATIENT  Code  Status/Advance Care Planning: DNR  A discussion was had today regarding advanced directives. Concepts specific to code status, artifical feeding and hydration, continued IV antibiotics and rehospitalization was had.  The difference between a aggressive medical intervention path and a palliative comfort care path for this patient at this time was had. No decisions today, but important to continue ongoing conversations about goals.  Decisions/Changes to ACP: Remain DNR Further decisions pending CT results  Assessment & Plan:   I have reviewed the medical record, interviewed the patient and family, and examined the patient. The following aspects are pertinent.  Impression: Very frail, cachectic appearing 80 year old female with a history of esophageal cancer status postradiation therapy who was admitted for her second pneumonia admission and is many months.  Overall most likely favors aspiration pneumonia.  Found to have severe dysphagia and severe malnutrition with decreased oral intake over the past several weeks.  Her albumin is quite low.  She does appear quite thin and cachectic supporting her malnutrition diagnosis.  I am concerned that this is a combination of age-related dysmotility and status post radiation treatment dysphagia progressing to an unmanageable state.  I worry that she would not be able to recover much of her swallowing function at which point she will need to decide if she wants a feeding tube.  We discussed that feeding tubes do not reduce or eliminate the risk of aspiration.  She will remain a high risk for aspiration due to inability to handle secretions, as was evident on exam today.  The family understands this.  We have decided to wait for the CT results, meet again tomorrow to have further discussions on goals.  SUMMARY OF RECOMMENDATIONS   Remain DNR Continue to treat the treatable Meet tomorrow to discuss goals, specifically whether or not a feeding tube is desired  (pending CT results) PMT will continue to support holistically  Symptom Management:  Per primary team Palliative medicine is available to assist with symptom management as needed  Palliative Prophylaxis:  Aspiration, Frequent Pain Assessment, and Oral Care  Additional Recommendations (Limitations, Scope, Preferences): Full Scope Treatment  Psycho-social/Spiritual:  Desire for further Chaplaincy support: yes Additional Recommendations: Caregiving  Support/Resources  Prognosis:  Unable to determine  Discharge Planning:  To Be Determined   Discussed with:  Patient, family, nursing, Dr. Neal/Family Medicine Team    Thank you for allowing [place] to participate in the care of [name] [name] PMT will continue to support holistically.  Time In: 3:00 pm Time Out: 4:45 pm Time Total: 105 min  Greater than 50%  of this time was spent counseling and coordinating care related to the above assessment and plan.  Signed by: Walden Field, NP Palliative Medicine Team  Team Phone # [phone] (Nights/Weekends)  12/17/2020, 4:23 PM

## 2020-12-17 NOTE — Progress Notes (Signed)
  Speech Language Pathology Treatment: Dysphagia  Patient Details Name: [name] [name] MRN: [id number] DOB: [birth year] Today's Date: 12/17/2020 Time: 6503-5465 SLP Time Calculation (min) (ACUTE ONLY): 19 min  Assessment / Plan / Recommendation Clinical Impression  Pt was seen for f/u after MBS on previous date with limited recall of results. SLP provided a review of education for reinforcement. SLP and NT assisted pt with completion of oral care and repositioning, after which she tried a few, single pieces of ice. After the first piece she had immediate expectoration of very thick secretions. She was using the yankauer herself but also used towels as some of the secretions were too thick to be suctioned out. Given that her mouth was just thoroughly cleaned, it is likely that these thick secretions were sitting in her pharynx, perhaps loosened by the melted ice. With additional ice, she had immediate coughing but without as much production. Recommend that she have a few single pieces after oral care to try to add moisture and facilitate hygiene. Pt says that her daughter is present most days by noon, so will also try to f/u at a time when she may be available for education. Continue to recommend palliative care consult to address [place] given severity of dysphagia.    HPI HPI: Pt is an 80 yo female presenting with SOB. Pt found to have severe sepsis from suspected aspiration PNA. She was recently admitted to another hospital 7/2-7/4 for PNA and per family report has had increased phlegm production and decreased PO intake due to N/V since that time. PMH also includes: throat ca s/p XRT and surgery, COPD, exertional dyspnea, HLD, HTN, hypothyroidism, PVD      SLP Plan  Continue with current plan of care       Recommendations  Diet recommendations: NPO (few single pieces of ice after oral care) Medication Administration: Via alternative means                Oral Care Recommendations: Oral care  QID Follow up Recommendations:  (tba) SLP Visit Diagnosis: Dysphagia, pharyngoesophageal phase (R13.14) Plan: Continue with current plan of care       GO                Osie Bond., M.A. [facility] Pager [phone] Office [phone]  12/17/2020, 11:26 AM

## 2020-12-17 NOTE — Progress Notes (Addendum)
Family Medicine Teaching Service Daily Progress Note Intern Pager: [phone]  Patient name: [name] [name] Medical record number: [id number] Date of birth: 02-07-1941 Age: 80 y.o. Gender: female  Primary Care Provider: Maryella Shivers, MD Consultants: None Code Status: DNR, confirmed by patient and daughter  Pt Overview and Major Events to Date:  Admitted on 12/15/20  Assessment and Plan: [name] [name] is an 80 year old female admitted on 8/2 with shortness of breath.  Past medical history significant for COPD (not on oxygen at baseline), hypertension, hyperlipidemia, and peripheral vascular disease.  Severe sepsis Acute hypoxic respiratory failure 2/2 pneumonia, unknown organism, may be more aspiration pneumonitis Obtained procalcitonin  5.97 this morning, so we will not de-escalate antibiotics as this level is high enough to warrant continued treatment. Patient continues to have a sore throat, but no dyspnea at rest, cough.  SLP recommended palliative care because of severe pharyngeal dysphagia on swallow study, so she remains NPO. -Change IV Cefepime to Unasyn, per pharmacy -Blood culture with no growth x2 days -Maintaining oxygen saturations on 2L, supplemental oxygen goal saturations greater than 88% -Monitor vitals closely  Dysphagia/severe malnutrition CT soft tissue neck yesterday did not reveal any remarkable changes from study in 2017. Goals of care conversation this morning with palliative care to discuss feeding options- will coordinate enteral versus parenteral feed with a nutritionist pending decision. Patient and family would like time today to think about options, and palliative will follow up again tonight. Patient tells me that a feeding tube would not be her wish, and we discussed comfort care options regarding feeding. -Continue IVF -Appreciate SLP recommendations -Will follow up with Palliative team regarding patient's wishes on feeding tube vs. Comfort care/hospice  evaluation.  Hypoglycemia Glucose 64 yesterday, changed fluid from NS to D5 1/2NS. Still remains NPO, and we will monitor closely with glucose checks. CBGs 74 --> 86 --> 122 this am - CBG q4h, supplement with D50 if glucose <70  AKI, prerenal, currently CKD 4   urinary retention Monitoring kidney functions closely, currently stable. Cr 1.33 this morning. Voided after having 450 mL on bladder scan overnight. Na 145, changed to IVF from D5 NS to D5 1/2 NS. -Continue maintenance D5 1/2 NS IVF @ 85 ml/hr -Continue bladder scans every 8 hours -Foley placement if retaining urine  Hypertension Overnight, she had elevated blood pressures (160s-180s/60s-70s).  Yesterday the dosage of her IV metoprolol was changed from 2.5 mg to 5 mg every 6 hours.  She received a spot dose of metoprolol 5 mg at 1900 yesterday, with no improvement in blood pressure. We are cautious in aggressively treating high BP due to high risk for recurrent hypotension -Continue IV metoprolol 5 mg every 6 hours  Hyperlipidemia Holding home rosuvastatin daily, n.p.o. Hypothyroidism, levothyroxine 25 mcg daily  FEN/GI: N.p.o. PPx: Heparin 5000 units every 8 H Dispo:Home with home health  pending clinical improvement . Barriers include weakness.   Subjective:  [name] [name] feels okay this morning.  She is not in any distress.  Her only complaint is sore throat..  She denies any abdominal pain, chest pain, vomiting, diarrhea, nausea.  Objective: Temp:  [97.5 F (36.4 C)-98.8 F (37.1 C)] 98.3 F (36.8 C) (08/05 0322) Pulse Rate:  [76-100] 87 (08/05 0400) Resp:  [16-21] 21 (08/05 0400) BP: (148-186)/(57-91) 171/84 (08/05 0400) SpO2:  [91 %-99 %] 93 % (08/05 0400) Weight:  [42.8 kg] 42.8 kg (08/05 0322) Physical Exam: General: Frail-appearing, lying in bed, in no distress, speaks in low volume Cardiovascular: Regular rate rhythm, no  murmurs appreciated Respiratory: No respiratory distress or increased work of breathing.   Good aeration in all lung fields.  Coughs.  No wheezing, crackles, stridor. Abdomen: Soft, nontender, thin, nondistended Extremities: Warm, dry, no visible rashes or lesions.  Well perfused  Laboratory: Recent Labs  Lab 12/15/20 0828 12/16/20 0029 12/18/20 0037  WBC 8.8 6.9 7.9  HGB 9.6* 8.6* 8.7*  HCT 30.9* 26.4* 27.5*  PLT 259 236 281   Recent Labs  Lab 12/16/20 0029 12/17/20 0140 12/18/20 0037  NA 145 145 148*  K 4.4 3.8 3.6  CL 114* 114* 118*  CO2 21* 19* 25  BUN 22 22 17   CREATININE 1.32* 1.47* 1.33*  CALCIUM 9.9 9.9 10.1  PROT 6.3* 5.8* 6.1*  BILITOT 0.7 1.0 0.8  ALKPHOS 42 44 47  ALT 15 15 16   AST 15 15 14*  GLUCOSE 120* 71 124*     Imaging/Diagnostic Tests: CT SOFT TISSUE NECK W CONTRAST  Result Date: 12/17/2020 CLINICAL DATA:  Head/neck cancer, surveillance; history of laryngeal cancer EXAM: CT NECK WITH CONTRAST TECHNIQUE: Multidetector CT imaging of the neck was performed using the standard protocol following the bolus administration of intravenous contrast. CONTRAST:  40mL OMNIPAQUE IOHEXOL 350 MG/ML SOLN COMPARISON:  2017 CT neck FINDINGS: Pharynx and larynx: Nasopharynx and oropharynx are unremarkable. Stable appearance of larynx and hypopharynx. There is stable vocal cord asymmetry. Unchanged sclerosis and irregularity of the thyroid cartilage. Airway is patent. There is some debris within the trachea. Salivary glands: Parotid glands are unremarkable. Atrophic or absent submandibular glands. Thyroid: Small in size. Lymph nodes: No new enlarged or abnormal density nodes. 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 Major neck vessels are patent. There is calcified plaque at the common carotid bifurcations. Limited intracranial: No abnormal enhancement. Visualized orbits: Minimally included portions are unremarkable. Mastoids and visualized paranasal sinuses: No significant opacification. Skeleton: Degenerative changes of the cervical spine,  primarily right-sided facet degeneration. Upper chest: Mild emphysema. Other: None. IMPRESSION: No substantial change since 2017 examination. Post treatment changes without evidence of recurrent disease. Electronically Signed   By: Macy Mis M.D.   On: 12/17/2020 16:07     Orvis Brill, DO 12/18/2020, 7:16 AM PGY-1, [place] Intern pager: [phone], text pages welcome

## 2020-12-17 NOTE — Progress Notes (Signed)
FPTS Brief Progress Note  S:Rounded on patient after being alerted by nursing staff of persistently elevated Bps despite switching to IV metoprolol earlier in the day. Patient denies chest pain, shortness of breath at this time. Recently voided after being found to have 450 mL on bladder scan.  Only complaint at this time is sore throat.    O: BP (!) 185/80 (BP Location: Left Arm)   Pulse 84   Temp 98.8 F (37.1 C) (Oral)   Resp 16   Ht 5\' 4"  (1.626 m)   Wt 42.9 kg   SpO2 98%   BMI 16.23 kg/m   Gen: Frail, elderly female, comfortable in bed Cardio: RRR no murmur Lungs: Normal WOB on 2L [place], lung sounds diminished throughout, no wheezes, rales, rhonchi Extremities: No edema  A/P: - Increased metoprolol to 5mg  IV q6, next dose at 0000.  - Will be cautious in aggressively treating her BP given that she came in hypotensive and would be high risk for recurrent hypotension - Chloraseptic spray for throat pain - Orders reviewed. Labs for AM ordered, which was adjusted as needed.    Eppie Gibson, MD 12/17/2020, 9:43 PM PGY-1, [place] [facility] Night Resident  Please page [phone] with questions.

## 2020-12-17 NOTE — Progress Notes (Addendum)
Family Medicine Teaching Service Daily Progress Note Intern Pager: [phone]  Patient name: [name] [name] Medical record number: [id number] Date of birth: December 02, 1940 Age: 80 y.o. Gender: female  Primary Care Provider: Maryella Shivers, MD Consultants: None Code Status: DNR, confirmed by patient and daughter  Pt Overview and Major Events to Date:  Admitted on 8/2  Assessment and Plan: [name] [name] is an 80 year old female admitted on 8/2 presented with shortness of breath.  Past medical history significant for COPD (not on oxygen at baseline), hypertension, hyperlipidemia, and PVD.  Severe Sepsis Acute hypoxic respiratory failure 2/2 pneumonia, Unknown organism, may be more of aspiration pneumonitis Overnight, had 1 elevated blood pressure reading in the setting of an extended coughing fit.  Patient continues to have a sore throat likely source is lung infection, unknown organism.  MRSA swab negative. Likely aspiration pneumonia given SLP swallow study yesterday revealing severe pharyngeal dysphagia, but CXR shoes left lung base infiltrate which is unlikely location for aspiration pneumonia. SLP recommended palliative care -Continue IV Cefepime, de-escalate -Maintaining sats on 2 LNC, supplemental O2 goal saturations >88% -Follow-up CT neck soft tissue results -Follow-up Legionella urine antigen -Chloraseptic spray for throat -Palliative care consult- to have goals of care conversation and discuss feeding options  Chest Pain Patient complained of chest pain last night, which was new and concerning for ACS given acute onset and association with hypertensive episode.  EKG was obtained- no ischemic changes noted.  She was hypertensive to 203/100, tachycardic to the 110s.  Manual blood pressure rechecked and still hypertensive to 196/90. CXR impression: Left lung base infiltrate as seen on prior CT. Troponins neg x2 (9,9). Chest pain resolved, no chest pain this morning. -Monitor VS q4h  Urinary  retention Patient had not been having urine output, so bladder scan done last night, 600 cc in bladder.  First void 400 cc. Will follow with bladder scans q8h to determine if this is true retention.  -Increased maintenance IV fluids from 75 to 85 mL/IHR -Bladder scan q8h  Hypoglycemia BG 60's this am. Asymptomatic. -Start D5 NS @ 85 ml/hr -CBGs  Hypertension BP stable, one single elevated BP reading overnight likely related to coughing fit. -IV Metoprolol 2.5mg  q6h  AKI, prerenal, currently CKD 4 Cr elevated this am to 1.47 - Increased mIVF from 75 ml/hr to 85 ml/hr - Monitor UOP  Protein calorie malnutrition, severe Severe dysphagia and aspiration after swallow with changes consistent with postradiation dysphagia noted on SLP swallow study. SLP recommended palliative care consult given concern for persistent swalllowing difficulties and severity of dysphagia. - NPO per SLP recommendation - Palliative care consult - Continue home mirtazepine  Hyperlipidemia Holding home rosuvastatin daily,  NPO  Hypothyroidism Levothyroxine 25 mcg daily (decreased from 124mcg)   FEN/GI: NPO PPx: Heparin 5,000 units q8h Dispo:Pending PT recommendations  pending clinical improvement . Barriers include respiratory support, mobility, weakness.   Subjective:  [name] [name] feels okay this morning.  She is not in any distress.  She denies abdominal pain, chest pain vomiting, diarrhea, nausea.  Objective: Temp:  [98.2 F (36.8 C)-98.7 F (37.1 C)] 98.2 F (36.8 C) (08/04 0300) Pulse Rate:  [80-109] 85 (08/04 0300) Resp:  [16-29] 20 (08/04 0300) BP: (129-203)/(57-100) 129/66 (08/04 0300) SpO2:  [92 %-100 %] 98 % (08/04 0300) FiO2 (%):  [28 %] 28 % (08/03 1318) Weight:  [42.9 kg] 42.9 kg (08/04 0500) Physical Exam: General: Frail appearing, laying in bed, in no distress, speaks in low volume Cardiovascular: RRR, no murmurs appreciated Respiratory: No respiratory  distress or increased work of  breathing. Decent aeration throughout lung fields, diminished at left base. No wheezing, crackles, stridor Abdomen: Soft, non-tender, non-distended Extremities: Warm, dry, no visible rashes or lesions. Well perfused  Laboratory: Recent Labs  Lab 12/15/20 0828 12/16/20 0029  WBC 8.8 6.9  HGB 9.6* 8.6*  HCT 30.9* 26.4*  PLT 259 236   Recent Labs  Lab 12/15/20 0828 12/16/20 0029 12/17/20 0140  NA 144 145 145  K 3.3* 4.4 3.8  CL 106 114* 114*  CO2 24 21* 19*  BUN 24* 22 22  CREATININE 1.76* 1.32* 1.47*  CALCIUM 11.3* 9.9 9.9  PROT 7.3 6.3* 5.8*  BILITOT 1.2 0.7 1.0  ALKPHOS 57 42 44  ALT 15 15 15   AST 20 15 15   GLUCOSE 100* 120* 71   Troponins   Imaging/Diagnostic Tests: Pending CT neck soft tissue  Orvis Brill, DO 12/17/2020, 6:58 AM PGY-1, [place] Intern pager: [phone], text pages welcome

## 2020-12-18 ENCOUNTER — Inpatient Hospital Stay (HOSPITAL_COMMUNITY): Payer: Medicare Other

## 2020-12-18 DIAGNOSIS — R058 Other specified cough: Secondary | ICD-10-CM

## 2020-12-18 LAB — COMPREHENSIVE METABOLIC PANEL
ALT: 16 U/L (ref 0–44)
AST: 14 U/L — ABNORMAL LOW (ref 15–41)
Albumin: 2.5 g/dL — ABNORMAL LOW (ref 3.5–5.0)
Alkaline Phosphatase: 47 U/L (ref 38–126)
Anion gap: 5 (ref 5–15)
BUN: 17 mg/dL (ref 8–23)
CO2: 25 mmol/L (ref 22–32)
Calcium: 10.1 mg/dL (ref 8.9–10.3)
Chloride: 118 mmol/L — ABNORMAL HIGH (ref 98–111)
Creatinine, Ser: 1.33 mg/dL — ABNORMAL HIGH (ref 0.44–1.00)
GFR, Estimated: 40 mL/min — ABNORMAL LOW (ref >60)
Glucose, Bld: 124 mg/dL — ABNORMAL HIGH (ref 70–99)
Potassium: 3.6 mmol/L (ref 3.5–5.1)
Sodium: 148 mmol/L — ABNORMAL HIGH (ref 135–145)
Total Bilirubin: 0.8 mg/dL (ref 0.3–1.2)
Total Protein: 6.1 g/dL — ABNORMAL LOW (ref 6.5–8.1)

## 2020-12-18 LAB — CBC
HCT: 27.5 % — ABNORMAL LOW (ref 36.0–46.0)
Hemoglobin: 8.7 g/dL — ABNORMAL LOW (ref 12.0–15.0)
MCH: 27.4 pg (ref 26.0–34.0)
MCHC: 31.6 g/dL (ref 30.0–36.0)
MCV: 86.8 fL (ref 80.0–100.0)
Platelets: 281 10*3/uL (ref 150–400)
RBC: 3.17 MIL/uL — ABNORMAL LOW (ref 3.87–5.11)
RDW: 13.1 % (ref 11.5–15.5)
WBC: 7.9 10*3/uL (ref 4.0–10.5)
nRBC: 0 % (ref 0.0–0.2)

## 2020-12-18 LAB — MAGNESIUM: Magnesium: 1.7 mg/dL (ref 1.7–2.4)

## 2020-12-18 LAB — LEGIONELLA PNEUMOPHILA SEROGP 1 UR AG: L. pneumophila Serogp 1 Ur Ag: NEGATIVE

## 2020-12-18 LAB — PHOSPHORUS: Phosphorus: 3 mg/dL (ref 2.5–4.6)

## 2020-12-18 LAB — PROCALCITONIN: Procalcitonin: 5.97 ng/mL

## 2020-12-18 LAB — GLUCOSE, CAPILLARY: Glucose-Capillary: 128 mg/dL — ABNORMAL HIGH (ref 70–99)

## 2020-12-18 MED ORDER — METOPROLOL TARTRATE 12.5 MG HALF TABLET
12.5000 mg | ORAL_TABLET | Freq: Two times a day (BID) | ORAL | Status: DC
  Administered 2020-12-18 – 2020-12-19 (×2): 12.5 mg via ORAL
  Filled 2020-12-18 (×2): qty 1

## 2020-12-18 MED ORDER — MAGNESIUM SULFATE 2 GM/50ML IV SOLN
2.0000 g | Freq: Once | INTRAVENOUS | Status: AC
  Administered 2020-12-18: 2 g via INTRAVENOUS
  Filled 2020-12-18: qty 50

## 2020-12-18 MED ORDER — AMLODIPINE BESYLATE 5 MG PO TABS
5.0000 mg | ORAL_TABLET | Freq: Every day | ORAL | Status: DC
  Administered 2020-12-18 – 2020-12-21 (×3): 5 mg via ORAL
  Filled 2020-12-18 (×4): qty 1

## 2020-12-18 MED ORDER — SODIUM CHLORIDE 0.9 % IV SOLN
1.5000 g | Freq: Two times a day (BID) | INTRAVENOUS | Status: DC
  Administered 2020-12-18 – 2020-12-22 (×10): 1.5 g via INTRAVENOUS
  Filled 2020-12-18 (×12): qty 4

## 2020-12-18 MED ORDER — OSMOLITE 1.2 CAL PO LIQD
1000.0000 mL | ORAL | Status: DC
  Administered 2020-12-18 – 2020-12-25 (×6): 1000 mL
  Filled 2020-12-18 (×10): qty 1000

## 2020-12-18 MED ORDER — PROSOURCE TF PO LIQD
45.0000 mL | Freq: Every day | ORAL | Status: DC
  Administered 2020-12-18 – 2020-12-25 (×7): 45 mL
  Filled 2020-12-18 (×7): qty 45

## 2020-12-18 MED ORDER — DEXTROSE-NACL 5-0.45 % IV SOLN: INTRAVENOUS | Status: DC

## 2020-12-18 NOTE — Procedures (Signed)
Cortrak  Person Inserting Tube:  Esaw Dace, RD Tube Type:  Cortrak - 43 inches Tube Size:  10 Tube Location:  Right nare Initial Placement:  Stomach Secured by: Bridle Technique Used to Measure Tube Placement:  Marking at nare/corner of mouth Cortrak Secured At:  67 cm Procedure Comments:  Cortrak Tube Team Note:  Consult received to place a Cortrak feeding tube.   X-ray is required, abdominal x-ray has been ordered by the Cortrak team. Please confirm tube placement before using the Cortrak tube.   If the tube becomes dislodged please keep the tube and contact the Cortrak team at [email or web address] ([email or web address]) for replacement.  If after hours and replacement cannot be delayed, place a NG tube and confirm placement with an abdominal x-ray.   Kerman Passey MS, RDN, LDN, CNSC Registered Dietitian III Clinical Nutrition RD Pager and On-Call Pager Number Located in [place]

## 2020-12-18 NOTE — Progress Notes (Signed)
Visited with Patient. Provided prayer, emotional and spiritual support to patient and .  Family at bedside.  Chaplain available as needed.  Jaclynn Major, [place], [facility], Pager [phone]

## 2020-12-18 NOTE — Progress Notes (Addendum)
Daily Progress Note   Patient Name: [name] [name]       Date: 12/18/2020 DOB: [birth year]  Age: 80 y.o. MRN#: [id number] Attending Physician: Dickie La, MD Primary Care Physician: Maryella Shivers, MD Admit Date: 12/15/2020 Length of Stay: 3 days  Reason for Consultation/Follow-up: Establishing goals of care  HPI/Patient Profile:  80 y.o. female  with past medical history of COPD (not on oxygen at baseline), hypertension, hyperlipidemia, PVD, esophageal cancer s/p radiation treatment who was admitted on 12/15/2020 with shortness of breath. She was found to have pneumonia (favored aspiration, possible legionella or unresolved previous pna).  Being treated for acute hypoxic respiratory failure, which she is currently stable on IV antibiotic regimen to cover for aspiration pneumonia.  Blood culture with no growth for 2 days.  Met sepsis criteria initially.  During her hospitalization it was noted that she has dysphagia and severe malnutrition.  CT of the soft tissues of the neck was ordered to rule out other causes.     Speech-language pathology was consulted and found severe pharyngeal dysphagia with minimal entrance in the cervical esophagus, impaired mobility throughout the pharynx and larynx with changes that could be consistent with postradiation dysphagia.  Noted little to no movement despite cues and noted aspiration during swallow.  Based on these findings, recommended n.p.o. and palliative care consult.  Subjective:   Subjective: Chart Reviewed. Updates received. Patient Assessed. Created space and opportunity for patient  and family to explore thoughts and feelings regarding current medical situation.  Today's Discussion: When I entered the room the patient was in the bed and her daughter was at the bedside. The patient was struggling with coughing and trying to expectorate/suction secretions. Noted very weak cough, weak voice. It took about 5 minutes to cough up her secretions and  wipe/suction them away.  We reviewed her CT results which show no change. We celebrated the good news that her cancer isn't back, but the bad news that her dysphagia is not from an obvious, treatable cause. We discussed that her swallowing difficulties are not likely to be reversible and that whicle a feeding tube provides nutrition and hydration, it DOES NOT: reduce aspiration risk (especially from secretions) or improve her baseline chronic conditions.  We discussed certain decision around feeding tube, aggressive care, comfort care and what each of these would look like. We discussed best/worst/most likely case scenarios. I shared that I feel even with feeding tube she would require prolonged rehab for strengthening and would continue to struggle with aspiration pneumonia, repeated hospitalization, likely inability to be independent again/possible need for LTC SNF eventually, would continue to struggle with secretions and coughing.  All of this is IF the patient is a candidate for feeding tube placement (may be too weak/frail/ill to tolerate). The alternative would be shift to comfort care, focus on comfort, dignity, and happiness. In this situation we could liberalize visitation, allow comfort feeds (with accepted risk of aspiration/pneumonia). In this situation she could be evaluated for hospice services.  The patient and her daughter asked for time today to discuss their options. I expressed the need to make a decision in the next day or so due to the prolonged NPO status to allow eval for candidacy of TF placement vs. TPN. We agree to follow-up tomorrow for more discussion. "Difficult Choices for Loving People" booklet provided to both the patient and her daughter.  Review of Systems  Constitutional:  Positive for appetite change, fatigue and unexpected weight change.  HENT:  Positive for  drooling, sore throat, trouble swallowing and voice change.   Respiratory:  Positive for cough and choking.  Negative for shortness of breath.   Cardiovascular:  Negative for leg swelling.  Gastrointestinal:  Negative for abdominal pain, nausea and vomiting.   Objective:   Vital Signs: BP (!) 175/85 (BP Location: Left Arm)   Pulse 83   Temp 97.9 F (36.6 C) (Oral)   Resp 18   Ht _0  (1.626 m)   Wt 42.8 kg   SpO2 97%   BMI 16.20 kg/m  SpO2: SpO2: 97 % O2 Device: O2 Device: Room Air O2 Flow Rate: O2 Flow Rate (L/min): 2 L/min  Physical Exam: Physical Exam Vitals and nursing note reviewed.  Constitutional:      General: She is not in acute distress.    Appearance: She is cachectic. She is ill-appearing.     Comments: Frail  HENT:     Head: Normocephalic and atraumatic.  Cardiovascular:     Rate and Rhythm: Normal rate and regular rhythm.     Heart sounds: Normal heart sounds.  Pulmonary:     Effort: Pulmonary effort is normal. No respiratory distress.     Breath sounds: Normal breath sounds. No wheezing, rhonchi or rales.     Comments: Ongoing cough with secretions Abdominal:     General: Abdomen is flat. Bowel sounds are normal.     Palpations: Abdomen is soft.     Tenderness: There is no abdominal tenderness.  Skin:    General: Skin is warm and dry.  Neurological:     Mental Status: She is alert and oriented to person, place, and time.  Psychiatric:        Mood and Affect: Mood normal.        Behavior: Behavior normal.    SpO2: SpO2: 97 % O2 Device:SpO2: 97 % O2 Flow Rate: .O2 Flow Rate (L/min): 2 L/min  LBM: Last BM Date:  (unknown) Baseline Weight: Weight: 54.9 kg Most recent weight: Weight: 42.8 kg   Palliative Assessment/Data: 10%   Assessment & Plan:   Impression:  Very frail, cachectic appearing 80 year old female with a history of esophageal cancer status postradiation therapy who was admitted for her second pneumonia admission and is many months.  Overall most likely favors aspiration pneumonia.  Found to have severe dysphagia and severe malnutrition  with decreased oral intake over the past several weeks.  Her albumin is quite low.  She does appear quite thin and cachectic supporting her malnutrition diagnosis.    Based on CT findings this is likely  a combination of age-related dysmotility and status post radiation treatment dysphagia progressing to an unmanageable state.  I worry that she would not be able to recover much of her swallowing function at which point she will need to decide if she wants a feeding tube.  We discussed that feeding tubes do not reduce or eliminate the risk of aspiration.  She will remain a high risk for aspiration due to inability to handle secretions, as was evident on exam today.  The family understands this.  We had a good discussion on options today (see above) and will allow today to discuss with family and make decisions. Planned follow-up tomorrow.  SUMMARY OF RECOMMENDATIONS   Continue to treat the treatable [place] DNR Allow today for thought and discussion with family Follow-up tomorrow for more discussions/decisions PMT will continue to follow/support holistically  Code Status: DNR  Symptom Management: Per primary team Palliative available to assist as needed  Prognosis: < 6 months  Discharge Planning: To Be Determined  Discussed with: Patient, daughter, nursing staff, Dr. Owens Shark (Family medicine resident)  Thank you for allowing [place] to participate in the care of Hetal Proano PMT will continue to support holistically.  Time Total: 70 mins  Visit consisted of counseling and education dealing with the complex and emotionally intense issues of symptom management and palliative care in the setting of serious and potentially life-threatening illness. Greater than 50%  of this time was spent counseling and coordinating care related to the above assessment and plan.  Walden Field, NP Palliative Medicine Team  Team Phone # [phone] (Nights/Weekends)  12/18/2020, 10:45 AM

## 2020-12-18 NOTE — Progress Notes (Signed)
  Speech Language Pathology Treatment: Dysphagia  Patient Details Name: [name] [name] MRN: [id number] DOB: 1940/11/01 Today's Date: 12/18/2020 Time: 1040-1110 SLP Time Calculation (min) (ACUTE ONLY): 30 min  Assessment / Plan / Recommendation Clinical Impression  [name] [name], [name] [name]'s daughter, was at bedside, so we had an opportunity to review yesterday's MBS.  They have been in discussion with Palliative Care about options.  I retrieved the video of MBS so that they could visualize the immobility of the swallowing structures and see the aspiration of the barium. [name] [name] had a good understanding of the images and reiterated to her mother the basics of the video.  We discussed that the benefits of radiation treatment unfortunately can have a long-term cost, which is often the sacrificing of the swallow/normal eating, and that this may take years to have an impact. [name] [name] in particular voiced understanding.    [name] [name] was provided with ice chips - she presented with some mild coughing but no real phlegm production.  She was encouraged to have occasional ice after oral care to facilitate health of oral mucosa.  They are considering their options at this time with the help of Palliative Care.  Offered support and encouragement. SLP will follow along.    HPI HPI: Pt is an 80 yo female presenting with SOB. Pt found to have severe sepsis from suspected aspiration PNA. She was recently admitted to another hospital 7/2-7/4 for PNA and per family report has had increased phlegm production and decreased PO intake due to N/V since that time. PMH also includes: throat ca s/p XRT and surgery, COPD, exertional dyspnea, HLD, HTN, hypothyroidism, PVD      SLP Plan  Continue with current plan of care       Recommendations  Diet recommendations: NPO (may have some ice after oral care)                Oral Care Recommendations: Oral care QID Follow up Recommendations: None SLP Visit Diagnosis:  Dysphagia, pharyngoesophageal phase (R13.14) Plan: Continue with current plan of care       GO               [name] [name] L. Tivis Ringer, [place] CCC/SLP Acute Rehabilitation Services Office number [phone] Pager [phone]  [name] [name] 12/18/2020, 12:18 PM

## 2020-12-18 NOTE — Progress Notes (Addendum)
Nutrition Follow Up  DOCUMENTATION CODES:   Severe malnutrition in context of chronic illness, Underweight  INTERVENTION:   Once Cortrak confirmed tube tip in stomach via xray start tube feeding of:  -Osmolite 1.2 @ 20 ml/hr -Increase by 10 ml Q12 hours to goal rate of 55 ml/hr (1320 ml) -ProSource TF 45 ml once daily   At goal rate TF provides: 1624 kcals, 84 grams protein, 1082 ml free water.   Monitor magnesium, potassium, and phosphorus daily for at least 3 days, MD to replete as needed, as pt is at risk for refeeding syndrome.   No BM documented since admit, recommend addition of scheduled regimen   NUTRITION DIAGNOSIS:   Severe Malnutrition related to chronic illness (COPD) as evidenced by percent weight loss, severe fat depletion, severe muscle depletion.  Ongoing  GOAL:   Patient will meet greater than or equal to 90% of their needs  Cortrak being place, TF to start   MONITOR:   Diet advancement, PO intake, Supplement acceptance, Labs, Weight trends, I & O's  REASON FOR ASSESSMENT:   Malnutrition Screening Tool    ASSESSMENT:  80 yo female with a PMH of COPD (not on O2 at baseline), HTN, HLD, and PVD who presents with aspiration PNA. Daughter at bedside and reports that patient was admitted July 2-4th for PNA. She states that the patient has had decreased PO intake since then and has progressively become weaker over the last few weeks.  Patient having issues with coughing and secretions.   Had MBS yesterday which revealed severe pharyngeal dysphagia with minimal entrance in the cervical esophagus likely a result of radiation treatments. It was recommended to make patient NPO and have goals of care discussion with palliative.   Family and patient have opted for Cortrak placement with eventual PEG placement for nutrition as dysphagia is likely irreversible. Of note, patient remains high risk for aspiration given copious secretions.   Given prolonged poor  intake, anticipate patient will likely refeed. Titrate tube feeding slowly and replete electrolytes as needed.   UOP: 1840 ml x 24 hrs   Drips: D5 in 1/2 NS @ 85 ml/hr  Medications: remeron Labs: Na 148 (H) CBG 67-122  Diet Order:   Diet Order             Diet NPO time specified Except for: Sips with Meds  Diet effective now                  EDUCATION NEEDS:  Education needs have been addressed  Skin:  Skin Assessment: Reviewed RN Assessment  Last BM:  PTA  Height:  Ht Readings from Last 1 Encounters:  12/15/20 5\' 4"  (1.626 m)   Weight:  Wt Readings from Last 1 Encounters:  12/18/20 42.8 kg   BMI:  Body mass index is 16.2 kg/m.  Estimated Nutritional Needs:   Kcal:  1500-1700 kcal  Protein:  75-90 grams  Fluid:  >/= 1.5 L/day  Mariana Single MS, RD, LDN, CNSC Clinical Nutrition Pager listed in [place]

## 2020-12-18 NOTE — Care Management Important Message (Signed)
Important Message  Patient Details  Name: [name] [name] MRN: [id number] Date of Birth: Jul 06, 1940   Medicare Important Message Given:  Yes     Zenon Mayo, RN 12/18/2020, 1:45 PM

## 2020-12-18 NOTE — Progress Notes (Signed)
PT Cancellation Note  Patient Details Name: Mikka Kissner MRN: [id number] DOB: 1940/10/10   Cancelled Treatment:    Reason Eval/Treat Not Completed: Patient at procedure or test/unavailable (Pt getting cortrak placed will need chest xray for placement, nursing deferred at this time. Will f/u per POC.)   Ed Rayson Eli Hose 12/18/2020, 4:48 PM  Erasmo Leventhal , PTA Acute Rehabilitation Services Pager [phone] Office [phone]

## 2020-12-19 LAB — CBC
HCT: 31.5 % — ABNORMAL LOW (ref 36.0–46.0)
Hemoglobin: 9.8 g/dL — ABNORMAL LOW (ref 12.0–15.0)
MCH: 27.1 pg (ref 26.0–34.0)
MCHC: 31.1 g/dL (ref 30.0–36.0)
MCV: 87 fL (ref 80.0–100.0)
Platelets: 285 10*3/uL (ref 150–400)
RBC: 3.62 MIL/uL — ABNORMAL LOW (ref 3.87–5.11)
RDW: 12.9 % (ref 11.5–15.5)
WBC: 7.2 10*3/uL (ref 4.0–10.5)
nRBC: 0 % (ref 0.0–0.2)

## 2020-12-19 LAB — COMPREHENSIVE METABOLIC PANEL
ALT: 13 U/L (ref 0–44)
AST: 17 U/L (ref 15–41)
Albumin: 2.5 g/dL — ABNORMAL LOW (ref 3.5–5.0)
Alkaline Phosphatase: 50 U/L (ref 38–126)
Anion gap: 9 (ref 5–15)
BUN: 17 mg/dL (ref 8–23)
CO2: 23 mmol/L (ref 22–32)
Calcium: 9.7 mg/dL (ref 8.9–10.3)
Chloride: 114 mmol/L — ABNORMAL HIGH (ref 98–111)
Creatinine, Ser: 1.39 mg/dL — ABNORMAL HIGH (ref 0.44–1.00)
GFR, Estimated: 38 mL/min — ABNORMAL LOW (ref >60)
Glucose, Bld: 117 mg/dL — ABNORMAL HIGH (ref 70–99)
Potassium: 3.5 mmol/L (ref 3.5–5.1)
Sodium: 146 mmol/L — ABNORMAL HIGH (ref 135–145)
Total Bilirubin: 0.3 mg/dL (ref 0.3–1.2)
Total Protein: 6 g/dL — ABNORMAL LOW (ref 6.5–8.1)

## 2020-12-19 LAB — GLUCOSE, CAPILLARY
Glucose-Capillary: 112 mg/dL — ABNORMAL HIGH (ref 70–99)
Glucose-Capillary: 115 mg/dL — ABNORMAL HIGH (ref 70–99)
Glucose-Capillary: 118 mg/dL — ABNORMAL HIGH (ref 70–99)
Glucose-Capillary: 118 mg/dL — ABNORMAL HIGH (ref 70–99)
Glucose-Capillary: 122 mg/dL — ABNORMAL HIGH (ref 70–99)
Glucose-Capillary: 125 mg/dL — ABNORMAL HIGH (ref 70–99)

## 2020-12-19 LAB — PHOSPHORUS
Phosphorus: 3.8 mg/dL (ref 2.5–4.6)
Phosphorus: 3.7 mg/dL (ref 2.5–4.6)

## 2020-12-19 LAB — MAGNESIUM
Magnesium: 2.3 mg/dL (ref 1.7–2.4)
Magnesium: 2.1 mg/dL (ref 1.7–2.4)

## 2020-12-19 MED ORDER — LEVOTHYROXINE SODIUM 25 MCG PO TABS
25.0000 ug | ORAL_TABLET | Freq: Every day | ORAL | Status: DC
  Administered 2020-12-20 – 2020-12-25 (×6): 25 ug
  Filled 2020-12-19 (×6): qty 1

## 2020-12-19 MED ORDER — MIRTAZAPINE 7.5 MG PO TABS
15.0000 mg | ORAL_TABLET | Freq: Every day | ORAL | Status: DC
  Administered 2020-12-19 – 2020-12-21 (×2): 15 mg
  Filled 2020-12-19 (×2): qty 2

## 2020-12-19 MED ORDER — SODIUM CHLORIDE 0.9 % IV SOLN: INTRAVENOUS | Status: DC

## 2020-12-19 MED ORDER — METOPROLOL TARTRATE 12.5 MG HALF TABLET
12.5000 mg | ORAL_TABLET | Freq: Two times a day (BID) | ORAL | Status: DC
  Administered 2020-12-19 – 2020-12-23 (×5): 12.5 mg
  Filled 2020-12-19 (×5): qty 1

## 2020-12-19 NOTE — Progress Notes (Signed)
Physical Therapy Treatment Patient Details Name: [name] [name] MRN: [id number] DOB: November 11, 1940 Today's Date: 12/19/2020    History of Present Illness Pt is an 80 y.o. female presenting to ED 8/2 with shortness of breath. Admitted for acute hypoxic respiratory failure likely 2/2 suspected aspiration pneumonia and severe sepsis. Past medical history significant for COPD (not on oxygen at baseline), hypertension, hyperlipidemia & PVD.    PT Comments    The pt was received in bed, eager to mobilize with PT this afternoon. The pt was able to complete bed mobility with no physical assist, but does require increased time and assist for line management to complete transition. The pt was then able to complete LE exercises while sitting EOB and hallway ambulation with single UE support and SpO2 >90% on 1L O2. The pt will continue to benefit from skilled PT to progress OOB mobility and endurance.     Follow Up Recommendations  Home health PT;Supervision/Assistance - 24 hour     Equipment Recommendations  3in1 (PT)    Recommendations for Other Services       Precautions / Restrictions Precautions Precautions: Fall Precaution Comments: monitor O2, Cortrak placed Restrictions Weight Bearing Restrictions: No    Mobility  Bed Mobility Overal bed mobility: Needs Assistance Bed Mobility: Supine to Sit     Supine to sit: Min guard;HOB elevated     General bed mobility comments: HOB elevated and increased time    Transfers Overall transfer level: Needs assistance Equipment used: 1 person hand held assist Transfers: Sit to/from [facility] Sit to Stand: Min guard Stand pivot transfers: Min guard       General transfer comment: minG with increased time to power up and steady. slight posterior LOB with initial stand  Ambulation/Gait Ambulation/Gait assistance: Min guard Gait Distance (Feet): 75 Feet Assistive device: IV Pole;1 person hand held assist Gait  Pattern/deviations: Step-through pattern;Decreased stride length Gait velocity: decreased Gait velocity interpretation: <1.31 ft/sec, indicative of household ambulator General Gait Details: pt initially with HHA and IV pole, able to progress to SUE support on IV pole only. minG to light minA to steady at times.   Stairs             Wheelchair Mobility    Modified Rankin (Stroke Patients Only)       Balance Overall balance assessment: Needs assistance Sitting-balance support: No upper extremity supported;Feet supported Sitting balance-Leahy Scale: Good     Standing balance support: Bilateral upper extremity supported Standing balance-Leahy Scale: Poor Standing balance comment: reliant on at least one UE support                            Cognition Arousal/Alertness: Awake/alert Behavior During Therapy: WFL for tasks assessed/performed Overall Cognitive Status: Within Functional Limits for tasks assessed                                 General Comments: pt following all commands briskly, minimal verbalizations but able to express needs and desires      Exercises General Exercises - Lower Extremity Long Arc Quad: AROM;Both;10 reps;Seated (against min resistance) Heel Slides: AROM;Both;10 reps;Seated (against min resistance) Toe Raises: AROM;Both;10 reps;Seated Heel Raises: AROM;Both;10 reps;Seated    General Comments General comments (skin integrity, edema, etc.): SpO2 maintained in 90s with 1L O2      Pertinent Vitals/Pain Pain Assessment: No/denies pain Faces Pain Scale: No  hurt     PT Goals (current goals can now be found in the care plan section) Acute Rehab PT Goals Patient Stated Goal: keep moving PT Goal Formulation: With patient Time For Goal Achievement: 12/30/20 Potential to Achieve Goals: Good Progress towards PT goals: Progressing toward goals    Frequency    Min 3X/week      PT Plan Current plan remains  appropriate       AM-PAC PT "6 Clicks" Mobility   Outcome Measure  Help needed turning from your back to your side while in a flat bed without using bedrails?: A Little Help needed moving from lying on your back to sitting on the side of a flat bed without using bedrails?: A Little Help needed moving to and from a bed to a chair (including a wheelchair)?: A Little Help needed standing up from a chair using your arms (e.g., wheelchair or bedside chair)?: A Little Help needed to walk in hospital room?: A Little Help needed climbing 3-5 steps with a railing? : A Little 6 Click Score: 18    End of Session Equipment Utilized During Treatment: Gait belt;Oxygen Activity Tolerance: Patient tolerated treatment well Patient left: in chair;with chair alarm set;with call bell/phone within reach Nurse Communication: Mobility status PT Visit Diagnosis: Muscle weakness (generalized) (M62.81);Difficulty in walking, not elsewhere classified (R26.2)     Time: 1610-9604 PT Time Calculation (min) (ACUTE ONLY): 25 min  Charges:  $Gait Training: 23-37 mins                     Inocencio Homes, PT, DPT   Acute Rehabilitation Department Pager #: [phone]   Otho Bellows 12/19/2020, 5:48 PM

## 2020-12-19 NOTE — Progress Notes (Signed)
FPTS Interim Night Progress Note  S:Patient sleeping comfortably.  Rounded with primary night RN.  No concerns voiced.  No orders required.    O: Today's Vitals   12/18/20 2300 12/18/20 2344 12/19/20 0300 12/19/20 0339  BP:  (!) 147/64  (!) 166/70  Pulse: 72 73 77 80  Resp: 17 15 17 20   Temp:  97.7 F (36.5 C)  97.7 F (36.5 C)  TempSrc:  Oral  Oral  SpO2: 100% 99% 98% 98%  Weight:    44.1 kg  Height:      PainSc: 0-No pain  0-No pain       A/P: Core track in place and tolerating tube feeding Tolerating Osmolite at 20/h, increasing 10cc q12h to goal of 55/hr Monitor Electrolytes for refeeding syndrome Switch IVF to NS 50/hr Restarted home Amlodipine 5 mg daily, consider increasing 10 mg Started Metoprolol Tartrate 25 mg BID   Carollee Leitz MD PGY-3, [place] Medicine Service pager [phone]

## 2020-12-19 NOTE — Progress Notes (Addendum)
Family Medicine Teaching Service Daily Progress Note Intern Pager: [phone]  Patient name: [name] [name] Medical record number: [id number] Date of birth: January 02, 1941 Age: 80 y.o. Gender: female  Primary Care Provider: Maryella Shivers, MD Consultants: Palliative Medicine,  Code Status: DNR, confirmed by patient and daughter  Pt Overview and Major Events to Date:  [name] [name] is an 80 year old female admitted on 8/2 with shortness of breath. PMH  significant for COPD (not on oxygen at baseline), HTN, HLD, and peripheral vascular disease.  Assessment and Plan: Severe sepsis Acute hypoxic respiratory failure 2/2 pneumonia, unknown organism, may be more aspiration pneumonitis She continues to have a sore throat, but no dyspnea at rest.  Remains afebrile and hemodynamically stable.  She continues to be very weak, but afebrile and hemodynamically stable. WBC 7.2 this morning. Will repeat procalcitonin tomorrow morning to guide antibiotic de-escalation. -Continue IV Unasyn, (day #2) per pharmacy -Blood cultures no growth -Maintaining oxygen saturations on 2 L, supplemental oxygen goals greater than 88%  Dysphagia/severe malnutrition Palliative medicine met with the family yesterday, and the decision was made to go forward with a feeding tube and eventual PEG placement.  Core track feeding tube was placed yesterday, and placement confirmed with an abdominal x-ray.  She has been tolerating the feeds well without vomiting.  We will need to monitor very closely for refeeding syndrome by checking electrolytes daily (monitoring for hypophosphatemia, hypokalemia, hypomagnasemia, edema, cardiac complications, respiratory status, myalgias) given prolonged poor intake.  She was repleted with IV magnesium 2 g overnight.   Magnesium  2.3, Phosphorous 3.8 this morning. -Tolerating Osmolite at 30 cc/hr, increased to 40cc this morning. Will continue increasing 10 cc q12 hours to reach goal of 55/hr -Monitor  magnesium, phosphorus, sodium, potassium for refeeding syndrome -Continue IV fluid NS at 40 mL/hr -Continue mirtazapine tablet 15 mg -Monitor vital signs closely -Nutrition following, appreciate recommendations  Hypertension Blood pressures remained elevated overnight (140s-170s/60s-70s). Improved this morning with readings 130s-140s/60s-70s. Metoprolol was changed from IV to oral and will be crushed and administered via core track feeding tube.  Amlodipine 5 mg restarted.  If her pressures remain elevated or she becomes asymptomatic, we will consider increasing dosage of amlodipine.  We will continue to monitor blood pressures closely.  She does not have any chest pain, headache, shortness of breath. -Continue metoprolol 12.49m twice daily -Continue amlodipine 5 mg  Hypoglycemia CBG glucose checks ranging from 100-120s. IV fluid changed from D5 NS to NS @ 40 ml/hr. -CBG once daily for next 2 days  AKI, prerenal, currently CKD 4  Monitoring kidney functions closely, currently stable. Cr 1.39 today, 1.33 yesterday. Voided overnight with assistance. UOP 600 ml. -Continue maintenance IVF NS @ 414mhr, titrate with osmolite feeds  Hyperlipidemia Continue home rosuvastatin daily  Hypothyroidism Continue home levothyroxine 25 mcg daily.  FEN/GI: Osmolite tube feeding PPx: Heparin 5000 units every 8 hours Dispo:Home with home health  pending clinical improvement . Barriers include weakness.   Subjective:  [name] no complaints this morning other than a sore throat and secretions she has to suction. She is tolerating the feeding tube well. She was laying in bed comfortably watching TV and she was pleasant and conversational. Her daughter [name] Bandyill be coming to the hospital later today. No chest pain, abdominal pain, vomiting.   Objective: Temp:  [97.7 F (36.5 C)] 97.7 F (36.5 C) (08/06 0339) Pulse Rate:  [72-81] 80 (08/06 0339) Resp:  [15-20] 20 (08/06 0339) BP:  (139-185)/(64-81) 166/70 (08/06 0339) SpO2:  [92 %-100 %]  98 % (08/06 0339) Weight:  [44.1 kg] 44.1 kg (08/06 0339) Physical Exam: General: Frail-appearing, laying in bed in no distress, speaks in low volume. Feeding tube in place in right nare.  HEENT: MMM, suctioning clear secretions out of mouth Cardiovascular: RRR Respiratory: No respiratory distress, or increased work of breathing Abdomen: Thin, soft, nondistended Extremities: Warm, dry, well-perfused. No rashes or lesions. Cachectic.  Laboratory: Recent Labs  Lab 12/16/20 0029 12/18/20 0037 12/19/20 0508  WBC 6.9 7.9 7.2  HGB 8.6* 8.7* 9.8*  HCT 26.4* 27.5* 31.5*  PLT 236 281 285   Recent Labs  Lab 12/17/20 0140 12/18/20 0037 12/19/20 0508  NA 145 148* 146*  K 3.8 3.6 3.5  CL 114* 118* 114*  CO2 19* 25 23  BUN 22 17 17   CREATININE 1.47* 1.33* 1.39*  CALCIUM 9.9 10.1 9.7  PROT 5.8* 6.1* 6.0*  BILITOT 1.0 0.8 0.3  ALKPHOS 44 47 50  ALT 15 16 13   AST 15 14* 17  GLUCOSE 71 124* 117*    Imaging/Diagnostic Tests: DG Abd Portable 1V  Result Date: 12/18/2020 CLINICAL DATA:  NG tube placement EXAM: PORTABLE ABDOMEN - 1 VIEW COMPARISON:  Chest radiograph, 12/16/20. FINDINGS: Enteric feeding tube with tip projecting to the right of midline, likely at the duodenal bulb. The imaged bowel is not obstructed. Left basilar infiltrate, best appreciated on comparison chest radiograph. Aortic vascular calcifications. No acute osseous abnormality. IMPRESSION: Transpyloric enteric feeding tube. Electronically Signed   By: Michaelle Birks MD   On: 12/18/2020 16:44     [name] Brill, DO 12/19/2020, 7:36 AM PGY-1, [place] Intern pager: [phone], text pages welcome

## 2020-12-19 NOTE — Progress Notes (Signed)
Daily Progress Note   Patient Name: [name] [name]       Date: 12/19/2020 DOB: [birth year]  Age: 80 y.o. MRN#: [id number] Attending Physician: Dickie La, MD Primary Care Physician: Maryella Shivers, MD Admit Date: 12/15/2020 Length of Stay: 4 days  Reason for Consultation/Follow-up: Establishing goals of care  HPI/Patient Profile:  80 y.o. female  with past medical history of COPD (not on oxygen at baseline), hypertension, hyperlipidemia, PVD, esophageal cancer s/p radiation treatment who was admitted on 12/15/2020 with shortness of breath. She was found to have pneumonia (favored aspiration, possible legionella or unresolved previous pna).  Being treated for acute hypoxic respiratory failure, which she is currently stable on IV antibiotic regimen to cover for aspiration pneumonia.  Blood culture with no growth for 2 days.  Met sepsis criteria initially.  During her hospitalization it was noted that she has dysphagia and severe malnutrition.  CT of the soft tissues of the neck was ordered to rule out other causes.     Speech-language pathology was consulted and found severe pharyngeal dysphagia with minimal entrance in the cervical esophagus, impaired mobility throughout the pharynx and larynx with changes that could be consistent with postradiation dysphagia.  Noted little to no movement despite cues and noted aspiration during swallow.  Based on these findings, recommended n.p.o. and palliative care consult.  Subjective:   Subjective: Chart Reviewed. Updates received. Patient Assessed. Created space and opportunity for patient  and family to explore thoughts and feelings regarding current medical situation.  Today's Discussion: When I entered the room the patient is OOB to the bedside chair. CorTrak is in place. She appears/sounds stronger today. States she's having a good day, feels stronger today. Denies any pain, N/V. Breathing doing well, no dyspnea. Asked to confirm that they decided to  proceed with PEG tube placement and she states her daughter talked to the nurse yesterday about it. However, she did confirm she is agreeable. No other complaints today, feels like she's tolerating TF.  Review of Systems  Constitutional:  Positive for appetite change (since baseline).  Respiratory:  Positive for cough. Negative for shortness of breath.   Cardiovascular:  Negative for chest pain and leg swelling.  Gastrointestinal:  Negative for abdominal pain, nausea and vomiting.   Objective:   Vital Signs: BP (!) 138/58   Pulse 75   Temp (!) 97.5 F (36.4 C) (Axillary)   Resp 18   Ht 5' 4"  (1.626 m)   Wt 44.1 kg   SpO2 100%   BMI 16.69 kg/m  SpO2: SpO2: 100 % O2 Device: O2 Device: Nasal Cannula O2 Flow Rate: O2 Flow Rate (L/min): 1 L/min  Physical Exam: Physical Exam Vitals and nursing note reviewed.  Constitutional:      General: She is not in acute distress.    Appearance: She is cachectic. She is ill-appearing. She is not toxic-appearing.  Cardiovascular:     Rate and Rhythm: Normal rate and regular rhythm.     Heart sounds: No murmur heard. Pulmonary:     Effort: Pulmonary effort is normal. No respiratory distress.     Breath sounds: Rhonchi (improved) present. No wheezing.  Abdominal:     General: Abdomen is flat.     Palpations: Abdomen is soft.     Tenderness: There is no abdominal tenderness.  Skin:    General: Skin is warm and dry.  Neurological:     Mental Status: She is alert.     Motor: Weakness present.    SpO2:  SpO2: 100 % O2 Device:SpO2: 100 % O2 Flow Rate: .O2 Flow Rate (L/min): 1 L/min  LBM: Last BM Date:  (unknown) Baseline Weight: Weight: 54.9 kg Most recent weight: Weight: 44.1 kg   Palliative Assessment/Data: 50%   Assessment & Plan:   Impression:  Very frail, cachectic appearing 80 year old female with a history of esophageal cancer status postradiation therapy who was admitted for her second pneumonia admission and is many months.   Overall most likely favors aspiration pneumonia.  Found to have severe dysphagia and severe malnutrition with decreased oral intake over the past several weeks.  Her albumin is quite low.  She does appear quite thin and cachectic supporting her malnutrition diagnosis.     Based on CT findings this is likely  a combination of age-related dysmotility and status post radiation treatment dysphagia progressing to an unmanageable state.  I worry that she would not be able to recover much of her swallowing function at which point she will need to decide if she wants a feeding tube.  We discussed that feeding tubes do not reduce or eliminate the risk of aspiration.  She will remain a high risk for aspiration due to inability to handle secretions, as was evident on exam today.  The family understands this.  We had a good discussion on options today (see above) and the family decided (after thought, prayer, and discussion) to proceed with a CorTrak as bridge to PEG evaluation/PEG placement. CorTrak placed yesterday. Note from IR indicates planning for CT w/o (anatomical mapping) and PEG tube placement when patient/family makes decision  SUMMARY OF RECOMMENDATIONS   Continue to treat the treatable Remins DNR Patient/family has elected CorTrak/PEG placement Proceed with PEG per patient/family wishes PMT will continue to follow/support holistically  Code Status: DNR  Symptom Management: Per primary team Palliative available to assist as needed  Prognosis: < 6 months  Discharge Planning: To Be Determined  Discussed with: Patient, daughter, nursing staff; paged IR (awaiting response)  Thank you for allowing [place] to participate in the care of Ainsley Deakins PMT will continue to support holistically.  Time Total: 35 min  Visit consisted of counseling and education dealing with the complex and emotionally intense issues of symptom management and palliative care in the setting of serious and potentially  life-threatening illness. Greater than 50%  of this time was spent counseling and coordinating care related to the above assessment and plan.  Walden Field, NP Palliative Medicine Team  Team Phone # [phone] (Nights/Weekends)  12/19/2020, 8:31 AM

## 2020-12-19 NOTE — Progress Notes (Addendum)
IR was requested for G tube placement.   CT abd w/o needed for formal anatomy eval per Dr.Suttle.  Note from palliative care yesterday states that family will decide today if they would like to proceed with G tube placement.   Will order CT abd w/o if family wishes to proceed.  Please call IR for questions and concerns.    Armando Gang Shamar Engelmann PA-C 12/19/2020 10:54 AM

## 2020-12-19 NOTE — Progress Notes (Signed)
Occupational Therapy Treatment Patient Details Name: [name] [name] MRN: [id number] DOB: 30-Jun-1940 Today's Date: 12/19/2020    History of present illness Pt is an 80 y.o. female presenting to ED 8/2 with shortness of breath. Admitted for acute hypoxic respiratory failure likely 2/2 suspected aspiration pneumonia and severe sepsis. Past medical history significant for COPD (not on oxygen at baseline), hypertension, hyperlipidemia & PVD.   OT comments  Patient with continued participation and fair progress toward patient focused OT goals.  Patient needing increased time and up to [facility] for bed mobility and steps to recliner.  O2 sats on RA during transfer remained above 95% with no SOB noted.  Continue OT in the acute setting to continue to progress ADL status for eventual return home with 24 hour assist as needed and [place] OT.     Follow Up Recommendations  Home health OT;Supervision - Intermittent    Equipment Recommendations  Tub/shower seat    Recommendations for Other Services      Precautions / Restrictions Precautions Precautions: Fall Precaution Comments: monitor O2, Cortrak placed Restrictions Weight Bearing Restrictions: No       Mobility Bed Mobility Overal bed mobility: Needs Assistance Bed Mobility: Supine to Sit     Supine to sit: Min guard;HOB elevated     General bed mobility comments: HOB elevated and increased time Patient Response: Cooperative  Transfers Overall transfer level: Needs assistance   Transfers: Sit to/from [employer] Transfers Sit to Stand: Min guard Stand pivot transfers: Min guard            Balance Overall balance assessment: Needs assistance Sitting-balance support: No upper extremity supported;Feet supported Sitting balance-Leahy Scale: Good     Standing balance support: Bilateral upper extremity supported Standing balance-Leahy Scale: Poor Standing balance comment: reliant on at least one UE support                            ADL either performed or assessed with clinical judgement   ADL       Grooming: Set up;Wash/dry hands;Wash/dry face;Sitting                               Functional mobility during ADLs: Min guard General ADL Comments: HHA, No RW in room                       Cognition Arousal/Alertness: Awake/alert Behavior During Therapy: WFL for tasks assessed/performed Overall Cognitive Status: Within Functional Limits for tasks assessed                                                            Pertinent Vitals/ Pain       Pain Assessment: No/denies pain                                                          Frequency  Min 2X/week        Progress Toward Goals  OT Goals(current goals can now be found in the care  plan section)  Progress towards OT goals: Progressing toward goals  Acute Rehab OT Goals Patient Stated Goal: keep moving OT Goal Formulation: With patient Time For Goal Achievement: 12/30/20 Potential to Achieve Goals: Good  Plan Discharge plan remains appropriate    Co-evaluation                 AM-PAC OT "6 Clicks" Daily Activity     Outcome Measure   Help from another person eating meals?: None (using a spoon for Ice Chips) Help from another person taking care of personal grooming?: A Little Help from another person toileting, which includes using toliet, bedpan, or urinal?: A Little Help from another person bathing (including washing, rinsing, drying)?: A Little Help from another person to put on and taking off regular upper body clothing?: A Little Help from another person to put on and taking off regular lower body clothing?: A Little 6 Click Score: 19    End of Session Equipment Utilized During Treatment: Oxygen  OT Visit Diagnosis: Unsteadiness on feet (R26.81);Muscle weakness (generalized) (M62.81)   Activity Tolerance Patient tolerated treatment  well   Patient Left in chair;with call bell/phone within reach   Nurse Communication Mobility status        Time: 6579-0383 OT Time Calculation (min): 18 min  Charges: OT General Charges $OT Visit: 1 Visit OT Treatments $Therapeutic Activity: 8-22 mins  12/19/2020  Rich, OTR/L  Acute Rehabilitation Services  Office:  [place] 12/19/2020, 11:44 AM

## 2020-12-19 NOTE — Progress Notes (Signed)
Informed by IR team that family wishes to proceed with G tube placement.   CT abd wo ordered, will review with IR radiologist once done.   Rakel Junio H Mariano Doshi PA-C 12/19/2020 1:16 PM

## 2020-12-19 NOTE — Progress Notes (Signed)
FPTS Brief Progress Note  S: Saw patient at bedside for nightly rounds. Patient was sleeping comfortably, so I did not wake her.  O: BP (!) 145/83 (BP Location: Left Arm)   Pulse 88   Temp 97.6 F (36.4 C) (Axillary)   Resp 17   Ht 5\' 4"  (1.626 m)   Wt 44.1 kg   SpO2 95%   BMI 16.69 kg/m   Gen: cachectic elderly female sleeping comfortably, NAD HEENT: Cortrak in place Resp: normal effort  A/P: Patient sleeping comfortably this evening. Vitals stable with SpO2 93% and RR 19 on room air. -Continue tube feeds, increase up to goal rate of 55cc/hr as tolerated. -Monitor BP, if persistently hypertensive can increase amlodipine -Orders reviewed. Labs for AM ordered, which was adjusted as needed.  -Remainder per daily progress note   Alcus Dad, MD 12/19/2020, 8:43 PM PGY-2, [place] [facility] Night Resident  Please page [phone] with questions.

## 2020-12-20 ENCOUNTER — Inpatient Hospital Stay (HOSPITAL_COMMUNITY): Payer: Medicare Other

## 2020-12-20 DIAGNOSIS — T148XXA Other injury of unspecified body region, initial encounter: Secondary | ICD-10-CM | POA: Diagnosis not present

## 2020-12-20 LAB — CBC
HCT: 28.3 % — ABNORMAL LOW (ref 36.0–46.0)
Hemoglobin: 8.9 g/dL — ABNORMAL LOW (ref 12.0–15.0)
MCH: 27.2 pg (ref 26.0–34.0)
MCHC: 31.4 g/dL (ref 30.0–36.0)
MCV: 86.5 fL (ref 80.0–100.0)
Platelets: 259 10*3/uL (ref 150–400)
RBC: 3.27 MIL/uL — ABNORMAL LOW (ref 3.87–5.11)
RDW: 13.2 % (ref 11.5–15.5)
WBC: 6.7 10*3/uL (ref 4.0–10.5)
nRBC: 0 % (ref 0.0–0.2)

## 2020-12-20 LAB — CULTURE, BLOOD (ROUTINE X 2)
Culture: NO GROWTH
Culture: NO GROWTH
Special Requests: ADEQUATE
Special Requests: ADEQUATE

## 2020-12-20 LAB — COMPREHENSIVE METABOLIC PANEL
ALT: 13 U/L (ref 0–44)
AST: 15 U/L (ref 15–41)
Albumin: 2.4 g/dL — ABNORMAL LOW (ref 3.5–5.0)
Alkaline Phosphatase: 42 U/L (ref 38–126)
Anion gap: 8 (ref 5–15)
BUN: 20 mg/dL (ref 8–23)
CO2: 23 mmol/L (ref 22–32)
Calcium: 9.3 mg/dL (ref 8.9–10.3)
Chloride: 118 mmol/L — ABNORMAL HIGH (ref 98–111)
Creatinine, Ser: 1.38 mg/dL — ABNORMAL HIGH (ref 0.44–1.00)
GFR, Estimated: 38 mL/min — ABNORMAL LOW (ref >60)
Glucose, Bld: 99 mg/dL (ref 70–99)
Potassium: 3.7 mmol/L (ref 3.5–5.1)
Sodium: 149 mmol/L — ABNORMAL HIGH (ref 135–145)
Total Bilirubin: 0.4 mg/dL (ref 0.3–1.2)
Total Protein: 5.6 g/dL — ABNORMAL LOW (ref 6.5–8.1)

## 2020-12-20 LAB — PHOSPHORUS
Phosphorus: 4.1 mg/dL (ref 2.5–4.6)
Phosphorus: 3.6 mg/dL (ref 2.5–4.6)

## 2020-12-20 LAB — GLUCOSE, CAPILLARY
Glucose-Capillary: 103 mg/dL — ABNORMAL HIGH (ref 70–99)
Glucose-Capillary: 105 mg/dL — ABNORMAL HIGH (ref 70–99)
Glucose-Capillary: 111 mg/dL — ABNORMAL HIGH (ref 70–99)
Glucose-Capillary: 117 mg/dL — ABNORMAL HIGH (ref 70–99)
Glucose-Capillary: 117 mg/dL — ABNORMAL HIGH (ref 70–99)
Glucose-Capillary: 122 mg/dL — ABNORMAL HIGH (ref 70–99)

## 2020-12-20 LAB — MAGNESIUM
Magnesium: 2.1 mg/dL (ref 1.7–2.4)
Magnesium: 2 mg/dL (ref 1.7–2.4)

## 2020-12-20 LAB — PROCALCITONIN: Procalcitonin: 6.58 ng/mL

## 2020-12-20 MED ORDER — SODIUM CHLORIDE 0.45 % IV SOLN: INTRAVENOUS | Status: DC

## 2020-12-20 MED ORDER — ACETAMINOPHEN 160 MG/5ML PO SOLN
650.0000 mg | Freq: Four times a day (QID) | ORAL | Status: DC | PRN
  Administered 2020-12-20: 650 mg
  Filled 2020-12-20: qty 20.3

## 2020-12-20 MED ORDER — ACETAMINOPHEN 650 MG RE SUPP: 650.0000 mg | Freq: Four times a day (QID) | RECTAL | Status: DC | PRN

## 2020-12-20 MED ORDER — DEXTROSE 10 % IV SOLN: INTRAVENOUS | Status: DC

## 2020-12-20 NOTE — Progress Notes (Signed)
Family Medicine Teaching Service Daily Progress Note Intern Pager: [phone]  Patient name: [name] [name] Medical record number: [id number] Date of birth: [birth year] Age: 80 y.o. Gender: female  Primary Care Provider: Maryella Shivers, MD Consultants: Palliative medicine Code Status: DNR, confirmed by patient and daughter  Pt Overview and Major Events to Date:  8/2-admitted  Assessment and Plan: Hassell Done is an 80 year old female admitted on 8/2 with shortness of breath.  Past medical history significant for COPD (not on oxygen at baseline), hypertension, hyperlipidemia, peripheral vascular disease.  Severe sepsis  acute hypoxic respiratory failure 2/2 pneumonia, unknown organism, possibly secondary to aspiration pneumonitis She continues to remain afebrile and hemodynamically stable.  WBC 6.7 this morning.  Procalcitonin elevated to 6.58 from 5.97 2 days prior.  -Continue IV Unasyn (day #3 of treatment), consider antibiotic transition at a later date -Blood cultures continue to show no growth x4 days -Maintaining O2 sats on 1 L, supplemental oxygen goals greater than 88%  Dysphagia  severe malnutrition Palliative medicine still on board.  Current plan is CorTrakn feeding tube.  She is continuing to be monitored for refeeding syndrome via electrolyte checks given prolonged poor intake.  Sodium elevated at 149 and chloride elevated to 118.  Potassium, phosphorus, magnesium within normal limits.  She has reached goal of Osmolite 55 mL/h. -Continue Osmolite at goal 55 mL/h -Switch to 1/2 NS at 40 mL/hr -Continue to monitor CMP, magnesium, phosphorus -Nutrition appreciate recommendations  Hypertension Blood pressures have improved as she has not been hypertensive since 8/6.  Last blood pressure 109/59.  Amlodipine 5 mg was restarted yesterday. -Continue metoprolol 12.5 mg twice daily crushed administered via core track feeding tube -Continue amlodipine 5 mg  Hypoglycemia CBG glucose  checks ranging from 111-122. -Change CBG checks to daily  AKI, renal superimposed on CKD 3B (CKD4 on admission) Creatinine continues to be monitored, currently stable, 1.38 today previously 1.39. -Continue maintenance IVF normal saline at 40 mL/h  Hematoma Patient developed hematoma after tourniquet was left on right forearm for unknown period of time.  She does not have any pain at this time but hematoma still present. - U/S right forearm for concern of phlebitis  FEN/GI: Osmolite tube feeding at 55 mL/h PPx: Heparin 5000 units every 8 hours Dispo:Home with home health  pending clinical improvement . Barriers include weakness.   Subjective:  Patient did not list any complaints today states that her sore throat has improved.  She denies any chest pain or difficulty breathing.  Objective: Temp:  [97.5 F (36.4 C)-98 F (36.7 C)] 98 F (36.7 C) (08/07 0739) Pulse Rate:  [75-89] 80 (08/07 0739) Resp:  [16-20] 20 (08/07 0739) BP: (95-157)/(58-83) 109/59 (08/07 0739) SpO2:  [92 %-100 %] 96 % (08/07 0739) Weight:  [49.1 kg] 49.1 kg (08/07 0354)  Vitals:   12/18/20 2020 12/18/20 2135 12/18/20 2344 12/19/20 0339  BP: (!) 185/74 139/81 (!) 147/64 (!) 166/70   12/19/20 0747 12/19/20 0824 12/19/20 1141 12/19/20 1555  BP: (!) 147/64 (!) 138/58 139/69 (!) 157/69   12/19/20 1933 12/20/20 0013 12/20/20 0337 12/20/20 0739  BP: (!) 145/83 95/60 133/64 (!) 109/59    Physical Exam: General: Cachectic, frail-appearing, speaks in low volume with raspy voice, able to have minimal conversation Cardiovascular: RRR, no murmurs auscultated Respiratory: Normal respiratory effort Abdomen: Shallow abdomen, soft, normoactive bowel sounds Extremities: 2+ radial pulses bilaterally, hematoma on right forearm,no rashes or lesions, cachectic extremities  Laboratory: Recent Labs  Lab 12/18/20 0037 12/19/20 0508 12/20/20 0458  WBC  7.9 7.2 6.7  HGB 8.7* 9.8* 8.9*  HCT 27.5* 31.5* 28.3*  PLT 281 285  259   Recent Labs  Lab 12/18/20 0037 12/19/20 0508 12/20/20 0458  NA 148* 146* 149*  K 3.6 3.5 3.7  CL 118* 114* 118*  CO2 25 23 23   BUN 17 17 20   CREATININE 1.33* 1.39* 1.38*  CALCIUM 10.1 9.7 9.3  PROT 6.1* 6.0* 5.6*  BILITOT 0.8 0.3 0.4  ALKPHOS 47 50 42  ALT 16 13 13   AST 14* 17 15  GLUCOSE 124* 117* 99   CBG (last 3)  Recent Labs    12/20/20 0018 12/20/20 0352 12/20/20 0735  GLUCAP 117* 111* 122*    Imaging/Diagnostic Tests: No new imaging/tests.  Wells Guiles, DO 12/20/2020, 8:08 AM PGY-1, [place] Intern pager: [phone], text pages welcome

## 2020-12-20 NOTE — Progress Notes (Signed)
Patient called via call bell stating that she needed to use the restroom, RN went to assist patient with restroom, then patient advised that her right arm was hurting.  RN assessed patient's right arm and found an orange tourniquet still applied tightly to the patient's right arm above her elbow.  RN released tourniquet, hematoma was present on patient's forearm, heat pack was applied to the patient's arm.  Notified provider on call of situation - no new orders received

## 2020-12-20 NOTE — Progress Notes (Signed)
VASCULAR LAB    Right upper extremity venous duplex has been performed.  See CV proc for preliminary results.   Angad Nabers, RVT 12/20/2020, 4:16 PM

## 2020-12-21 LAB — CBC
HCT: 28.2 % — ABNORMAL LOW (ref 36.0–46.0)
Hemoglobin: 8.8 g/dL — ABNORMAL LOW (ref 12.0–15.0)
MCH: 27.2 pg (ref 26.0–34.0)
MCHC: 31.2 g/dL (ref 30.0–36.0)
MCV: 87 fL (ref 80.0–100.0)
Platelets: 277 10*3/uL (ref 150–400)
RBC: 3.24 MIL/uL — ABNORMAL LOW (ref 3.87–5.11)
RDW: 13.2 % (ref 11.5–15.5)
WBC: 7.5 10*3/uL (ref 4.0–10.5)
nRBC: 0 % (ref 0.0–0.2)

## 2020-12-21 LAB — COMPREHENSIVE METABOLIC PANEL
ALT: 16 U/L (ref 0–44)
AST: 18 U/L (ref 15–41)
Albumin: 2.4 g/dL — ABNORMAL LOW (ref 3.5–5.0)
Alkaline Phosphatase: 39 U/L (ref 38–126)
Anion gap: 5 (ref 5–15)
BUN: 19 mg/dL (ref 8–23)
CO2: 27 mmol/L (ref 22–32)
Calcium: 9.5 mg/dL (ref 8.9–10.3)
Chloride: 116 mmol/L — ABNORMAL HIGH (ref 98–111)
Creatinine, Ser: 1.31 mg/dL — ABNORMAL HIGH (ref 0.44–1.00)
GFR, Estimated: 41 mL/min — ABNORMAL LOW (ref >60)
Glucose, Bld: 110 mg/dL — ABNORMAL HIGH (ref 70–99)
Potassium: 3.6 mmol/L (ref 3.5–5.1)
Sodium: 148 mmol/L — ABNORMAL HIGH (ref 135–145)
Total Bilirubin: 0.3 mg/dL (ref 0.3–1.2)
Total Protein: 5.9 g/dL — ABNORMAL LOW (ref 6.5–8.1)

## 2020-12-21 LAB — GLUCOSE, CAPILLARY
Glucose-Capillary: 105 mg/dL — ABNORMAL HIGH (ref 70–99)
Glucose-Capillary: 110 mg/dL — ABNORMAL HIGH (ref 70–99)
Glucose-Capillary: 115 mg/dL — ABNORMAL HIGH (ref 70–99)
Glucose-Capillary: 117 mg/dL — ABNORMAL HIGH (ref 70–99)
Glucose-Capillary: 118 mg/dL — ABNORMAL HIGH (ref 70–99)
Glucose-Capillary: 120 mg/dL — ABNORMAL HIGH (ref 70–99)

## 2020-12-21 LAB — PHOSPHORUS
Phosphorus: 3.5 mg/dL (ref 2.5–4.6)
Phosphorus: 3.8 mg/dL (ref 2.5–4.6)

## 2020-12-21 LAB — MAGNESIUM
Magnesium: 1.9 mg/dL (ref 1.7–2.4)
Magnesium: 1.8 mg/dL (ref 1.7–2.4)

## 2020-12-21 LAB — PROCALCITONIN: Procalcitonin: 6.17 ng/mL

## 2020-12-21 MED ORDER — ENOXAPARIN SODIUM 30 MG/0.3ML IJ SOSY
30.0000 mg | PREFILLED_SYRINGE | Freq: Every day | INTRAMUSCULAR | Status: DC
  Administered 2020-12-23 – 2020-12-25 (×3): 30 mg via SUBCUTANEOUS
  Filled 2020-12-21 (×3): qty 0.3

## 2020-12-21 MED ORDER — MAGNESIUM SULFATE 2 GM/50ML IV SOLN
2.0000 g | Freq: Once | INTRAVENOUS | Status: AC
  Administered 2020-12-21: 2 g via INTRAVENOUS
  Filled 2020-12-21: qty 50

## 2020-12-21 NOTE — Progress Notes (Signed)
Family Medicine Teaching Service Daily Progress Note Intern Pager: [phone]  Patient name: [name] [name] Medical record number: [id number] Date of birth: 1940-12-20 Age: 80 y.o. Gender: female  Primary Care Provider: Maryella Shivers, MD Consultants: Palliative medicine Code Status: DNR, confirmed by patient and daughter  Pt Overview and Major Events to Date:  8/2-admitted   Assessment and Plan: [name] [name] is an 80 y/o F with PMHx of COPD (not on oxygen at baseline), hypertension, hyperlipidemia, PVD who presented with shortness of breath on 8/2.  Severe sepsis  acute hypoxic respiratory failure 2/2 pneumonia, unknown organism, possibly secondary to aspiration pneumonitis Continues to remain afebrile hemodynamically stable.  Procalcitonin downtrending from 6.58 to 6.17. -Continuing IV Unasyn (day #4 of treatment), will transition once PEG tube is placed to oral through PEG tube for total 10 day treatment. Today marks day 6 of total abx. Would transition to augmentin. -Continue O2 sats on 2 L. Wean as tolerated. Keep O2 > 88-94%  Dysphagia  severe malnutrition Her feedings were held as she needed to be n.p.o. for PEG tube placement but would not be receiving PEG tube until tomorrow due to scheduling.  We will restart feedings today.  Potassium, phosphorus, magnesium within normal limits -Restart Osmolite at goal 55 mL/h, n.p.o. at midnight in anticipation of PEG tube placement tomorrow -Switch to oral regimen once PEG is placed -Continue to monitor CMP, magnesium, phosphorus -palliative medicine and nutrition continue to follow  Hypertension Medications were held as they were being given via CorTrak and patient needed to be NPO for PEG tube procedure.  Patient's blood pressures have been elevated since then (176/75).  We will restart medications as PEG tube placement will not be today as previously expected. -Metoprolol 12.5 mg twice daily, amlodipine 5 mg daily  AKA,  superimposed on CKD 3B (CKD 4 on admission) Creatinine 1.31 from previously 1.38.  No longer receiving fluids as she is receiving feedings at goal rate.  Hematoma Duplex ultrasound from yesterday unremarkable.  Hematomas decreased in size today. Not bothering patient at this time. No changes to be made.  FEN/GI: Osmolite tube feeding at 55 mL/h PPx: Heparin 5000 units every 8 hours Dispo:Home with home health  pending clinical improvement . Barriers include weakness.   Subjective:  Patient states that she is just improving and nursing reports she was able to walk with assistance to the bathroom.  Objective: Temp:  [98 F (36.7 C)-98.4 F (36.9 C)] 98.2 F (36.8 C) (08/08 0444) Pulse Rate:  [80-91] 83 (08/08 0752) Resp:  [18-21] 19 (08/08 0752) BP: (125-176)/(62-75) 176/75 (08/08 0752) SpO2:  [94 %-99 %] 98 % (08/08 0752) Weight:  [44.9 kg] 44.9 kg (08/08 0444) Physical Exam: General: Well-appearing, frail and cachectic, no acute distress Cardiovascular: RRR, no murmurs auscultated Respiratory: Normal respiratory effort, no crackles or wheezing Abdomen: Shallow abdomen, soft, normoactive bowel sounds, nontender Extremities: 2+ radial pulses bilaterally, minimally noticeable hematoma on right forearm, cachectic extremities  Laboratory: Recent Labs  Lab 12/19/20 0508 12/20/20 0458 12/21/20 0719  WBC 7.2 6.7 7.5  HGB 9.8* 8.9* 8.8*  HCT 31.5* 28.3* 28.2*  PLT 285 259 277   Recent Labs  Lab 12/19/20 0508 12/20/20 0458 12/21/20 0719  NA 146* 149* 148*  K 3.5 3.7 3.6  CL 114* 118* 116*  CO2 23 23 27   BUN 17 20 19   CREATININE 1.39* 1.38* 1.31*  CALCIUM 9.7 9.3 9.5  PROT 6.0* 5.6* 5.9*  BILITOT 0.3 0.4 0.3  ALKPHOS 50 42 39  ALT  13 13 16   AST 17 15 18   GLUCOSE 117* 99 110*    Imaging/Diagnostic Tests: No new imaging/tests performed.  Wells Guiles, DO 12/21/2020, 9:09 AM PGY-1, [place] Intern pager: [phone], text pages welcome

## 2020-12-21 NOTE — Progress Notes (Signed)
Occupational Therapy Treatment Patient Details Name: [name] [name] MRN: [id number] DOB: 05/29/1940 Today's Date: 12/21/2020    History of present illness Pt is an 80 y.o. female presenting to ED 8/2 with shortness of breath. Admitted for acute hypoxic respiratory failure likely 2/2 suspected aspiration pneumonia and severe sepsis. Past medical history significant for COPD (not on oxygen at baseline), hypertension, hyperlipidemia & PVD.   OT comments  Pt supervised to EOB, ambulated to bathroom and sink pushing IV pole with min guard assist. Remained up in chair at end of session. Pt with Sp02 of 94% with [place] in her nose, but no 02 flowing.   Follow Up Recommendations  Home health OT;Supervision - Intermittent    Equipment Recommendations  Tub/shower seat    Recommendations for Other Services      Precautions / Restrictions Precautions Precautions: Fall Precaution Comments: monitor O2, Cortrak       Mobility Bed Mobility Overal bed mobility: Needs Assistance Bed Mobility: Supine to Sit     Supine to sit: Supervision     General bed mobility comments: for lines and safety, increased time    Transfers Overall transfer level: Needs assistance   Transfers: Sit to/from Stand Sit to Stand: Min guard         General transfer comment: min guard for safety and multiple lines    Balance Overall balance assessment: Needs assistance   Sitting balance-Leahy Scale: Good     Standing balance support: No upper extremity supported Standing balance-Leahy Scale: Fair Standing balance comment: fair static balance at sink, poor with ambulation requiring at least one hand support                           ADL either performed or assessed with clinical judgement   ADL Overall ADL's : Needs assistance/impaired Eating/Feeding: Independent;Sitting Eating/Feeding Details (indicate cue type and reason): ice chips Grooming: Wash/dry hands;Standing;Min guard            Upper Body Dressing : Set up;Sitting       Toilet Transfer: Min guard;Ambulation;Regular Museum/gallery exhibitions officer and Hygiene: Min guard;Sit to/from stand       Functional mobility during ADLs: Min guard General ADL Comments: pushed IV pole     Vision       Perception     Praxis      Cognition Arousal/Alertness: Awake/alert Behavior During Therapy: WFL for tasks assessed/performed Overall Cognitive Status: Within Functional Limits for tasks assessed                                          Exercises     Shoulder Instructions       General Comments      Pertinent Vitals/ Pain       Pain Assessment: No/denies pain  Home Living                                          Prior Functioning/Environment              Frequency  Min 2X/week        Progress Toward Goals  OT Goals(current goals can now be found in the care plan section)  Progress towards OT goals: Progressing toward goals  Acute Rehab OT Goals Patient Stated Goal: keep moving OT Goal Formulation: With patient Time For Goal Achievement: 12/30/20 Potential to Achieve Goals: Good  Plan Discharge plan remains appropriate    Co-evaluation                 AM-PAC OT "6 Clicks" Daily Activity     Outcome Measure   Help from another person eating meals?: None Help from another person taking care of personal grooming?: A Little Help from another person toileting, which includes using toliet, bedpan, or urinal?: A Little Help from another person bathing (including washing, rinsing, drying)?: A Little Help from another person to put on and taking off regular upper body clothing?: A Little Help from another person to put on and taking off regular lower body clothing?: A Little 6 Click Score: 19    End of Session    OT Visit Diagnosis: Unsteadiness on feet (R26.81);Muscle weakness (generalized) (M62.81)   Activity Tolerance  Patient tolerated treatment well   Patient Left in chair;with call bell/phone within reach   Nurse Communication Mobility status        Time: 1325-1346 OT Time Calculation (min): 21 min  Charges: OT General Charges $OT Visit: 1 Visit OT Treatments $Self Care/Home Management : 8-22 mins  Nestor Lewandowsky, OTR/L Acute Rehabilitation Services Pager: [phone] Office: [phone]    Malka So 12/21/2020, 2:29 PM

## 2020-12-21 NOTE — Consult Note (Signed)
Chief Complaint: Patient was seen in consultation today for percutaneous gastric tube placement Chief Complaint  Patient presents with   Shortness of Breath   at the request of Dr Su Ley   Supervising Physician: Corrie Mckusick  Patient Status: [facility] - In-pt  History of Present Illness: [name] [name] is a 80 y.o. female   COPD; HTN; PVD Esophageal cancer- radiation Admitted with PNA Covid neg Dysphagia; malnutrition Wt loss  Palliative note: Speech-language pathology was consulted and found severe pharyngeal dysphagia with minimal entrance in the cervical esophagus, impaired mobility throughout the pharynx and larynx with changes that could be consistent with postradiation dysphagia.  Noted little to no movement despite cues and noted aspiration during swallow.  Based on these findings, recommended n.p.o. and palliative care consult.  Request for percutaneous gastric tube placement Pt and family aware and agreeable Imaging approved for anatomy amenable to placement  Planned tentatively for IR schedule 12/22/20    Past Medical History:  Diagnosis Date   COPD (chronic obstructive pulmonary disease) (HCC)    Depression    Exertional dyspnea    HLD (hyperlipidemia)    HTN (hypertension)    Hypothyroidism    PVD (peripheral vascular disease) (HCC)    occluded right subclavian artery. asymptomatic.     Past Surgical History:  Procedure Laterality Date   hysterectomy - unknown type     THROAT SURGERY      Allergies: Patient has no known allergies.  Medications: Prior to Admission medications   Medication Sig Start Date End Date Taking? Authorizing Provider  albuterol (VENTOLIN HFA) 108 (90 Base) MCG/ACT inhaler Inhale 2 puffs into the lungs every 6 (six) hours as needed. 11/09/20  Yes [provider]  amLODipine (NORVASC) 5 MG tablet Take 5 mg by mouth daily. 09/18/20  Yes [provider]  Dextromethorphan-guaiFENesin (WAL-TUSSIN COUGH/CHEST DM  MAX PO) Take 20 mLs by mouth every 4 (four) hours.   Yes [provider]  levothyroxine (SYNTHROID) 125 MCG tablet Take 125 mcg by mouth daily. 11/03/20  Yes [provider]  metoprolol succinate (TOPROL-XL) 25 MG 24 hr tablet Take 25 mg by mouth daily. 09/18/20  Yes [provider]  mirtazapine (REMERON) 15 MG tablet Take 15 mg by mouth at bedtime. 11/18/20  Yes [provider]  rosuvastatin (CRESTOR) 40 MG tablet Take 40 mg by mouth daily.     Yes [provider]  tiotropium (SPIRIVA) 18 MCG inhalation capsule Place 18 mcg into inhaler and inhale daily.     Yes [provider]     History reviewed. No pertinent family history.  Social History   Socioeconomic History   Marital status: Single    Spouse name: Not on file   Number of children: 1   Years of education: Not on file   Highest education level: Not on file  Occupational History   Occupation: retired  Tobacco Use   Smoking status: Former    Types: Cigarettes    Quit date: 05/16/1994    Years since quitting: 26.6   Smokeless tobacco: Never  Substance and Sexual Activity   Alcohol use: No   Drug use: No   Sexual activity: Not on file  Other Topics Concern   Not on file  Social History Narrative   Not on file   Social Determinants of Health   Financial Resource Strain: Not on file  Food Insecurity: Not on file  Transportation Needs: Not on file  Physical Activity: Not on file  Stress: Not on file  Social Connections: Not on file    Review of Systems: A 12 point ROS discussed and pertinent positives are indicated in the HPI above.  All other systems are negative.    Vital Signs: BP (!) 176/75   Pulse 83   Temp 98.2 F (36.8 C) (Oral)   Resp 19   Ht 5\' 4"  (1.626 m)   Wt 98 lb 15.8 oz (44.9 kg)   SpO2 98%   BMI 16.99 kg/m   Physical Exam HENT:     Mouth/Throat:     Mouth: Mucous membranes are moist.  Cardiovascular:     Rate and Rhythm: Normal rate and  regular rhythm.  Pulmonary:     Effort: Pulmonary effort is normal.  Abdominal:     Palpations: Abdomen is soft.  Skin:    General: Skin is warm.  Neurological:     General: No focal deficit present.     Mental Status: She is alert.  Psychiatric:     Comments: Spoke to Dtr [name] [name] via phone about procedure She consents    Imaging: CT ABDOMEN WO CONTRAST  Result Date: 12/20/2020 CLINICAL DATA:  80 year old female, evaluate for percutaneous gastrostomy placement. EXAM: CT ABDOMEN WITHOUT CONTRAST TECHNIQUE: Multidetector CT imaging of the abdomen was performed following the standard protocol without IV contrast. COMPARISON:  None. FINDINGS: Lower chest: Improving but persistent left basilar subpleural consolidative opacity. The heart is normal in size. No pericardial effusion. Hepatobiliary: No focal liver abnormality is seen. Status post cholecystectomy. No biliary dilatation. Pancreas: Unremarkable. No pancreatic ductal dilatation or surrounding inflammatory changes. Spleen: Normal in size without focal abnormality. Adrenals/Urinary Tract: Adrenal glands are unremarkable. Persistent fetal lobulation of kidneys bilaterally with mild bilateral symmetric renal atrophy. There are multifocal atherosclerotic calcifications of the renal arteries, difficult to discern if nephrolithiasis is present. No hydronephrosis. Stomach/Bowel: Stomach is within normal limits. There is an acceptable percutaneous window for gastrostomy placement. Enteric feeding tube is in place coursing through the stomach with the tip terminating in the first portion of the duodenum. No evidence of bowel wall thickening, distention, or inflammatory changes. Vascular/Lymphatic: Extensive atherosclerotic calcifications. The abdominal aorta is normal in caliber. No abdominal lymphadenopathy. Other: No abdominal wall hernia or abnormality. Musculoskeletal: No acute or significant osseous findings. IMPRESSION: 1. Satisfactory anatomic  window for percutaneous gastrostomy tube placement. 2. Improving left basilar pulmonary consolidative opacity. 3.  Aortic Atherosclerosis (ICD10-I70.0). Ruthann Cancer, MD [facility] Radiology Electronically Signed   By: Ruthann Cancer MD   On: 12/20/2020 14:26   CT SOFT TISSUE NECK W CONTRAST  Result Date: 12/17/2020 CLINICAL DATA:  Head/neck cancer, surveillance; history of laryngeal cancer EXAM: CT NECK WITH CONTRAST TECHNIQUE: Multidetector CT imaging of the neck was performed using the standard protocol following the bolus administration of intravenous contrast. CONTRAST:  65mL OMNIPAQUE IOHEXOL 350 MG/ML SOLN COMPARISON:  2017 CT neck FINDINGS: Pharynx and larynx: Nasopharynx and oropharynx are unremarkable. Stable appearance of larynx and hypopharynx. There is stable vocal cord asymmetry. Unchanged sclerosis and irregularity of the thyroid cartilage. Airway is patent. There is some debris within the trachea. Salivary glands: Parotid glands are unremarkable. Atrophic or absent submandibular glands. Thyroid: Small in size. Lymph nodes: No new enlarged or abnormal density nodes. 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 Major neck vessels are patent. There is calcified plaque at the common carotid bifurcations. Limited intracranial: No abnormal enhancement. Visualized orbits: Minimally included portions are unremarkable. Mastoids and visualized  paranasal sinuses: No significant opacification. Skeleton: Degenerative changes of the cervical spine, primarily right-sided facet degeneration. Upper chest: Mild emphysema. Other: None. IMPRESSION: No substantial change since 2017 examination. Post treatment changes without evidence of recurrent disease. Electronically Signed   By: Macy Mis M.D.   On: 12/17/2020 16:07   CT Angio Chest PE W/Cm &/Or Wo Cm  Result Date: 12/15/2020 CLINICAL DATA:  PE suspected, shortness of breath EXAM: CT  ANGIOGRAPHY CHEST WITH CONTRAST TECHNIQUE: Multidetector CT imaging of the chest was performed using the standard protocol during bolus administration of intravenous contrast. Multiplanar CT image reconstructions and MIPs were obtained to evaluate the vascular anatomy. CONTRAST:  2mL OMNIPAQUE IOHEXOL 350 MG/ML SOLN COMPARISON:  11/14/2020 FINDINGS: Cardiovascular: Satisfactory opacification of the pulmonary arteries to the segmental level. No evidence of pulmonary embolism. Normal heart size. Three-vessel coronary artery calcifications. No pericardial effusion. Aortic atherosclerosis. Mediastinum/Nodes: No enlarged mediastinal, hilar, or axillary lymph nodes. Thyroid gland, trachea, and esophagus demonstrate no significant findings. Lungs/Pleura: Mild centrilobular emphysema. Redemonstrated heterogeneous airspace opacity of the left greater than right lung bases, similar in appearance to prior examination. Stable, benign fissural nodule of the right upper lobe abutting the minor fissure (series 6, image 88). No pleural effusion or pneumothorax. Upper Abdomen: No acute abnormality. Musculoskeletal: No chest wall abnormality. No acute or significant osseous findings. Review of the MIP images confirms the above findings. IMPRESSION: 1. Negative examination for pulmonary embolism. 2. Redemonstrated heterogeneous airspace opacity of the left greater than right lung bases, similar in appearance to prior examination and consistent with infection or aspiration, which may be ongoing. 3. Emphysema. 4. Coronary artery disease. Aortic Atherosclerosis (ICD10-I70.0) and Emphysema (ICD10-J43.9). Electronically Signed   By: Eddie Candle M.D.   On: 12/15/2020 13:08   DG Chest Port 1 View  Result Date: 12/16/2020 CLINICAL DATA:  80 year old female with chest pain. EXAM: PORTABLE CHEST 1 VIEW COMPARISON:  Chest radiograph dated 12/15/2020 and CT dated 12/15/2020 FINDINGS: Background of emphysema. Left lung base infiltrate as seen  on the prior CT. No pleural effusion, or pneumothorax. There is a 3 mm right upper lobe calcified granuloma. The cardiac silhouette is within limits. Atherosclerotic calcification of the aorta. Osteopenia. No acute osseous pathology. IMPRESSION: Left lung base infiltrate as seen on the prior CT. Electronically Signed   By: Anner Crete M.D.   On: 12/16/2020 19:54   DG Chest Port 1 View  Result Date: 12/15/2020 CLINICAL DATA:  Shortness of breath. EXAM: PORTABLE CHEST 1 VIEW COMPARISON:  CT 11/14/2020.  Chest x-ray 11/14/2020. FINDINGS: Mediastinum and hilar structures normal. Heart size stable. Persistent mild left base atelectasis/infiltrate with improved aeration from prior exam. Mild right base subsegmental atelectasis. Calcified right upper lung pulmonary nodule again noted consistent with old calcified granuloma. No pleural effusion or pneumothorax. Mild thoracic spine scoliosis. No acute bony abnormality. IMPRESSION: Persistent mild left base atelectasis/infiltrate with improved aeration from prior exam. Mild right base subsegmental atelectasis. Electronically Signed   By: Marcello Moores  Register   On: 12/15/2020 08:25   DG Abd Portable 1V  Result Date: 12/18/2020 CLINICAL DATA:  NG tube placement EXAM: PORTABLE ABDOMEN - 1 VIEW COMPARISON:  Chest radiograph, 12/16/20. FINDINGS: Enteric feeding tube with tip projecting to the right of midline, likely at the duodenal bulb. The imaged bowel is not obstructed. Left basilar infiltrate, best appreciated on comparison chest radiograph. Aortic vascular calcifications. No acute osseous abnormality. IMPRESSION: Transpyloric enteric feeding tube. Electronically Signed   By: Michaelle Birks MD   On:  12/18/2020 16:44   DG Swallowing Func-Speech Pathology  Result Date: 12/16/2020 Formatting of this result is different from the original. Objective Swallowing Evaluation: Type of Study: MBS-Modified Barium Swallow Study  Patient Details Name: [name] [name] MRN: [id number]  Date of Birth: February 27, 1941 Today's Date: 12/16/2020 Time: SLP Start Time (ACUTE ONLY): 1148 -SLP Stop Time (ACUTE ONLY): 1157 SLP Time Calculation (min) (ACUTE ONLY): 9 min Past Medical History: Past Medical History: Diagnosis Date  COPD (chronic obstructive pulmonary disease) (HCC)   Depression   Exertional dyspnea   HLD (hyperlipidemia)   HTN (hypertension)   Hypothyroidism   PVD (peripheral vascular disease) ([place])   occluded right subclavian artery. asymptomatic.  Past Surgical History: Past Surgical History: Procedure Laterality Date  hysterectomy - unknown type    THROAT SURGERY   HPI: Pt is an 80 yo female presenting with SOB. Pt found to have severe sepsis from suspected aspiration PNA. She was recently admitted to another hospital 7/2-7/4 for PNA and per family report has had increased phlegm production and decreased PO intake due to N/V since that time. PMH also includes: throat ca s/p XRT and surgery, COPD, exertional dyspnea, HLD, HTN, hypothyroidism, PVD  Subjective: pt alert, cooperative Assessment / Plan / Recommendation CHL IP CLINICAL IMPRESSIONS 12/16/2020 Clinical Impression Pt has a severe pharyngeal dysphagia with minimal entrance into the cervical esophagus. She has signifnicantly impaired mobility throughout her pharynx and larynx, with changes that could be consistent with post-radiation dysphagia. Little to no movement is noted despite cues to swallow as effortfully as possible. She aspirates during the swallow as she cannot obtain full airway closure. The UES has very little opening, so that only trace amounts of boluses can enter the esophagus, leaving the remainder of the bolus in the pharynx. This is also aspirated after the swallow as she tries to perform subswallows to clear it, but pt also had to expectorate the majority of what she was given. She can bring this anteriorly in her oral cavity, but also needs help clearing it from her mouth, as it appeasr to be mixed with very thick secretions  that were potentially in her pharynx originally. Suspect that pt it likely to have a more chronic dysphagia that may have been more acutely exacerbated by recent illness and deconditioning, although note that MD w/u is still underway to determine if there could be any other acute causes. Given the concern for persistent swallowing difficulties as well as the severity of her dysphagia, recommend palliative care consult. SLP Visit Diagnosis Dysphagia, pharyngoesophageal phase (R13.14) Attention and concentration deficit following -- Frontal lobe and executive function deficit following -- Impact on safety and function Severe aspiration risk;Risk for inadequate nutrition/hydration   CHL IP TREATMENT RECOMMENDATION 12/16/2020 Treatment Recommendations Therapy as outlined in treatment plan below   Prognosis 12/16/2020 Prognosis for Safe Diet Advancement Guarded Barriers to Reach Goals Time post onset;Severity of deficits Barriers/Prognosis Comment -- CHL IP DIET RECOMMENDATION 12/16/2020 SLP Diet Recommendations NPO Liquid Administration via -- Medication Administration Via alternative means Compensations -- Postural Changes --   CHL IP OTHER RECOMMENDATIONS 12/16/2020 Recommended Consults -- Oral Care Recommendations Oral care QID Other Recommendations Have oral suction available   CHL IP FOLLOW UP RECOMMENDATIONS 12/16/2020 Follow up Recommendations (No Data)   CHL IP FREQUENCY AND DURATION 12/16/2020 Speech Therapy Frequency (ACUTE ONLY) min 2x/week Treatment Duration 2 weeks      No flowsheet data found. CHL IP PHARYNGEAL PHASE 12/16/2020 Pharyngeal Phase Impaired Pharyngeal- Pudding Teaspoon -- Pharyngeal -- Pharyngeal- Pudding  Cup -- Pharyngeal -- Pharyngeal- Honey Teaspoon -- Pharyngeal -- Pharyngeal- Honey Cup -- Pharyngeal -- Pharyngeal- Nectar Teaspoon Reduced pharyngeal peristalsis;Reduced epiglottic inversion;Reduced anterior laryngeal mobility;Reduced laryngeal elevation;Reduced airway/laryngeal closure;Reduced tongue  base retraction;Penetration/Aspiration during swallow;Penetration/Apiration after swallow;Pharyngeal residue - valleculae;Pharyngeal residue - pyriform Pharyngeal Material enters airway, passes BELOW cords without attempt by patient to eject out (silent aspiration) Pharyngeal- Nectar Cup -- Pharyngeal -- Pharyngeal- Nectar Straw -- Pharyngeal -- Pharyngeal- Thin Teaspoon Reduced pharyngeal peristalsis;Reduced epiglottic inversion;Reduced anterior laryngeal mobility;Reduced laryngeal elevation;Reduced airway/laryngeal closure;Reduced tongue base retraction;Penetration/Aspiration during swallow;Penetration/Apiration after swallow;Pharyngeal residue - valleculae;Pharyngeal residue - pyriform Pharyngeal Material enters airway, passes BELOW cords and not ejected out despite cough attempt by patient Pharyngeal- Thin Cup -- Pharyngeal -- Pharyngeal- Thin Straw -- Pharyngeal -- Pharyngeal- Puree Reduced pharyngeal peristalsis;Reduced epiglottic inversion;Reduced anterior laryngeal mobility;Reduced laryngeal elevation;Reduced airway/laryngeal closure;Reduced tongue base retraction;Penetration/Aspiration during swallow;Penetration/Apiration after swallow;Pharyngeal residue - valleculae;Pharyngeal residue - pyriform Pharyngeal Material enters airway, passes BELOW cords and not ejected out despite cough attempt by patient Pharyngeal- Mechanical Soft -- Pharyngeal -- Pharyngeal- Regular -- Pharyngeal -- Pharyngeal- Multi-consistency -- Pharyngeal -- Pharyngeal- Pill -- Pharyngeal -- Pharyngeal Comment --  CHL IP CERVICAL ESOPHAGEAL PHASE 12/16/2020 Cervical Esophageal Phase Impaired Pudding Teaspoon -- Pudding Cup -- Honey Teaspoon -- Honey Cup -- Nectar Teaspoon Reduced cricopharyngeal relaxation Nectar Cup -- Nectar Straw -- Thin Teaspoon Reduced cricopharyngeal relaxation Thin Cup -- Thin Straw -- Puree Reduced cricopharyngeal relaxation Mechanical Soft -- Regular -- Multi-consistency -- Pill -- Cervical Esophageal Comment --  Osie Bond., M.A. CCC-SLP Acute Rehabilitation Services Pager [phone] Office [phone] 12/16/2020, 1:58 PM              VAS [place] UPPER EXTREMITY VENOUS DUPLEX  Result Date: 12/20/2020 UPPER VENOUS STUDY  Patient Name:  [name] [name]  Date of Exam:   12/20/2020 Medical Rec #: [id number]        Accession #:    [id number] Date of Birth: [birth year]         Patient Gender: F Patient Age:   46 years Exam Location:  [facility] Procedure:      VAS [place] UPPER EXTREMITY VENOUS DUPLEX Referring Phys: SARA NEAL --------------------------------------------------------------------------------  Indications: Hematoma, "knot," after tourniquet accidentally left on arm Limitations: Body habitus Comparison Study: No prior study Performing Technologist: Sharion Dove RVS  Examination Guidelines: A complete evaluation includes B-mode imaging, spectral Doppler, color Doppler, and power Doppler as needed of all accessible portions of each vessel. Bilateral testing is considered an integral part of a complete examination. Limited examinations for reoccurring indications may be performed as noted.  Right Findings: +----------+------------+---------+-----------+----------+---------------+ RIGHT     CompressiblePhasicitySpontaneousProperties    Summary     +----------+------------+---------+-----------+----------+---------------+ IJV           Full       Yes       Yes                              +----------+------------+---------+-----------+----------+---------------+ Subclavian    Full       Yes       Yes                              +----------+------------+---------+-----------+----------+---------------+ Axillary                 Yes       Yes                              +----------+------------+---------+-----------+----------+---------------+  Brachial      Full       Yes       Yes                              +----------+------------+---------+-----------+----------+---------------+  Radial        Full                                                  +----------+------------+---------+-----------+----------+---------------+ Ulnar                                               patent by color +----------+------------+---------+-----------+----------+---------------+ Cephalic      Full                                                  +----------+------------+---------+-----------+----------+---------------+ Basilic       Full                                                  +----------+------------+---------+-----------+----------+---------------+  Left Findings: +----------+------------+---------+-----------+----------+-------+ LEFT      CompressiblePhasicitySpontaneousPropertiesSummary +----------+------------+---------+-----------+----------+-------+ Subclavian               Yes       Yes                      +----------+------------+---------+-----------+----------+-------+  Summary:  Right: No evidence of deep vein thrombosis in the upper extremity. No evidence of superficial vein thrombosis in the upper extremity.  Left: No evidence of thrombosis in the subclavian.  *See table(s) above for measurements and observations.    Preliminary     Labs:  CBC: Recent Labs    12/18/20 0037 12/19/20 0508 12/20/20 0458 12/21/20 0719  WBC 7.9 7.2 6.7 7.5  HGB 8.7* 9.8* 8.9* 8.8*  HCT 27.5* 31.5* 28.3* 28.2*  PLT 281 285 259 277    COAGS: No results for input(s): INR, APTT in the last 8760 hours.  BMP: Recent Labs    12/18/20 0037 12/19/20 0508 12/20/20 0458 12/21/20 0719  NA 148* 146* 149* 148*  K 3.6 3.5 3.7 3.6  CL 118* 114* 118* 116*  CO2 25 23 23 27   GLUCOSE 124* 117* 99 110*  BUN 17 17 20 19   CALCIUM 10.1 9.7 9.3 9.5  CREATININE 1.33* 1.39* 1.38* 1.31*  GFRNONAA 40* 38* 38* 41*    LIVER FUNCTION TESTS: Recent Labs    12/18/20 0037 12/19/20 0508 12/20/20 0458 12/21/20 0719  BILITOT 0.8 0.3 0.4 0.3  AST 14* 17 15 18    ALT 16 13 13 16   ALKPHOS 47 50 42 39  PROT 6.1* 6.0* 5.6* 5.9*  ALBUMIN 2.5* 2.5* 2.4* 2.4*    TUMOR MARKERS: No results for input(s): AFPTM, CEA, CA199, CHROMGRNA in the last 8760 hours.  Assessment and Plan:  Esophageal cancer Dysphagia Wt loss; malnutrition Scheduled for percutaneous gastric tube placement in IR  Plan for 12/22/20 Risks and benefits image guided gastrostomy tube placement was discussed with the patient and Dtr [name] [name] via phone including, but not limited to the need for a barium enema during the procedure, bleeding, infection, peritonitis and/or damage to adjacent structures.  All were answered, patient and Dtr are agreeable to proceed.  Consent signed and in chart.   Thank you for this interesting consult.  I greatly enjoyed meeting [name] [name] and look forward to participating in their care.  A copy of this report was sent to the requesting provider on this date.  Electronically Signed: Lavonia Drafts, PA-C 12/21/2020, 9:21 AM   I spent a total of 40 Minutes    in face to face in clinical consultation, greater than 50% of which was counseling/coordinating care for percutaneous gastric tube placement

## 2020-12-21 NOTE — Progress Notes (Signed)
FPTS Interim Night Progress Note  S:Patient sleeping comfortably.  Rounded with primary night RN.  No concerns voiced.  No orders required.    O: Today's Vitals   12/20/20 0937 12/20/20 1150 12/20/20 1554 12/20/20 1940  BP:  125/64 (!) 143/72 (!) 145/62  Pulse: 86 80 88 91  Resp:  (!) 21 (!) 21 20  Temp:  98.2 F (36.8 C) 98 F (36.7 C) 98.4 F (36.9 C)  TempSrc:  Oral Oral Oral  SpO2:  96% 94% 94%  Weight:      Height:      PainSc:  0-No pain 0-No pain 0-No pain      A/P: Continue current management NPO> MN for PEG in am D10W at 55/hr until restart Osmolite at goal   Carollee Leitz MD PGY-3, [facility] Service pager [phone]

## 2020-12-22 ENCOUNTER — Inpatient Hospital Stay (HOSPITAL_COMMUNITY): Payer: Medicare Other

## 2020-12-22 HISTORY — PX: IR GASTROSTOMY TUBE MOD SED: IMG625

## 2020-12-22 LAB — COMPREHENSIVE METABOLIC PANEL
ALT: 18 U/L (ref 0–44)
AST: 24 U/L (ref 15–41)
Albumin: 2.4 g/dL — ABNORMAL LOW (ref 3.5–5.0)
Alkaline Phosphatase: 39 U/L (ref 38–126)
Anion gap: 5 (ref 5–15)
BUN: 18 mg/dL (ref 8–23)
CO2: 27 mmol/L (ref 22–32)
Calcium: 9.3 mg/dL (ref 8.9–10.3)
Chloride: 112 mmol/L — ABNORMAL HIGH (ref 98–111)
Creatinine, Ser: 1.26 mg/dL — ABNORMAL HIGH (ref 0.44–1.00)
GFR, Estimated: 43 mL/min — ABNORMAL LOW (ref >60)
Glucose, Bld: 99 mg/dL (ref 70–99)
Potassium: 4 mmol/L (ref 3.5–5.1)
Sodium: 144 mmol/L (ref 135–145)
Total Bilirubin: 0.5 mg/dL (ref 0.3–1.2)
Total Protein: 5.6 g/dL — ABNORMAL LOW (ref 6.5–8.1)

## 2020-12-22 LAB — GLUCOSE, CAPILLARY
Glucose-Capillary: 100 mg/dL — ABNORMAL HIGH (ref 70–99)
Glucose-Capillary: 138 mg/dL — ABNORMAL HIGH (ref 70–99)
Glucose-Capillary: 82 mg/dL (ref 70–99)
Glucose-Capillary: 88 mg/dL (ref 70–99)
Glucose-Capillary: 91 mg/dL (ref 70–99)
Glucose-Capillary: 94 mg/dL (ref 70–99)
Glucose-Capillary: 98 mg/dL (ref 70–99)

## 2020-12-22 LAB — CBC
HCT: 27.7 % — ABNORMAL LOW (ref 36.0–46.0)
Hemoglobin: 8.9 g/dL — ABNORMAL LOW (ref 12.0–15.0)
MCH: 27.7 pg (ref 26.0–34.0)
MCHC: 32.1 g/dL (ref 30.0–36.0)
MCV: 86.3 fL (ref 80.0–100.0)
Platelets: 296 10*3/uL (ref 150–400)
RBC: 3.21 MIL/uL — ABNORMAL LOW (ref 3.87–5.11)
RDW: 13.2 % (ref 11.5–15.5)
WBC: 8.1 10*3/uL (ref 4.0–10.5)
nRBC: 0 % (ref 0.0–0.2)

## 2020-12-22 LAB — PROTIME-INR
INR: 1.1 (ref 0.8–1.2)
Prothrombin Time: 13.8 seconds (ref 11.4–15.2)

## 2020-12-22 LAB — PHOSPHORUS: Phosphorus: 4 mg/dL (ref 2.5–4.6)

## 2020-12-22 LAB — MAGNESIUM: Magnesium: 2.2 mg/dL (ref 1.7–2.4)

## 2020-12-22 MED ORDER — MIDAZOLAM HCL 2 MG/2ML IJ SOLN
INTRAMUSCULAR | Status: AC | PRN
  Administered 2020-12-22: 0.5 mg via INTRAVENOUS
  Administered 2020-12-22: 1 mg via INTRAVENOUS
  Administered 2020-12-22: 0.5 mg via INTRAVENOUS

## 2020-12-22 MED ORDER — MIDAZOLAM HCL 2 MG/2ML IJ SOLN
INTRAMUSCULAR | Status: AC
  Filled 2020-12-22: qty 4

## 2020-12-22 MED ORDER — FENTANYL CITRATE (PF) 100 MCG/2ML IJ SOLN
INTRAMUSCULAR | Status: AC
  Filled 2020-12-22: qty 4

## 2020-12-22 MED ORDER — IOHEXOL 300 MG/ML  SOLN: 50.0000 mL | Freq: Once | INTRAMUSCULAR | Status: DC | PRN

## 2020-12-22 MED ORDER — BACITRACIN-NEOMYCIN-POLYMYXIN 400-5-5000 EX OINT
1.0000 "application " | TOPICAL_OINTMENT | Freq: Every day | CUTANEOUS | Status: DC
  Administered 2020-12-23 – 2020-12-25 (×3): 1 via TOPICAL
  Filled 2020-12-22 (×2): qty 1

## 2020-12-22 MED ORDER — LIDOCAINE HCL (PF) 1 % IJ SOLN
INTRAMUSCULAR | Status: AC | PRN
Start: 2020-12-22 — End: 2020-12-22
  Administered 2020-12-22: 5 mL

## 2020-12-22 MED ORDER — LIDOCAINE HCL (PF) 1 % IJ SOLN
INTRAMUSCULAR | Status: AC | PRN
  Administered 2020-12-22: 10 mL

## 2020-12-22 MED ORDER — GLUCAGON HCL RDNA (DIAGNOSTIC) 1 MG IJ SOLR
INTRAMUSCULAR | Status: AC
  Filled 2020-12-22: qty 1

## 2020-12-22 MED ORDER — FENTANYL CITRATE (PF) 100 MCG/2ML IJ SOLN
INTRAMUSCULAR | Status: AC | PRN
  Administered 2020-12-22: 25 ug via INTRAVENOUS
  Administered 2020-12-22 (×2): 50 ug via INTRAVENOUS
  Administered 2020-12-22: 25 ug via INTRAVENOUS

## 2020-12-22 MED ORDER — LIDOCAINE HCL 1 % IJ SOLN
INTRAMUSCULAR | Status: AC
  Filled 2020-12-22: qty 20

## 2020-12-22 MED ORDER — DEXTROSE-NACL 5-0.45 % IV SOLN: INTRAVENOUS | Status: DC

## 2020-12-22 NOTE — Progress Notes (Signed)
Family Medicine Teaching Service Daily Progress Note Intern Pager: [phone]  Patient name: [name] [name] Medical record number: [id number] Date of birth: [birth year] Age: 80 y.o. Gender: female  Primary Care Provider: Maryella Shivers, MD Consultants: Palliative medicine, IR Code Status: DNR  Pt Overview and Major Events to Date:  8/2-admitted  Assessment and Plan: [name] [name] is an 80 year old female with past medical history of COPD (not on oxygen at baseline), hypertension, HLD, PVD who presented with shortness of breath on 8/2.  Severe sepsis  acute hypoxic respiratory failure 2/2 pneumonia, unknown organism, possibly secondary to aspiration pneumonitis Continues to remain afebrile and hemodynamically stable.   -Continuing IV Unasyn (day #5 of treatment), will transition to oral Augmentin once PEG tube is placed for total of 10-day treatment.  Today marks day 7 of total antibiotics. -Keep O2 at 88-94% given history of COPD  Dysphagia  severe malnutrition Feedings were held at midnight for PEG tube placement today.  We will restart feedings after.  Potassium, phosphorus, magnesium within normal limits.  Discussed with IR and patient will be receiving PEG tube placement today. -Switch to PEG tube feedings once PEG tube is placed -Continue to monitor CMP, magnesium, phosphorus -Palliative medicine, nutrition continue to follow, appreciate recommendations -IR note from yesterday stated patient will be scheduled for PEG tube placement today  Hypertension Patient's hypertension seems to be correlated to not receiving blood pressure medications as noted yesterday and she was NPO.  Hypotension resolved with receiving medications upon removal of n.p.o. status.  As n.p.o. status was placed for PEG tube tube placement today, hypertension as expected.  -Continue to monitor blood pressure for severe ranges  AKA, superimposed on CKD 3B (CKD 4 on admission) Creatinine 1.26 from previously  1.31.  No changes at this time regarding management.  Hematoma Still present but improving.  No changes at this time regarding management.  FEN/GI: N.p.o. since midnight, to resume feeds after PEG tube placement PPx: Lovenox subq 30mg  daily Dispo:Home with home health  pending clinical improvement . Barriers include PEG tube placement and weakness.   Subjective:  Patient did not have any questions or concerns regarding management at this time.  She states her breathing is fine.  Objective: Temp:  [97.2 F (36.2 C)-98.1 F (36.7 C)] 97.4 F (36.3 C) (08/09 0420) Pulse Rate:  [83-97] 97 (08/09 0420) Resp:  [17-23] 19 (08/09 0420) BP: (110-176)/(52-75) 131/73 (08/09 0420) SpO2:  [94 %-98 %] 95 % (08/09 0420) Physical Exam: General: Frail, cachectic, no acute distress Cardiovascular: RRR, no murmurs auscultated Respiratory: Normal respiratory effort, no crackles or wheezing Abdomen: Shallow abdomen, soft, normoactive bowel sounds Extremities: 2+ radial pulses bilaterally, cachectic extremities  Laboratory: Recent Labs  Lab 12/20/20 0458 12/21/20 0719 12/22/20 0518  WBC 6.7 7.5 8.1  HGB 8.9* 8.8* 8.9*  HCT 28.3* 28.2* 27.7*  PLT 259 277 296   Recent Labs  Lab 12/20/20 0458 12/21/20 0719 12/22/20 0518  NA 149* 148* 144  K 3.7 3.6 4.0  CL 118* 116* 112*  CO2 23 27 27   BUN 20 19 18   CREATININE 1.38* 1.31* 1.26*  CALCIUM 9.3 9.5 9.3  PROT 5.6* 5.9* 5.6*  BILITOT 0.4 0.3 0.5  ALKPHOS 42 39 39  ALT 13 16 18   AST 15 18 24   GLUCOSE 99 110* 99    Imaging/Diagnostic Tests: No new imaging/test performed today  Wells Guiles, DO 12/22/2020, 6:38 AM PGY-1, [facility] Intern pager: [phone], text pages welcome

## 2020-12-22 NOTE — Progress Notes (Signed)
Tube feeding stopped @ [facility].

## 2020-12-22 NOTE — Progress Notes (Addendum)
12/22/2020 PT: based on earlier therapy note they were recommending [facility] only if the pt had 24/7 care at home.  Per MD, Dr. Madison Hickman, the pt does not have this level of assistance, so PT d/c recommendations updated to SNF.  I cannot currently addend the note as the therapist who saw her left for the day, but upon review of this case, SNF appears appropriate for rehab at discharge.     12/22/20 1626   PT - Assessment/Plan  PT Plan Discharge plan needs to be updated  PT Frequency (ACUTE ONLY) Min 2X/week  Follow Up Recommendations SNF (pt does not have 24/7 care at home.)  PT equipment 3in1 (PT)    Verdene Lennert, PT, DPT  Acute Rehabilitation Ortho Tech Supervisor [phone] pager [phone] office

## 2020-12-22 NOTE — Progress Notes (Signed)
FPTS Interim Night Progress Note  S:Patient sleeping comfortably.  Rounded with primary night RN.  No concerns voiced.  No orders required.    O: Today's Vitals   12/21/20 1520 12/21/20 1941 12/21/20 2359 12/22/20 0000  BP: (!) 171/73 (!) 158/62 110/61 110/61  Pulse: 90 93 92 96  Resp: 20 20 20  (!) 23  Temp: 97.8 F (36.6 C) 98 F (36.7 C) (!) 97.2 F (36.2 C) (!) 97.3 F (36.3 C)  TempSrc: Axillary Oral Oral Axillary  SpO2: 95% 95% 94% 95%  Weight:      Height:      PainSc: 0-No pain         A/P: NPO since MN Maintaining glucose Not scheduled on IR list today, will have day team follow up  Carollee Leitz MD PGY-3, [facility] Medicine Service pager [phone]

## 2020-12-22 NOTE — Progress Notes (Signed)
SLP Cancellation Note  Patient Details Name: [name] [name] MRN: [id number] DOB: January 05, 1941   Cancelled treatment:       Reason Eval/Treat Not Completed: Medical issues which prohibited therapy. Pt made NPO after midnight for potential PEG placement today. Will f/u as able.     Osie Bond., M.A. [facility] Acute Rehabilitation Services Pager [phone] Office [phone]  12/22/2020, 7:13 AM

## 2020-12-22 NOTE — Procedures (Signed)
Interventional Radiology Procedure Note  Procedure: Placement of percutaneous 20F pull-through gastrostomy tube. Complications: None Recommendations: - NPO except for sips and chips remainder of today and overnight - Maintain G-tube to LWS until tomorrow morning  - May advance diet as tolerated and begin using tube tomorrow morning  Signed,   Cartel Mauss S. Shenelle Klas, DO   

## 2020-12-22 NOTE — Progress Notes (Signed)
Spoke with daughter. Pt previously lived by herself. Discussed that [facility] generally is only able to help 2-3 days/week and if she would need more regular care, [facility] may be the better option. Daughter stated she was okay with that plan and thankful for update.

## 2020-12-22 NOTE — Progress Notes (Signed)
Physical Therapy Treatment Patient Details Name: [name] [name] MRN: [id number] DOB: 10-29-1940 Today's Date: 12/22/2020    History of Present Illness Pt is an 80 y.o. female presenting to ED 8/2 with shortness of breath. Admitted for acute hypoxic respiratory failure likely 2/2 suspected aspiration pneumonia and severe sepsis. Past medical history significant for COPD (not on oxygen at baseline), hypertension, hyperlipidemia & PVD.    PT Comments    Pt demonstrated increased ambulation distance compared to previous session, although limited by fatigue. VSS throughout. Pt began to become unsteady without support from IV pole when initially ambulating. Pt declined stair training today due to fatigue. Recommend use of RW with ambulation and progress to balance and stair training in upcoming sessions.   Follow Up Recommendations  Home health PT;Supervision/Assistance - 24 hour     Equipment Recommendations  3in1 (PT)    Recommendations for Other Services       Precautions / Restrictions Precautions Precautions: Fall Precaution Comments: monitor O2, Cortrak Restrictions Weight Bearing Restrictions: No    Mobility  Bed Mobility Overal bed mobility: Modified Independent             General bed mobility comments: HOB 10 degrees without assist    Transfers Overall transfer level: Needs assistance   Transfers: Sit to/from Stand Sit to Stand: Min guard         General transfer comment: min guard for safety and lines  Ambulation/Gait Ambulation/Gait assistance: Min assist Gait Distance (Feet): 150 Feet Assistive device: None;IV Pole Gait Pattern/deviations: Step-through pattern;Decreased stride length   Gait velocity interpretation: <1.8 ft/sec, indicate of risk for recurrent falls General Gait Details: Pt initially ambulated 61ft with min A and HHA. Pt began to have LOB and demonstrated improved balance with IV pole, limited by fatigue. Verbal cues provided to maintain  upright position.   Stairs             Wheelchair Mobility    Modified Rankin (Stroke Patients Only)       Balance Overall balance assessment: Needs assistance Sitting-balance support: No upper extremity supported;Feet unsupported Sitting balance-Leahy Scale: Good     Standing balance support: Single extremity supported Standing balance-Leahy Scale: Fair Standing balance comment: Pt maintained static balance without UE support; however, had difficulty with maintaining dynamic standing balance without external and unilateral UE support.                            Cognition Arousal/Alertness: Awake/alert Behavior During Therapy: WFL for tasks assessed/performed Overall Cognitive Status: Within Functional Limits for tasks assessed                                        Exercises General Exercises - Lower Extremity Long Arc Quad: AROM;Both;10 reps;Seated Hip Flexion/Marching: AROM;Both;Seated;10 reps    General Comments General comments (skin integrity, edema, etc.): SpO2 > 90s on RA      Pertinent Vitals/Pain Pain Assessment: No/denies pain    Home Living                      Prior Function            PT Goals (current goals can now be found in the care plan section) Progress towards PT goals: Progressing toward goals    Frequency    Min 3X/week  PT Plan Current plan remains appropriate    Co-evaluation              AM-PAC PT "6 Clicks" Mobility   Outcome Measure  Help needed turning from your back to your side while in a flat bed without using bedrails?: None Help needed moving from lying on your back to sitting on the side of a flat bed without using bedrails?: None Help needed moving to and from a bed to a chair (including a wheelchair)?: A Little Help needed standing up from a chair using your arms (e.g., wheelchair or bedside chair)?: A Little Help needed to walk in hospital room?: A  Little Help needed climbing 3-5 steps with a railing? : A Little 6 Click Score: 20    End of Session Equipment Utilized During Treatment: Gait belt Activity Tolerance: Patient tolerated treatment well Patient left: in chair;with chair alarm set;with call bell/phone within reach Nurse Communication: Mobility status PT Visit Diagnosis: Muscle weakness (generalized) (M62.81);Difficulty in walking, not elsewhere classified (R26.2)     Time: 7619-5093 PT Time Calculation (min) (ACUTE ONLY): 24 min  Charges:  $Gait Training: 8-22 mins $Therapeutic Activity: 8-22 mins                     Louie Casa, SPT Acute Rehab: [phone]    Domingo Dimes 12/22/2020, 10:28 AM

## 2020-12-23 DIAGNOSIS — R1310 Dysphagia, unspecified: Secondary | ICD-10-CM

## 2020-12-23 DIAGNOSIS — R1314 Dysphagia, pharyngoesophageal phase: Secondary | ICD-10-CM | POA: Diagnosis present

## 2020-12-23 LAB — GLUCOSE, CAPILLARY
Glucose-Capillary: 94 mg/dL (ref 70–99)
Glucose-Capillary: 89 mg/dL (ref 70–99)
Glucose-Capillary: 89 mg/dL (ref 70–99)
Glucose-Capillary: 101 mg/dL — ABNORMAL HIGH (ref 70–99)
Glucose-Capillary: 126 mg/dL — ABNORMAL HIGH (ref 70–99)
Glucose-Capillary: 122 mg/dL — ABNORMAL HIGH (ref 70–99)

## 2020-12-23 LAB — COMPREHENSIVE METABOLIC PANEL
ALT: 29 U/L (ref 0–44)
AST: 42 U/L — ABNORMAL HIGH (ref 15–41)
Albumin: 2.7 g/dL — ABNORMAL LOW (ref 3.5–5.0)
Alkaline Phosphatase: 41 U/L (ref 38–126)
Anion gap: 11 (ref 5–15)
BUN: 16 mg/dL (ref 8–23)
CO2: 28 mmol/L (ref 22–32)
Calcium: 9.8 mg/dL (ref 8.9–10.3)
Chloride: 108 mmol/L (ref 98–111)
Creatinine, Ser: 1.34 mg/dL — ABNORMAL HIGH (ref 0.44–1.00)
GFR, Estimated: 40 mL/min — ABNORMAL LOW (ref >60)
Glucose, Bld: 94 mg/dL (ref 70–99)
Potassium: 4.1 mmol/L (ref 3.5–5.1)
Sodium: 147 mmol/L — ABNORMAL HIGH (ref 135–145)
Total Bilirubin: 0.2 mg/dL — ABNORMAL LOW (ref 0.3–1.2)
Total Protein: 6.4 g/dL — ABNORMAL LOW (ref 6.5–8.1)

## 2020-12-23 LAB — CBC
HCT: 32.2 % — ABNORMAL LOW (ref 36.0–46.0)
Hemoglobin: 9.9 g/dL — ABNORMAL LOW (ref 12.0–15.0)
MCH: 27.4 pg (ref 26.0–34.0)
MCHC: 30.7 g/dL (ref 30.0–36.0)
MCV: 89.2 fL (ref 80.0–100.0)
Platelets: 254 10*3/uL (ref 150–400)
RBC: 3.61 MIL/uL — ABNORMAL LOW (ref 3.87–5.11)
RDW: 13.3 % (ref 11.5–15.5)
WBC: 9.2 10*3/uL (ref 4.0–10.5)
nRBC: 0 % (ref 0.0–0.2)

## 2020-12-23 MED ORDER — AMOXICILLIN-POT CLAVULANATE 500-125 MG PO TABS
1.0000 | ORAL_TABLET | Freq: Two times a day (BID) | ORAL | Status: DC
  Administered 2020-12-23 – 2020-12-25 (×5): 500 mg
  Filled 2020-12-23 (×6): qty 1

## 2020-12-23 MED ORDER — AMOXICILLIN-POT CLAVULANATE 875-125 MG PO TABS: 1.0000 | ORAL_TABLET | Freq: Two times a day (BID) | ORAL | Status: DC

## 2020-12-23 MED ORDER — AMOXICILLIN-POT CLAVULANATE 500-125 MG PO TABS
1.0000 | ORAL_TABLET | Freq: Two times a day (BID) | ORAL | Status: DC
  Filled 2020-12-23: qty 1

## 2020-12-23 MED ORDER — FREE WATER
100.0000 mL | Freq: Four times a day (QID) | Status: DC
  Administered 2020-12-23 – 2020-12-25 (×7): 100 mL

## 2020-12-23 MED ORDER — AMLODIPINE BESYLATE 5 MG PO TABS
5.0000 mg | ORAL_TABLET | Freq: Every day | ORAL | Status: DC
  Administered 2020-12-23 – 2020-12-25 (×3): 5 mg
  Filled 2020-12-23 (×3): qty 1

## 2020-12-23 MED ORDER — SENNA 8.6 MG PO TABS
1.0000 | ORAL_TABLET | Freq: Every day | ORAL | Status: DC
  Administered 2020-12-23 – 2020-12-25 (×3): 8.6 mg via ORAL
  Filled 2020-12-23 (×3): qty 1

## 2020-12-23 MED ORDER — POLYETHYLENE GLYCOL 3350 17 G PO PACK
17.0000 g | PACK | Freq: Every day | ORAL | Status: DC
  Administered 2020-12-23 – 2020-12-25 (×3): 17 g
  Filled 2020-12-23 (×3): qty 1

## 2020-12-23 MED ORDER — METOPROLOL TARTRATE 25 MG PO TABS
25.0000 mg | ORAL_TABLET | Freq: Two times a day (BID) | ORAL | Status: DC
  Administered 2020-12-23 – 2020-12-25 (×4): 25 mg
  Filled 2020-12-23 (×4): qty 1

## 2020-12-23 NOTE — Progress Notes (Signed)
Physical Therapy Treatment Patient Details Name: [name] [name] MRN: [id number] DOB: Oct 04, 1940 Today's Date: 12/23/2020    History of Present Illness Pt is an 80 y.o. female presenting to ED 8/2 with shortness of breath. Admitted for acute hypoxic respiratory failure likely 2/2 suspected aspiration pneumonia and severe sepsis. Past medical history significant for COPD (not on oxygen at baseline), hypertension, hyperlipidemia & PVD.    PT Comments    Pt was a little groggy and admittedly weak and fatigued, having "not eaten in days".  Emphasis on transitions, sit to stand and progression of gait stability/stamina.    Follow Up Recommendations  SNF     Equipment Recommendations       Recommendations for Other Services       Precautions / Restrictions Precautions Precautions: Fall Precaution Comments: monitor O2, Cortrak    Mobility  Bed Mobility Overal bed mobility: Needs Assistance Bed Mobility: Supine to Sit     Supine to sit: Supervision     General bed mobility comments: PEG has not significantly hindered pt's ability to come to EOB    Transfers Overall transfer level: Needs assistance   Transfers: Sit to/from Stand Sit to Stand: Min assist         General transfer comment: cues for hand placement, stability/guard assist  Ambulation/Gait Ambulation/Gait assistance: Min assist Gait Distance (Feet): 75 Feet Assistive device: Rolling walker (2 wheeled) Gait Pattern/deviations: Step-through pattern Gait velocity: decreased Gait velocity interpretation: <1.8 ft/sec, indicate of risk for recurrent falls General Gait Details: pt was generally steady overall, but relayed that she was having difficulty breathing.  Sats were at 91-92% on RA and Hr was in the 90's.  O2 reapplied once back in the room.   Stairs             Wheelchair Mobility    Modified Rankin (Stroke Patients Only)       Balance Overall balance assessment: Needs  assistance Sitting-balance support: No upper extremity supported;Feet supported Sitting balance-Leahy Scale: Good     Standing balance support: Single extremity supported;No upper extremity supported;During functional activity Standing balance-Leahy Scale: Fair                              Cognition Arousal/Alertness: Awake/alert Behavior During Therapy: WFL for tasks assessed/performed Overall Cognitive Status: Within Functional Limits for tasks assessed                                        Exercises Other Exercises Other Exercises: warm up ROM to B LE prior to mobility    General Comments        Pertinent Vitals/Pain Pain Assessment: Faces Faces Pain Scale: No hurt Pain Intervention(s): Monitored during session    Home Living                      Prior Function            PT Goals (current goals can now be found in the care plan section) Acute Rehab PT Goals Patient Stated Goal: keep moving PT Goal Formulation: With patient Time For Goal Achievement: 12/30/20 Potential to Achieve Goals: Good Progress towards PT goals: Progressing toward goals    Frequency    Min 2X/week      PT Plan Current plan remains appropriate    Co-evaluation  AM-PAC PT "6 Clicks" Mobility   Outcome Measure  Help needed turning from your back to your side while in a flat bed without using bedrails?: A Little Help needed moving from lying on your back to sitting on the side of a flat bed without using bedrails?: A Little Help needed moving to and from a bed to a chair (including a wheelchair)?: A Little Help needed standing up from a chair using your arms (e.g., wheelchair or bedside chair)?: A Little Help needed to walk in hospital room?: A Little Help needed climbing 3-5 steps with a railing? : A Little 6 Click Score: 18    End of Session   Activity Tolerance: Patient tolerated treatment well Patient left: in  chair;with chair alarm set;with call bell/phone within reach Nurse Communication: Mobility status PT Visit Diagnosis: Muscle weakness (generalized) (M62.81);Other abnormalities of gait and mobility (R26.89)     Time: 7034-0352 PT Time Calculation (min) (ACUTE ONLY): 21 min  Charges:  $Gait Training: 8-22 mins                     12/23/2020  Ginger Carne., PT Acute Rehabilitation Services [phone]  (pager) [phone]  (office)   Tessie Fass Corbyn Wildey 12/23/2020, 3:34 PM

## 2020-12-23 NOTE — Progress Notes (Signed)
FPTS Interim Night Progress Note  S:Patient sleeping comfortably.  Rounded with primary night RN.  100 cc output from G-Tube.  Tolerating meds through [facility] track.  No concerns voiced.  No orders required.    O: Today's Vitals   12/22/20 2158 12/22/20 2300 12/22/20 2327 12/23/20 0300  BP: 129/61  (!) 151/67 (!) 154/60  Pulse: 80 79 77 93  Resp:  17 15 20   Temp:   98.2 F (36.8 C) 97.7 F (36.5 C)  TempSrc:   Oral Oral  SpO2:  98% 99% 93%  Weight:      Height:      PainSc:  0-No pain  0-No pain      A/P: Continue current management Likely will restart tube feeding in am when cleared by IR  Carollee Leitz MD PGY-3, [facility] Medicine Service pager [phone]

## 2020-12-23 NOTE — Progress Notes (Signed)
[name] [name] is a 80 y.o. female s/p image guided gastrostomy tube placement with Dr. Earleen Newport yesterday.   G tube not on LWS. G tube site with minimal, dry blood under the bumper noted. Site otherwise clean, dry, no sign of acute infection.  Patient denies abdominal pain, nausea, vomiting.   Ok to use the gastrostomy tube.   Please call IR for questions and concerns regarding the G tube.    Armando Gang Krisi Azua PA-C 12/23/2020 9:43 AM

## 2020-12-23 NOTE — Progress Notes (Signed)
Family Medicine Teaching Service Daily Progress Note Intern Pager: [phone]  Patient name: [name] [name] Medical record number: [id number] Date of birth: 07-17-1940 Age: 80 y.o. Gender: female  Primary Care Provider: Maryella Shivers, MD Consultants: Palliative medicine, IR Code Status: DNR  Pt Overview and Major Events to Date:  8/2 - admitted 8/9 - PEG tube placement  Assessment and Plan: [name] [name] is an 80 year old female with past medical history of COPD (not on oxygen at baseline) HTN, HLD, PVD who presented with shortness of breath on 8/2  Severe sepsis  acute hypoxic respiratory failure 2/2 pneumonia, unknown organism, possibly 2/2 aspiration pneumonitis Continues to remain afebrile and hemodynamically stable. -Transition to oral Augmentin for total antibiotic treatment of 10 days. Today marks day 8 of total antibiotics -Discontinue IV Vancomycin and Cefepime -Keep O2 at 88 to 92% given history of COPD. Discontinue O2 -Discuss SNF placement with SW  Dysphagia  severe malnutrition Feedings to be restarted by IR.  At this time patient will receive full feedings and medications via PEG tube. Pt noted to be on Remeron on/off in the past. Generally requires tapering but as patient was not receiving daily and low dose, appropriate to discontinue. -Discontinue Remeron  Hypertension Patient has been hypertensive in the 150s. -Continue amlodipine 5 mg and metoprolol 12.5 mg twice daily  AKI, superimposed on CKD 3B (CKD 4 on admission) Creatinine 1.34 today.  AKI likely resolved at this time.  No further management required.  Hematoma Resolved at this time.  No further management required  Constipation -Start Miralax 17g and Senna 1 tablet daily  FEN/GI: Feeds via PEG tube (Osmolite 55 mL/h) PPx: Lovenox subcu 30 mg daily Dispo:SNF . Barriers include placement.   Subjective:  Patient is doing well states she does not have any pain and the procedure went well for  her.  She did not have any concerns.  Patient was excited to hear her tube feeds would restart that she has been very hungry.  Objective: Temp:  [97.7 F (36.5 C)-98.4 F (36.9 C)] 97.7 F (36.5 C) (08/10 0300) Pulse Rate:  [77-107] 93 (08/10 0300) Resp:  [14-23] 20 (08/10 0300) BP: (92-215)/(60-92) 154/60 (08/10 0300) SpO2:  [93 %-100 %] 93 % (08/10 0300) Weight:  [43.7 kg] 43.7 kg (08/10 0507) Physical Exam: General: Awake, alert, frail, no acute distress Cardiovascular: RRR, no murmurs auscultated Respiratory: Normal respiratory effort, no crackles or wheezing Abdomen: Soft, Chelle abdomen, nontender Extremities: 2+ radial pulses bilaterally, cachectic extremities  Laboratory: Recent Labs  Lab 12/21/20 0719 12/22/20 0518 12/23/20 0120  WBC 7.5 8.1 9.2  HGB 8.8* 8.9* 9.9*  HCT 28.2* 27.7* 32.2*  PLT 277 296 254   Recent Labs  Lab 12/21/20 0719 12/22/20 0518 12/23/20 0120  NA 148* 144 147*  K 3.6 4.0 4.1  CL 116* 112* 108  CO2 27 27 28   BUN 19 18 16   CREATININE 1.31* 1.26* 1.34*  CALCIUM 9.5 9.3 9.8  PROT 5.9* 5.6* 6.4*  BILITOT 0.3 0.5 0.2*  ALKPHOS 39 39 41  ALT 16 18 29   AST 18 24 42*  GLUCOSE 110* 99 94   Imaging/Diagnostic Tests: Noted imaging at this time.  Wells Guiles, DO 12/23/2020, 6:16 AM PGY-1, [facility] Intern pager: [phone], text pages welcome

## 2020-12-23 NOTE — NC FL2 (Signed)
[place] LEVEL OF CARE SCREENING TOOL     IDENTIFICATION  Patient Name: [name] [name] Birthdate: 08-Feb-1941 Sex: female Admission Date (Current Location): 12/15/2020  [facility] and [place] Number:  Herbalist and Address:  The . [facility], [place] [address] [place], [place] [address]      Provider Number: [id number]  Attending Physician Name and Address:  Dickie La, MD  Relative Name and Phone Number:  Forde Radon (Daughter)   [phone]    Current Level of Care: Hospital Recommended Level of Care: [place] Prior Approval Number:    Date Approved/Denied:   PASRR Number:    Discharge Plan: SNF    Current Diagnoses: Patient Active Problem List   Diagnosis Date Noted   Dysphagia    Hypoxia    Sepsis ([place])    Protein-calorie malnutrition, severe 12/16/2020   Aspiration pneumonia ([place]) 12/15/2020   Weight loss, non-intentional 12/15/2020   Cachexia ([place]) 12/15/2020   DOE (dyspnea on exertion) 12/15/2020   Carotid artery disease ([place]) 08/13/2019   PVD (peripheral vascular disease) ([place])    HTN (hypertension)    ATHEROSLERO NATV ART EXTREM W/INTERMIT CLAUDICAT 12/22/2009    Orientation RESPIRATION BLADDER Height & Weight     Self, Time, Situation, Place  Normal Continent, External catheter Weight: 96 lb 5.5 oz (43.7 kg) Height:  5\' 4"  (162.6 cm)  BEHAVIORAL SYMPTOMS/MOOD NEUROLOGICAL BOWEL NUTRITION STATUS      Continent Diet (See DC Summary)  AMBULATORY STATUS COMMUNICATION OF NEEDS Skin   Limited Assist Verbally Normal                       Personal Care Assistance Level of Assistance  Bathing, Dressing, Feeding Bathing Assistance: Limited assistance Feeding assistance: Independent Dressing Assistance: Limited assistance     Functional Limitations Info  Sight, Hearing, Speech Sight Info: Adequate Hearing Info: Adequate Speech Info: Adequate    SPECIAL CARE FACTORS FREQUENCY  PT (By  licensed PT), OT (By licensed OT)     PT Frequency: 5x a week OT Frequency: 5x a week            Contractures Contractures Info: Not present    Additional Factors Info  Code Status, Allergies Code Status Info: DNR Allergies Info: NKA           Current Medications (12/23/2020):  This is the current hospital active medication list Current Facility-Administered Medications  Medication Dose Route Frequency Provider Last Rate Last Admin   acetaminophen (TYLENOL) 160 MG/5ML solution 650 mg  650 mg Per Tube Q6H PRN Dickie La, MD   650 mg at 12/20/20 0654   Or   acetaminophen (TYLENOL) suppository 650 mg  650 mg Rectal Q6H PRN Dickie La, MD       albuterol (PROVENTIL) (2.5 MG/3ML) 0.083% nebulizer solution 2.5 mg  2.5 mg Nebulization Q4H PRN Dickie La, MD       amLODipine (NORVASC) tablet 5 mg  5 mg Per Tube Daily Dickie La, MD   5 mg at 12/23/20 0907   amoxicillin-clavulanate (AUGMENTIN) 500-125 MG per tablet 500 mg  1 tablet Per Tube BID Dickie La, MD   500 mg at 12/23/20 1129   dextrose 5 %-0.45 % sodium chloride infusion   Intravenous Continuous Espinoza, Alejandra, DO 50 mL/hr at 12/23/20 0056 New Bag at 12/23/20 0056   enoxaparin (LOVENOX) injection 30 mg  30 mg Subcutaneous Daily Monia Sabal, PA-C  30 mg at 12/23/20 0908   feeding supplement (OSMOLITE 1.2 CAL) liquid 1,000 mL  1,000 mL Per Tube Continuous Dickie La, MD 55 mL/hr at 12/23/20 1513 1,000 mL at 12/23/20 1513   feeding supplement (PROSource TF) liquid 45 mL  45 mL Per Tube Daily Dickie La, MD   45 mL at 12/23/20 3235   free water 100 mL  100 mL Per Tube Q6H Dickie La, MD   100 mL at 12/23/20 1513   iohexol (OMNIPAQUE) 300 MG/ML solution 50 mL  50 mL Intravenous Once PRN Arne Cleveland, MD       levothyroxine (SYNTHROID) tablet 25 mcg  25 mcg Per Tube Daily Simmons-Robinson, Makiera, MD   25 mcg at 12/23/20 0610   metoprolol tartrate (LOPRESSOR) tablet 25 mg  25 mg Per Tube BID  Simmons-Robinson, Makiera, MD       neomycin-bacitracin-polymyxin (NEOSPORIN) ointment packet 1 application  1 application Topical Daily Corrie Mckusick, DO   1 application at 57/32/20 0905   phenol (CHLORASEPTIC) mouth spray 1 spray  1 spray Mouth/Throat PRN Eppie Gibson, MD   1 spray at 12/17/20 0401   polyethylene glycol (MIRALAX / GLYCOLAX) packet 17 g  17 g Per Tube Daily Simmons-Robinson, Makiera, MD   17 g at 12/23/20 1129   senna (SENOKOT) tablet 8.6 mg  1 tablet Oral Daily Simmons-Robinson, Makiera, MD   8.6 mg at 12/23/20 1129   umeclidinium bromide (INCRUSE ELLIPTA) 62.5 MCG/INH 1 puff  1 puff Inhalation Daily Dickie La, MD   1 puff at 12/23/20 1105     Discharge Medications: Please see discharge summary for a list of discharge medications.  Relevant Imaging Results:  Relevant Lab Results:   Additional Information SSN: [id number];  Pt has covid vaccinations plus one booster  Reece Agar, LCSWA

## 2020-12-23 NOTE — TOC Initial Note (Signed)
Transition of Care [facility]) - Initial/Assessment Note    Patient Details  Name: [name] [name] MRN: [id number] Date of Birth: [birth year]  Transition of [facility]) CM/SW Contact:    Tresa Endo Phone Number: 12/23/2020, 4:41 PM  Clinical Narrative:                 CSW received SNF consult. CSW met with pt and her daughter at bedside. CSW introduced self and explained role at the hospital. Pt reports that PTA the pt lived at home with daughter. PT reports pt needs minimal assistance and used a 2 wheeled walker. Pt walked 75 feet but has difficulty breathing. PT reports pt has been weak and fatigued and has not eaten in days.  CSW reviewed PT/OT recommendations for SNF. Pt reports she is agreeable to a SNF.  Pt gave CSW permission to fax out to facilities in the area. Pt daughter suggested [facility] and [employer]. CSW gave pt medicare.gov rating list to review. PT reports they are covid negative and has covid vaccinations with one booster.  CSW will continue to follow.    Expected Discharge Plan: Skilled Nursing Facility Barriers to Discharge: Continued Medical Work up   Patient Goals and CMS Choice Patient states their goals for this hospitalization and ongoing recovery are:: Rehab CMS Medicare.gov Compare Post Acute Care list provided to:: Patient Choice offered to / list presented to : Patient, Adult Children  Expected Discharge Plan and Services Expected Discharge Plan: [place] In-house Referral: Clinical Social Work Discharge Planning Services: NA Post Acute Care Choice: [place] Living arrangements for the past 2 months: [place]                 DME Arranged: 3-N-1 DME Agency: [employer] Date DME Agency Contacted: 12/16/20 Time DME Agency Contacted: [phone] Representative spoke with at DME Agency: Adela Lank HH Arranged: RN, PT, OT [place] Agency: [place] Date [place]:  12/16/20 Time HH Agency Contacted: 47 Representative spoke with at [place]: [place] Arrangements/Services Living arrangements for the past 2 months: [place] Lives with:: Self, Adult Children Patient language and need for interpreter reviewed:: Yes Do you feel safe going back to the place where you live?: Yes      Need for Family Participation in Patient Care: Yes (Comment) Care giver support system in place?: Yes (comment) Current home services: DME (walker and incentive spirometry) Criminal Activity/Legal Involvement Pertinent to Current Situation/Hospitalization: No - Comment as needed  Activities of Daily Living Home Assistive Devices/Equipment: Environmental consultant (specify type) ADL Screening (condition at time of admission) Patient's cognitive ability adequate to safely complete daily activities?: Yes Is the patient deaf or have difficulty hearing?: No Does the patient have difficulty seeing, even when wearing glasses/contacts?: No Does the patient have difficulty concentrating, remembering, or making decisions?: No Patient able to express need for assistance with ADLs?: Yes Does the patient have difficulty dressing or bathing?: No Independently performs ADLs?: No Communication: Appropriate for developmental age Dressing (OT): Appropriate for developmental age Grooming: Appropriate for developmental age Feeding: Appropriate for developmental age Bathing: Appropriate for developmental age 25: Appropriate for developmental age In/Out Bed: Appropriate for developmental age 67 in Home: Appropriate for developmental age Does the patient have difficulty walking or climbing stairs?: No Weakness of Legs: Both Weakness of Arms/Hands: Both  Permission Sought/Granted Permission sought to share information with : Family Supports, Chartered certified accountant granted to share information with :  Yes, Verbal Permission Granted  Share  Information with NAME: Forde Radon (Daughter)   [phone]  Permission granted to share info w AGENCY: SNF  Permission granted to share info w Relationship: WATSON,[name] (Daughter)   [phone]  Permission granted to share info w Contact Information: Forde Radon (Daughter)   [phone]  Emotional Assessment Appearance:: Appears stated age Attitude/Demeanor/Rapport: Engaged Affect (typically observed): Appropriate Orientation: : Oriented to Self, Oriented to Place, Oriented to  Time, Oriented to Situation Alcohol / Substance Use: Not Applicable Psych Involvement: No (comment)  Admission diagnosis:  Aspiration pneumonia ([place]) [J69.0] Hypoxia [R09.02] Chronic pneumonia [J18.9] Sepsis, due to unspecified organism, unspecified whether acute organ dysfunction present [facility]) [A41.9] Patient Active Problem List   Diagnosis Date Noted   Dysphagia    Hypoxia    Sepsis ([place])    Protein-calorie malnutrition, severe 12/16/2020   Aspiration pneumonia ([place]) 12/15/2020   Weight loss, non-intentional 12/15/2020   Cachexia ([place]) 12/15/2020   DOE (dyspnea on exertion) 12/15/2020   Carotid artery disease ([place]) 08/13/2019   PVD (peripheral vascular disease) ([place])    HTN (hypertension)    ATHEROSLERO NATV ART EXTREM W/INTERMIT CLAUDICAT 12/22/2009   PCP:  Maryella Shivers, MD Pharmacy:   [facility] [place] [place], [address] [place] [address] Phone: [phone] Fax: [phone]     Social Determinants of Health (SDOH) Interventions    Readmission Risk Interventions No flowsheet data found.

## 2020-12-23 NOTE — Progress Notes (Signed)
Nutrition Follow-up  DOCUMENTATION CODES:   Severe malnutrition in context of chronic illness, Underweight  INTERVENTION:   - Recommend d/c Cortrak now that PEG tube has been cleared for use by IR  Resume tube feeds at goal rate via PEG: - Osmolite 1.2 @ 55 ml/hr (1320 ml/day) - ProSource TF 45 ml once daily - Add free water flushes of 100 ml q 6 hours  Tube feeding regimen provides 1624 kcal, 84 grams of protein, and 1082 ml of H2O.   Total free water with flushes: 1482 ml  - Recommend bowel regimen as pt has not had a documented BM since admission  NUTRITION DIAGNOSIS:   Severe Malnutrition related to chronic illness (COPD) as evidenced by percent weight loss, severe fat depletion, severe muscle depletion.  Ongoing, being addressed via TF  GOAL:   Patient will meet greater than or equal to 90% of their needs  Met via TF  MONITOR:   Diet advancement, PO intake, Supplement acceptance, Labs, Weight trends, I & O's  REASON FOR ASSESSMENT:   Malnutrition Screening Tool    ASSESSMENT:   80 yo female with a PMH of COPD (not on O2 at baseline), HTN, HLD, and PVD who presents with aspiration PNA. Daughter at bedside and reports that patient was admitted July 2-4th for PNA. She states that the patient has had decreased PO intake since then and has progressively become weaker over the last few weeks.  8/04 - s/p MBS with revealed severe pharyngeal dysphagia with minimal entrance in the cervical esophagus likely a result of radiation treatments 8/05 - Cortrak placed (tip gastric), TF initiated 8/09 - s/p G-tube by IR  Spoke with pt at bedside. Pt sitting in chair at time of RD visit. Pt reports feeling very hungry and is eager to have her TF restarted. Pt's G-tube has been cleared for use by IR. Pt remains NPO.  Discussed restarting TF with RN and MD. MD to place order to resume tube feeds.  Discussed adding bowel regimen with MD. Miralax and senna added. RD will also add  free water flushes. Also discussed discontinuing remeron with MD as pt is NPO; medication was discontinued.  Admit weight: 44.3 kg Current weight: 43.7 kg  Medications reviewed and include: remeron 15 mg, IV abx IVF: D5 in 1/2NS @ 50 ml/hr  Labs reviewed: sodium 147, creatinine 1.34, hemoglobin 9.9 CBG's: 82-100 x 24 hours  UOP: 350 ml x 24 hours I/O's: +7.3 L since admit  Diet Order:   Diet Order             Diet NPO time specified Except for: Sips with Meds  Diet effective midnight                   EDUCATION NEEDS:   Education needs have been addressed  Skin:  Skin Assessment: Reviewed RN Assessment  Last BM:  no documented BM  Height:   Ht Readings from Last 1 Encounters:  12/15/20 5' 4"  (1.626 m)    Weight:   Wt Readings from Last 1 Encounters:  12/23/20 43.7 kg    BMI:  Body mass index is 16.54 kg/m.  Estimated Nutritional Needs:   Kcal:  1500-1700 kcal  Protein:  75-90 grams  Fluid:  >/= 1.5 L/day    Gustavus Bryant, MS, RD, LDN Inpatient Clinical Dietitian Please see AMiON for contact information.

## 2020-12-23 NOTE — Plan of Care (Signed)
  Problem: Education: Goal: Knowledge of General Education information will improve Description: Including pain rating scale, medication(s)/side effects and non-pharmacologic comfort measures Outcome: Progressing   Problem: Health Behavior/Discharge Planning: Goal: Ability to manage health-related needs will improve Outcome: Progressing   Problem: Clinical Measurements: Goal: Ability to maintain clinical measurements within normal limits will improve Outcome: Progressing Goal: Will remain free from infection Outcome: Progressing Goal: Diagnostic test results will improve Outcome: Progressing Goal: Respiratory complications will improve Outcome: Progressing Goal: Cardiovascular complication will be avoided Outcome: Progressing   Problem: Activity: Goal: Risk for activity intolerance will decrease Outcome: Progressing   Problem: Coping: Goal: Level of anxiety will decrease Outcome: Progressing   Problem: Elimination: Goal: Will not experience complications related to bowel motility Outcome: Progressing Goal: Will not experience complications related to urinary retention Outcome: Progressing   Problem: Pain Managment: Goal: General experience of comfort will improve Outcome: Progressing   Problem: Safety: Goal: Ability to remain free from injury will improve Outcome: Progressing   

## 2020-12-23 NOTE — Progress Notes (Signed)
  Speech Language Pathology Treatment: Dysphagia  Patient Details Name: [name] [name] MRN: [id number] DOB: September 11, 1940 Today's Date: 12/23/2020 Time: 5974-1638 SLP Time Calculation (min) (ACUTE ONLY): 14 min  Assessment / Plan / Recommendation Clinical Impression  Pt has been consuming ice chips, but upon observation, what she is doing is letting them melt in her mouth and then using the yankauer to remove the melted water. She says that she has not been swallowing them because she thought she "wasn't supposed to." Educated pt on the potential benefits to secretion management if she tries to swallow at least 1-2 ice chips after oral care, when her mouth is at its cleanest. She tried to swallow only one before she resumed using the yankauer, but this single ice chip did result in coughing and expectoration of a small amount of thick secretions. Would continue current diet and plan, with primary nutrition to be received via alternative means as outline in [place].    HPI HPI: Pt is an 80 yo female presenting with SOB. Pt found to have severe sepsis from suspected aspiration PNA. She was recently admitted to another hospital 7/2-7/4 for PNA and per family report has had increased phlegm production and decreased PO intake due to N/V since that time. PMH also includes: throat ca s/p XRT and surgery, COPD, exertional dyspnea, HLD, HTN, hypothyroidism, PVD      SLP Plan  Continue with current plan of care       Recommendations  Diet recommendations: NPO (few single pieces of ice after oral care) Medication Administration: Via alternative means                Oral Care Recommendations: Oral care QID Follow up Recommendations: Skilled Nursing facility SLP Visit Diagnosis: Dysphagia, pharyngoesophageal phase (R13.14) Plan: Continue with current plan of care       GO                Osie Bond., M.A. [place] [facility] Pager [phone] Office  [phone]  12/23/2020, 3:45 PM

## 2020-12-24 ENCOUNTER — Encounter (HOSPITAL_COMMUNITY): Payer: Self-pay | Admitting: Family Medicine

## 2020-12-24 DIAGNOSIS — R1314 Dysphagia, pharyngoesophageal phase: Secondary | ICD-10-CM

## 2020-12-24 DIAGNOSIS — J439 Emphysema, unspecified: Secondary | ICD-10-CM | POA: Diagnosis present

## 2020-12-24 DIAGNOSIS — R54 Age-related physical debility: Secondary | ICD-10-CM

## 2020-12-24 DIAGNOSIS — J432 Centrilobular emphysema: Secondary | ICD-10-CM

## 2020-12-24 DIAGNOSIS — R636 Underweight: Secondary | ICD-10-CM | POA: Diagnosis present

## 2020-12-24 DIAGNOSIS — Z931 Gastrostomy status: Secondary | ICD-10-CM

## 2020-12-24 HISTORY — DX: Gastrostomy status: Z93.1

## 2020-12-24 HISTORY — DX: Age-related physical debility: R54

## 2020-12-24 LAB — GLUCOSE, CAPILLARY
Glucose-Capillary: 121 mg/dL — ABNORMAL HIGH (ref 70–99)
Glucose-Capillary: 127 mg/dL — ABNORMAL HIGH (ref 70–99)
Glucose-Capillary: 94 mg/dL (ref 70–99)
Glucose-Capillary: 102 mg/dL — ABNORMAL HIGH (ref 70–99)
Glucose-Capillary: 135 mg/dL — ABNORMAL HIGH (ref 70–99)

## 2020-12-24 LAB — SARS CORONAVIRUS 2 (TAT 6-24 HRS): SARS Coronavirus 2: NEGATIVE

## 2020-12-24 NOTE — Progress Notes (Signed)
NG tube discontinued. Procedure explained to patient. NG pulled. No complications. Patient tolerated well.

## 2020-12-24 NOTE — Plan of Care (Signed)
  Problem: Education: Goal: Knowledge of General Education information will improve Description: Including pain rating scale, medication(s)/side effects and non-pharmacologic comfort measures Outcome: Progressing   Problem: Health Behavior/Discharge Planning: Goal: Ability to manage health-related needs will improve Outcome: Progressing   Problem: Clinical Measurements: Goal: Respiratory complications will improve Outcome: Progressing   Problem: Nutrition: Goal: Adequate nutrition will be maintained Outcome: Progressing   Problem: Pain Managment: Goal: General experience of comfort will improve Outcome: Progressing   Problem: Skin Integrity: Goal: Risk for impaired skin integrity will decrease Outcome: Progressing   

## 2020-12-24 NOTE — Progress Notes (Signed)
Family Medicine Teaching Service Daily Progress Note Intern Pager: [phone]  Patient name: [name] [name] Medical record number: [id number] Date of birth: 1940-06-29 Age: 80 y.o. Gender: female  Primary Care Provider: Maryella Shivers, MD Consultants: Palliative medicine, IR Code Status: DNR  Pt Overview and Major Events to Date:  8/2: admitted 8/9: PEG tube placement  Assessment and Plan: [name] [name] is an 80 year old female with past medical history of COPD (not on oxygen at baseline), HTN, HLD, PVD presented with shortness of breath on 8/2.  Severe sepsis  Acute hypoxic respiratory failure 2/2 pneumonia, unknown organism, possibly 2/2 aspiration pneumonitis Continues to remain afebrile and hemodynamically stable -Continue oral Augmentin for total antibiotic treatment of 10 days.  Today marks day 9 of total antibiotics.  Last day of treatment tomorrow -Keep O2 at 88 to 92% given history of COPD -Social work to continue working on SNF placement  Dysphagia  severe malnutrition Feedings have been restarted via PEG tube at this time.  Patient notes she is still feeling hungry but glad the feedings are restarted. -Discontinue  CorTrak tube   FEN/GI: N.p.o., tube feedings PPx: Lovenox Dispo:SNF today. Barriers include social work placement.   Subjective:  Patient states she feels fine does not have any concerns or questions.  She is still feeling hungry despite the continuous feeds being restarted.  Objective: Temp:  [97.3 F (36.3 C)-98.7 F (37.1 C)] 98.2 F (36.8 C) (08/11 0341) Pulse Rate:  [76-102] 83 (08/11 0341) Resp:  [16-20] 18 (08/11 0341) BP: (108-186)/(55-82) 145/73 (08/11 0341) SpO2:  [91 %-100 %] 98 % (08/11 0341) Weight:  [44.4 kg] 44.4 kg (08/11 0401) Physical Exam: General: Awake, alert, frail, no acute distress Cardiovascular: RRR, no murmurs auscultated Respiratory: Normal respiratory effort, no crackles or wheezing Abdomen: Soft, nondistended,  nontender, normoactive bowel sounds Extremities: 2+ radial pulses bilaterally, cachectic extremities  Laboratory: Recent Labs  Lab 12/21/20 0719 12/22/20 0518 12/23/20 0120  WBC 7.5 8.1 9.2  HGB 8.8* 8.9* 9.9*  HCT 28.2* 27.7* 32.2*  PLT 277 296 254   Recent Labs  Lab 12/21/20 0719 12/22/20 0518 12/23/20 0120  NA 148* 144 147*  K 3.6 4.0 4.1  CL 116* 112* 108  CO2 27 27 28   BUN 19 18 16   CREATININE 1.31* 1.26* 1.34*  CALCIUM 9.5 9.3 9.8  PROT 5.9* 5.6* 6.4*  BILITOT 0.3 0.5 0.2*  ALKPHOS 39 39 41  ALT 16 18 29   AST 18 24 42*  GLUCOSE 110* 99 94   CBG (last 3)  Recent Labs    12/23/20 1932 12/23/20 2321 12/24/20 0342  GLUCAP 126* 122* 121*    Imaging/Diagnostic Tests: No new imaging or studies at this time  Wells Guiles, DO 12/24/2020, 7:32 AM PGY-1, [place] Intern pager: [phone], text pages welcome

## 2020-12-24 NOTE — Progress Notes (Signed)
FPTS Interim Night Progress Note  S:Patient sleeping comfortably.  Rounded with primary night RN.  Tolerating tube feeding.  No concerns voiced.  No orders required.    O: Today's Vitals   12/23/20 2319 12/24/20 0200 12/24/20 0341 12/24/20 0401  BP: (!) 152/69 133/60 (!) 145/73   Pulse: 76 90 83   Resp: 19 20 18    Temp: 98.3 F (36.8 C)  98.2 F (36.8 C)   TempSrc: Oral  Oral   SpO2: 94% 94% 98%   Weight:    44.4 kg  Height:      PainSc:  0-No pain        A/P: Continue current management  Carollee Leitz MD PGY-3, [place] Medicine Service pager [phone]

## 2020-12-24 NOTE — Progress Notes (Signed)
Occupational Therapy Treatment Patient Details Name: [name] [name] MRN: [id number] DOB: 06-01-1940 Today's Date: 12/24/2020    History of present illness Pt is an 80 y.o. female presenting to ED 8/2 with shortness of breath. Admitted for acute hypoxic respiratory failure likely 2/2 suspected aspiration pneumonia and severe sepsis. Past medical history significant for COPD (not on oxygen at baseline), hypertension, hyperlipidemia & PVD.   OT comments  Pt appearing weaker and more unsteady. Now with PEG tube, but Cortrak remains. Demonstrated ability to don socks with set up, transfer to [facility] with min assist and groom with set up seated on BSC. Pt with liquid BM, assisted with pericare and to manage diaper. Pt declined remaining up in chair, returned to supine. Updated d/c to SNF.   Follow Up Recommendations  SNF    Equipment Recommendations  Tub/shower seat    Recommendations for Other Services      Precautions / Restrictions Precautions Precautions: Fall Precaution Comments: PEG tube       Mobility Bed Mobility Overal bed mobility: Needs Assistance Bed Mobility: Supine to Sit;Sit to Supine     Supine to sit: Supervision Sit to supine: Supervision   General bed mobility comments: supervision for line and safety, HOB up    Transfers Overall transfer level: Needs assistance Equipment used: 1 person hand held assist Transfers: Sit to/from [facility] Sit to Stand: Min guard Stand pivot transfers: Min assist       General transfer comment: used RW for standing to take steps toward [facility], hand held assist to transfer on and off BSC    Balance Overall balance assessment: Needs assistance   Sitting balance-Leahy Scale: Good Sitting balance - Comments: no LOB with donning socks     Standing balance-Leahy Scale: Fair Standing balance comment: requires at least one hand stability in standing                           ADL either performed or  assessed with clinical judgement   ADL Overall ADL's : Needs assistance/impaired     Grooming: Wash/dry face;Sitting;Set up           Upper Body Dressing : Minimal assistance;Sitting Upper Body Dressing Details (indicate cue type and reason): assist due to lines Lower Body Dressing: Set up;Sitting/lateral leans Lower Body Dressing Details (indicate cue type and reason): for socks Toilet Transfer: Min guard;Stand-pivot;BSC   Toileting- Clothing Manipulation and Hygiene: Total assistance;Sit to/from stand Toileting - Clothing Manipulation Details (indicate cue type and reason): for posterior pericare and to manage diaper             Vision       Perception     Praxis      Cognition Arousal/Alertness: Awake/alert Behavior During Therapy: WFL for tasks assessed/performed Overall Cognitive Status: Within Functional Limits for tasks assessed                                          Exercises     Shoulder Instructions       General Comments      Pertinent Vitals/ Pain       Pain Assessment: No/denies pain  Home Living  Prior Functioning/Environment              Frequency  Min 2X/week        Progress Toward Goals  OT Goals(current goals can now be found in the care plan section)  Progress towards OT goals: Progressing toward goals  Acute Rehab OT Goals Patient Stated Goal: keep moving OT Goal Formulation: With patient Time For Goal Achievement: 12/30/20 Potential to Achieve Goals: Good  Plan Discharge plan needs to be updated    Co-evaluation                 AM-PAC OT "6 Clicks" Daily Activity     Outcome Measure   Help from another person eating meals?: None Help from another person taking care of personal grooming?: A Little Help from another person toileting, which includes using toliet, bedpan, or urinal?: Total Help from another person bathing  (including washing, rinsing, drying)?: A Lot Help from another person to put on and taking off regular upper body clothing?: A Little Help from another person to put on and taking off regular lower body clothing?: A Lot 6 Click Score: 15    End of Session Equipment Utilized During Treatment: Rolling walker  OT Visit Diagnosis: Unsteadiness on feet (R26.81);Muscle weakness (generalized) (M62.81)   Activity Tolerance Patient tolerated treatment well   Patient Left in bed;with call bell/phone within reach   Nurse Communication          Time: 8329-1916 OT Time Calculation (min): 24 min  Charges: OT General Charges $OT Visit: 1 Visit OT Treatments $Self Care/Home Management : 23-37 mins  Nestor Lewandowsky, OTR/L Acute Rehabilitation Services Pager: [phone] Office: [phone]    Malka So 12/24/2020, 9:01 AM

## 2020-12-24 NOTE — TOC Progression Note (Addendum)
Transition of Care [facility]) - Progression Note    Patient Details  Name: [name] [name] MRN: [id number] Date of Birth: [birth year]  Transition of Care [facility]) CM/SW Contact  Reece Agar, [place] Phone Number: 12/24/2020, 2:40 PM  Clinical Narrative:    CSW spoke with pt daughter who provided CSW with COVID vaccination information. Pt has the [place] with 2 vax. And 1 booster. CSW reached out to [facility] who will take pt tomorrow after auth and covid is back.  Pt PASSAR: [phone] [id number]   Expected Discharge Plan: [place] Barriers to Discharge: Continued Medical Work up  Expected Discharge Plan and Services Expected Discharge Plan: [place] In-house Referral: Clinical Social Work Discharge Planning Services: NA Post Acute Care Choice: [place] Living arrangements for the past 2 months: [place]                 DME Arranged: 3-N-1 DME Agency: [employer] Date DME Agency Contacted: 12/16/20 Time DME Agency Contacted: [phone] Representative spoke with at DME Agency: [place] Arranged: RN, PT, OT [place] Agency: [place] Date [place]: 12/16/20 Time [place]: 8832 Representative spoke with at [place]: [place] ([place]) Interventions    Readmission Risk Interventions No flowsheet data found.

## 2020-12-24 NOTE — TOC Progression Note (Signed)
Transition of Care [facility]) - Progression Note    Patient Details  Name: [name] [name] MRN: [id number] Date of Birth: 13-Sep-1940  Transition of [facility]) CM/SW [place], [place] Phone Number: 12/24/2020, 2:49 PM  Clinical Narrative:     CSW started insurance authorization for patient. Reference number is # [id number]. Patient has SNF bed at [place]. Insurance authorization is pending. CSW will continue to follow and assist with dc planning needs.  Expected Discharge Plan: Skilled Nursing Facility Barriers to Discharge: Continued Medical Work up  Expected Discharge Plan and Services Expected Discharge Plan: [place] In-house Referral: Clinical Social Work Discharge Planning Services: NA Post Acute Care Choice: [place] Living arrangements for the past 2 months: [place]                 DME Arranged: 3-N-1 DME Agency: [employer] Date DME Agency Contacted: 12/16/20 Time DME Agency Contacted: [phone] Representative spoke with at DME Agency: [place] Arranged: RN, PT, OT [place] Agency: [place] Date [place]: 12/16/20 Time [place]: [id number] Representative spoke with at [place]: [place] ([place]) Interventions    Readmission Risk Interventions No flowsheet data found.

## 2020-12-25 DIAGNOSIS — M6259 Muscle wasting and atrophy, not elsewhere classified, multiple sites: Secondary | ICD-10-CM | POA: Diagnosis not present

## 2020-12-25 DIAGNOSIS — I129 Hypertensive chronic kidney disease with stage 1 through stage 4 chronic kidney disease, or unspecified chronic kidney disease: Secondary | ICD-10-CM | POA: Diagnosis not present

## 2020-12-25 DIAGNOSIS — M6281 Muscle weakness (generalized): Secondary | ICD-10-CM | POA: Diagnosis not present

## 2020-12-25 DIAGNOSIS — I2510 Atherosclerotic heart disease of native coronary artery without angina pectoris: Secondary | ICD-10-CM | POA: Diagnosis not present

## 2020-12-25 DIAGNOSIS — A419 Sepsis, unspecified organism: Secondary | ICD-10-CM | POA: Diagnosis not present

## 2020-12-25 DIAGNOSIS — R110 Nausea: Secondary | ICD-10-CM | POA: Diagnosis not present

## 2020-12-25 DIAGNOSIS — R54 Age-related physical debility: Secondary | ICD-10-CM | POA: Diagnosis not present

## 2020-12-25 DIAGNOSIS — R64 Cachexia: Secondary | ICD-10-CM | POA: Diagnosis not present

## 2020-12-25 DIAGNOSIS — J9601 Acute respiratory failure with hypoxia: Secondary | ICD-10-CM | POA: Diagnosis not present

## 2020-12-25 DIAGNOSIS — E039 Hypothyroidism, unspecified: Secondary | ICD-10-CM | POA: Diagnosis not present

## 2020-12-25 DIAGNOSIS — R636 Underweight: Secondary | ICD-10-CM | POA: Diagnosis not present

## 2020-12-25 DIAGNOSIS — E43 Unspecified severe protein-calorie malnutrition: Secondary | ICD-10-CM | POA: Diagnosis not present

## 2020-12-25 DIAGNOSIS — I739 Peripheral vascular disease, unspecified: Secondary | ICD-10-CM | POA: Diagnosis not present

## 2020-12-25 DIAGNOSIS — R2689 Other abnormalities of gait and mobility: Secondary | ICD-10-CM | POA: Diagnosis not present

## 2020-12-25 DIAGNOSIS — J690 Pneumonitis due to inhalation of food and vomit: Secondary | ICD-10-CM | POA: Diagnosis not present

## 2020-12-25 DIAGNOSIS — R531 Weakness: Secondary | ICD-10-CM | POA: Diagnosis not present

## 2020-12-25 DIAGNOSIS — R5381 Other malaise: Secondary | ICD-10-CM | POA: Diagnosis not present

## 2020-12-25 DIAGNOSIS — Z931 Gastrostomy status: Secondary | ICD-10-CM | POA: Diagnosis not present

## 2020-12-25 DIAGNOSIS — Z743 Need for continuous supervision: Secondary | ICD-10-CM | POA: Diagnosis not present

## 2020-12-25 DIAGNOSIS — R0902 Hypoxemia: Secondary | ICD-10-CM | POA: Diagnosis not present

## 2020-12-25 DIAGNOSIS — J449 Chronic obstructive pulmonary disease, unspecified: Secondary | ICD-10-CM | POA: Diagnosis not present

## 2020-12-25 DIAGNOSIS — N184 Chronic kidney disease, stage 4 (severe): Secondary | ICD-10-CM | POA: Diagnosis not present

## 2020-12-25 DIAGNOSIS — R634 Abnormal weight loss: Secondary | ICD-10-CM | POA: Diagnosis not present

## 2020-12-25 DIAGNOSIS — R1314 Dysphagia, pharyngoesophageal phase: Secondary | ICD-10-CM | POA: Diagnosis not present

## 2020-12-25 DIAGNOSIS — R093 Abnormal sputum: Secondary | ICD-10-CM | POA: Diagnosis not present

## 2020-12-25 LAB — COMPREHENSIVE METABOLIC PANEL
ALT: 21 U/L (ref 0–44)
AST: 24 U/L (ref 15–41)
Albumin: 2.5 g/dL — ABNORMAL LOW (ref 3.5–5.0)
Alkaline Phosphatase: 40 U/L (ref 38–126)
Anion gap: 6 (ref 5–15)
BUN: 15 mg/dL (ref 8–23)
CO2: 29 mmol/L (ref 22–32)
Calcium: 9.5 mg/dL (ref 8.9–10.3)
Chloride: 104 mmol/L (ref 98–111)
Creatinine, Ser: 1.26 mg/dL — ABNORMAL HIGH (ref 0.44–1.00)
GFR, Estimated: 43 mL/min — ABNORMAL LOW (ref >60)
Glucose, Bld: 122 mg/dL — ABNORMAL HIGH (ref 70–99)
Potassium: 3.6 mmol/L (ref 3.5–5.1)
Sodium: 139 mmol/L (ref 135–145)
Total Bilirubin: 0.2 mg/dL — ABNORMAL LOW (ref 0.3–1.2)
Total Protein: 5.9 g/dL — ABNORMAL LOW (ref 6.5–8.1)

## 2020-12-25 LAB — GLUCOSE, CAPILLARY
Glucose-Capillary: 100 mg/dL — ABNORMAL HIGH (ref 70–99)
Glucose-Capillary: 109 mg/dL — ABNORMAL HIGH (ref 70–99)
Glucose-Capillary: 118 mg/dL — ABNORMAL HIGH (ref 70–99)
Glucose-Capillary: 128 mg/dL — ABNORMAL HIGH (ref 70–99)

## 2020-12-25 MED ORDER — BACITRACIN-NEOMYCIN-POLYMYXIN 400-5-5000 EX OINT: 1.0000 "application " | TOPICAL_OINTMENT | Freq: Every day | CUTANEOUS | 0 refills | Status: AC

## 2020-12-25 MED ORDER — METOPROLOL TARTRATE 25 MG PO TABS: 25.0000 mg | ORAL_TABLET | Freq: Two times a day (BID) | ORAL | Status: DC

## 2020-12-25 MED ORDER — LEVOTHYROXINE SODIUM 25 MCG PO TABS: 25.0000 ug | ORAL_TABLET | Freq: Every day | ORAL | Status: DC

## 2020-12-25 NOTE — Discharge Summary (Addendum)
[facility] Discharge Summary  Patient name: [name] [name] Medical record number: [id number] Date of birth: 07/10/1940 Age: 80 y.o. Gender: female Date of Admission: 12/15/2020  Date of Discharge: 12/25/2020 Admitting Physician: Eulis Foster, MD  Primary Care Provider: Maryella Shivers, MD Consultants: Palliative medicine, IR  Indication for Hospitalization: Shortness of breath  Discharge Diagnoses/Problem List:  Principal Problem:   Aspiration pneumonia ([place]) Active Problems:   Weight loss, non-intentional   Cachexia ([place])   Protein-calorie malnutrition, severe   Sepsis ([place])   Dysphagia, pharyngoesophageal phase   Underweight   Frailty syndrome in geriatric patient   Emphysema lung ([place])   PEG (percutaneous endoscopic gastrostomy) status ([place])   Disposition: SNF  Discharge Condition: Stable  Discharge Exam:  Blood pressure (!) 93/57, pulse 78, temperature 98.1 F (36.7 C), temperature source Oral, resp. rate 20, height _0  (1.626 m), weight 43.7 kg, SpO2 98 %.   General: Awake, alert, frail, no acute distress Cardiovascular: RRR, no murmurs auscultated Respiratory: Normal respiratory effort, no crackles or wheezing Abdomen: Soft, nondistended, nontender, normoactive bowel sounds Extremities: 2+ radial pulse bilaterally, cachectic extremities  Brief Hospital Course:  [name] [name] is a 80 y.o. female who presented with SOB. Patient was found to be hypoxic as low as 86% with CXR on admission showed mild left base atelectasis/infiltrate with mild right base subsegmental atelectasis. Chest CTA was negative for pulmonary embolism, but evident for emphysema and bilateral airspace opacities consistent with infection or aspiration.    Acute hypoxic respiratory failure 2/2 pneumonia  Severe sepsis In the ED, she met SIRS criteria and code sepsis was called (Met SIRS criteria: tachycardia (>90bpm), tachypnea (>20 resp/min) and elevated  lactic acid (>2.0). Also notably had procalcitonin of 5.97. She received NS bolus x2 for hypotension.  She was started on IV vancomycin and cefepime. MRSA swab was negative and so discontinued vancomycin. Patient was de-escalated on broad spectrum antibiotics and continued to receive IV antibiotics until PEG tube placement and then transition to oral Augmentin via PEG tube. Patient received total of 10-day treatment course of antibiotics ending on 12/25/2020.  Patient does not have any oxygen requirements on discharge.  Protein calorie malnutrition, severe Patient appeared cachectic with muscle wasting.  Swallow study revealed severe pharyngeal dysphagia, with aspiration after swallow.  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  Palliative care and RD were consulted.  Patient was ultimately determined to require PEG tube for feeding.  PEG tube was placed and feedings started. Mirtazepine was discontinued as patient would regularly receive feeds and this was prescribed to increase appetite.  PCP Follow-up recommendations Consider discontinuation of mirtazepine permanently  Significant Procedures: PEG tube placement  Significant Labs and Imaging:  Recent Labs  Lab 12/21/20 0719 12/22/20 0518 12/23/20 0120  WBC 7.5 8.1 9.2  HGB 8.8* 8.9* 9.9*  HCT 28.2* 27.7* 32.2*  PLT 277 296 254   Recent Labs  Lab 12/20/20 0458 12/20/20 1647 12/21/20 0719 12/21/20 1702 12/22/20 0518 12/23/20 0120 12/25/20 0011  NA 149*  --  148*  --  144 147* 139  K 3.7  --  3.6  --  4.0 4.1 3.6  CL 118*  --  116*  --  112* 108 104  CO2 23  --  27  --  _1 GLUCOSE 99  --  110*  --  99 94 122*  BUN 20  --  19  --  _2 CREATININE 1.38*  --  1.31*  --  1.26* 1.34* 1.26*  CALCIUM 9.3  --  9.5  --  9.3 9.8 9.5  MG 2.1 2.0 1.9 1.8 2.2  --   --   PHOS 4.1 3.6 3.5 3.8 4.0  --   --   ALKPHOS 42  --  39  --  39 41 40  AST 15  --  18  --  24 42* 24  ALT 13  --  16  --  _0 ALBUMIN  2.4*  --  2.4*  --  2.4* 2.7* 2.5*    Results/Tests Pending at Time of Discharge: None  Discharge Medications:  Allergies as of 12/25/2020   No Known Allergies      Medication List     STOP taking these medications    metoprolol succinate 25 MG 24 hr tablet Commonly known as: TOPROL-XL   mirtazapine 15 MG tablet Commonly known as: REMERON   WAL-TUSSIN COUGH/CHEST DM MAX PO       TAKE these medications    albuterol 108 (90 Base) MCG/ACT inhaler Commonly known as: VENTOLIN HFA Inhale 2 puffs into the lungs every 6 (six) hours as needed.   amLODipine 5 MG tablet Commonly known as: NORVASC Take 5 mg by mouth daily.   levothyroxine 25 MCG tablet Commonly known as: SYNTHROID Place 1 tablet (25 mcg total) into feeding tube daily. Start taking on: December 26, 2020 What changed:  medication strength how much to take how to take this   metoprolol tartrate 25 MG tablet Commonly known as: LOPRESSOR Place 1 tablet (25 mg total) into feeding tube 2 (two) times daily.   neomycin-bacitracin-polymyxin ointment Commonly known as: NEOSPORIN Apply 1 application topically daily for 4 days.   rosuvastatin 40 MG tablet Commonly known as: CRESTOR Take 40 mg by mouth daily.   tiotropium 18 MCG inhalation capsule Commonly known as: SPIRIVA Place 18 mcg into inhaler and inhale daily.               Durable Medical Equipment  (From admission, onward)           Start     Ordered   12/24/20 1152  For home use only DME Shower stool  Once        12/24/20 1151   12/16/20 1457  For home use only DME Shower stool  Once        12/16/20 1456   12/16/20 1456  For home use only DME 3 n 1  Once        12/16/20 1456            Discharge Instructions: Please refer to Patient Instructions section of EMR for full details.  Patient was counseled important signs and symptoms that should prompt return to medical care, changes in medications, dietary instructions, activity  restrictions, and follow up appointments.   Follow-Up Appointments:  Contact information for follow-up providers     [facility] Follow up.   Specialty: [place] Why: HHRN,HHPT,HHOT Contact information: [address] [place] [address] [phone]         [facility] Follow up.   Why: 3 n 1 Contact information: [place] [address] [phone]              Contact information for after-discharge care     Destination     [facility] Preferred SNF .   Service: Skilled Nursing Contact information: [address] [place]  [place], Anton, DO 12/25/2020, 11:27 AM PGY-1, [place]

## 2020-12-25 NOTE — Progress Notes (Signed)
FPTS Interim Night Progress Note  S:Patient sleeping comfortably.  Rounded with primary night RN.  No concerns voiced.  No orders required.    O: Today's Vitals   12/24/20 2000 12/25/20 0008 12/25/20 0345 12/25/20 0427  BP:  (!) 102/52 136/66   Pulse:  73 84   Resp:  20 17   Temp:  98.1 F (36.7 C) 98.1 F (36.7 C)   TempSrc:  Oral Oral   SpO2:  93% 98%   Weight:    43.7 kg  Height:      PainSc: 0-No pain 0-No pain        A/P: Continue current management  Carollee Leitz MD PGY-3, [place] Medicine Service pager [phone]

## 2020-12-25 NOTE — TOC Progression Note (Signed)
Transition of Care [facility]) - Progression Note    Patient Details  Name: [name] [name] MRN: [id number] Date of Birth: 1941-04-19  [facility]) CM/SW Contact  Reece Agar, [place] Phone Number: 12/25/2020, 9:26 AM  Clinical Narrative:    CSW spoke with admin for [facility] who confirmed that she has received auth for pt. CSW shared that pt had COVID test done yesterday and DC is pending DC Summary.   Expected Discharge Plan: Skilled Nursing Facility Barriers to Discharge: Continued Medical Work up  Expected Discharge Plan and Services Expected Discharge Plan: [place] In-house Referral: Clinical Social Work Discharge Planning Services: NA Post Acute Care Choice: [place] Living arrangements for the past 2 months: [place]                 DME Arranged: 3-N-1 DME Agency: [employer] Date DME Agency Contacted: 12/16/20 Time DME Agency Contacted: [phone] Representative spoke with at DME Agency: [place] Arranged: RN, PT, OT [place] Agency: [place] Date [place]: 12/16/20 Time [place]: 9150 Representative spoke with at [place]: [place] ([place]) Interventions    Readmission Risk Interventions No flowsheet data found.

## 2020-12-25 NOTE — Discharge Instructions (Signed)
Dear [name] [name],  Thank you for letting us participate in your care.  You were admitted for shortness of breath and found to have respiratory failure secondary to pneumonia likely due to aspiration and difficulty swallowing.  You are treated appropriately on antibiotics.  He also notably had malnutrition and due to severe difficulty swallowing secondary to postradiation, a PEG tube was placed to ensure you receive proper nutrients.  You are being placed at a skilled nursing facility to assist with your rehab.  POST-HOSPITAL & CARE INSTRUCTIONS 1.  Continue taking medications as prescribed and according to medication list.  New medications that were previously oral have been changed to receiving them via your PEG tube.   Take care and be well!

## 2020-12-25 NOTE — Plan of Care (Signed)

## 2020-12-25 NOTE — TOC Transition Note (Signed)
Transition of Care [facility]) - CM/SW Discharge Note   Patient Details  Name: [name] [name] MRN: [id number] Date of Birth: 02-11-1941  Transition of Care [facility]) CM/SW Contact:  Tresa Endo Phone Number: 12/25/2020, 12:48 PM   Clinical Narrative:    Patient will DC to: [facility] Anticipated DC date: 12/25/2020 Family notified: Pt daughter Transport by: Corey Harold   Per MD patient ready for DC to [facility]. RN to call report prior to discharge [phone]). RN, patient, patient's family, and facility notified of DC. Discharge Summary and FL2 sent to facility. DC packet on chart. Ambulance transport requested for patient.   CSW will sign off for now as social work intervention is no longer needed. Please consult [place] again if new needs arise.     Final next level of care: Skilled Nursing Facility Barriers to Discharge: Continued Medical Work up   Patient Goals and CMS Choice Patient states their goals for this hospitalization and ongoing recovery are:: Rehab CMS Medicare.gov Compare Post Acute Care list provided to:: Patient Choice offered to / list presented to : Patient, Adult Children  Discharge Placement                       Discharge Plan and Services In-house Referral: Clinical Social Work Discharge Planning Services: NA Post Acute Care Choice: [place]          DME Arranged: 3-N-1 DME Agency: [employer] Date DME Agency Contacted: 12/16/20 Time DME Agency Contacted: [phone] Representative spoke with at DME Agency: [place] Arranged: RN, PT, OT [place] Agency: [place] Date [place]: 12/16/20 Time [place]: 3704 Representative spoke with at [place]: [place] ([place]) Interventions     Readmission Risk Interventions No flowsheet data found.

## 2020-12-29 ENCOUNTER — Institutional Professional Consult (permissible substitution): Payer: Medicare Other | Admitting: Pulmonary Disease

## 2020-12-29 DIAGNOSIS — R5381 Other malaise: Secondary | ICD-10-CM | POA: Diagnosis not present

## 2020-12-29 DIAGNOSIS — E039 Hypothyroidism, unspecified: Secondary | ICD-10-CM | POA: Diagnosis not present

## 2020-12-29 DIAGNOSIS — J449 Chronic obstructive pulmonary disease, unspecified: Secondary | ICD-10-CM | POA: Diagnosis not present

## 2021-01-14 DIAGNOSIS — I129 Hypertensive chronic kidney disease with stage 1 through stage 4 chronic kidney disease, or unspecified chronic kidney disease: Secondary | ICD-10-CM | POA: Diagnosis not present

## 2021-01-14 DIAGNOSIS — N184 Chronic kidney disease, stage 4 (severe): Secondary | ICD-10-CM | POA: Diagnosis not present

## 2021-01-14 DIAGNOSIS — R634 Abnormal weight loss: Secondary | ICD-10-CM | POA: Diagnosis not present

## 2021-01-14 DIAGNOSIS — J449 Chronic obstructive pulmonary disease, unspecified: Secondary | ICD-10-CM | POA: Diagnosis not present

## 2021-01-19 DIAGNOSIS — J449 Chronic obstructive pulmonary disease, unspecified: Secondary | ICD-10-CM | POA: Diagnosis not present

## 2021-01-20 DIAGNOSIS — E441 Mild protein-calorie malnutrition: Secondary | ICD-10-CM | POA: Diagnosis not present

## 2021-01-20 DIAGNOSIS — R2689 Other abnormalities of gait and mobility: Secondary | ICD-10-CM | POA: Diagnosis not present

## 2021-01-20 DIAGNOSIS — Z931 Gastrostomy status: Secondary | ICD-10-CM | POA: Diagnosis not present

## 2021-01-20 DIAGNOSIS — J449 Chronic obstructive pulmonary disease, unspecified: Secondary | ICD-10-CM | POA: Diagnosis not present

## 2021-01-20 DIAGNOSIS — M6259 Muscle wasting and atrophy, not elsewhere classified, multiple sites: Secondary | ICD-10-CM | POA: Diagnosis not present

## 2021-01-21 DIAGNOSIS — R1314 Dysphagia, pharyngoesophageal phase: Secondary | ICD-10-CM | POA: Diagnosis not present

## 2021-01-21 DIAGNOSIS — J440 Chronic obstructive pulmonary disease with acute lower respiratory infection: Secondary | ICD-10-CM | POA: Diagnosis not present

## 2021-01-21 DIAGNOSIS — J9601 Acute respiratory failure with hypoxia: Secondary | ICD-10-CM | POA: Diagnosis not present

## 2021-01-21 DIAGNOSIS — I700 Atherosclerosis of aorta: Secondary | ICD-10-CM | POA: Diagnosis not present

## 2021-01-21 DIAGNOSIS — E43 Unspecified severe protein-calorie malnutrition: Secondary | ICD-10-CM | POA: Diagnosis not present

## 2021-01-21 DIAGNOSIS — E039 Hypothyroidism, unspecified: Secondary | ICD-10-CM | POA: Diagnosis not present

## 2021-01-21 DIAGNOSIS — Z431 Encounter for attention to gastrostomy: Secondary | ICD-10-CM | POA: Diagnosis not present

## 2021-01-21 DIAGNOSIS — J449 Chronic obstructive pulmonary disease, unspecified: Secondary | ICD-10-CM | POA: Diagnosis not present

## 2021-01-21 DIAGNOSIS — N184 Chronic kidney disease, stage 4 (severe): Secondary | ICD-10-CM | POA: Diagnosis not present

## 2021-01-21 DIAGNOSIS — E782 Mixed hyperlipidemia: Secondary | ICD-10-CM | POA: Diagnosis not present

## 2021-01-21 DIAGNOSIS — I739 Peripheral vascular disease, unspecified: Secondary | ICD-10-CM | POA: Diagnosis not present

## 2021-01-21 DIAGNOSIS — E871 Hypo-osmolality and hyponatremia: Secondary | ICD-10-CM | POA: Diagnosis not present

## 2021-01-21 DIAGNOSIS — Z9981 Dependence on supplemental oxygen: Secondary | ICD-10-CM | POA: Diagnosis not present

## 2021-01-22 DIAGNOSIS — N184 Chronic kidney disease, stage 4 (severe): Secondary | ICD-10-CM | POA: Diagnosis not present

## 2021-01-22 DIAGNOSIS — E782 Mixed hyperlipidemia: Secondary | ICD-10-CM | POA: Diagnosis not present

## 2021-01-22 DIAGNOSIS — E43 Unspecified severe protein-calorie malnutrition: Secondary | ICD-10-CM | POA: Diagnosis not present

## 2021-01-22 DIAGNOSIS — Z431 Encounter for attention to gastrostomy: Secondary | ICD-10-CM | POA: Diagnosis not present

## 2021-01-22 DIAGNOSIS — I739 Peripheral vascular disease, unspecified: Secondary | ICD-10-CM | POA: Diagnosis not present

## 2021-01-22 DIAGNOSIS — R1314 Dysphagia, pharyngoesophageal phase: Secondary | ICD-10-CM | POA: Diagnosis not present

## 2021-01-22 DIAGNOSIS — J440 Chronic obstructive pulmonary disease with acute lower respiratory infection: Secondary | ICD-10-CM | POA: Diagnosis not present

## 2021-01-22 DIAGNOSIS — I700 Atherosclerosis of aorta: Secondary | ICD-10-CM | POA: Diagnosis not present

## 2021-01-22 DIAGNOSIS — J449 Chronic obstructive pulmonary disease, unspecified: Secondary | ICD-10-CM | POA: Diagnosis not present

## 2021-01-22 DIAGNOSIS — E871 Hypo-osmolality and hyponatremia: Secondary | ICD-10-CM | POA: Diagnosis not present

## 2021-01-22 DIAGNOSIS — E039 Hypothyroidism, unspecified: Secondary | ICD-10-CM | POA: Diagnosis not present

## 2021-01-22 DIAGNOSIS — Z9981 Dependence on supplemental oxygen: Secondary | ICD-10-CM | POA: Diagnosis not present

## 2021-01-22 DIAGNOSIS — J9601 Acute respiratory failure with hypoxia: Secondary | ICD-10-CM | POA: Diagnosis not present

## 2021-01-23 DIAGNOSIS — J449 Chronic obstructive pulmonary disease, unspecified: Secondary | ICD-10-CM | POA: Diagnosis present

## 2021-01-24 ENCOUNTER — Other Ambulatory Visit: Payer: Self-pay

## 2021-01-24 ENCOUNTER — Telehealth: Payer: Self-pay | Admitting: Family Medicine

## 2021-01-24 ENCOUNTER — Inpatient Hospital Stay (HOSPITAL_COMMUNITY)
Admission: EM | Admit: 2021-01-24 | Discharge: 2021-02-01 | DRG: 871 | Disposition: A | Discharge status: Home Health | Payer: Medicare Other | Attending: Family Medicine | Admitting: Family Medicine

## 2021-01-24 ENCOUNTER — Encounter (HOSPITAL_COMMUNITY): Payer: Self-pay

## 2021-01-24 ENCOUNTER — Emergency Department (HOSPITAL_COMMUNITY): Payer: Medicare Other

## 2021-01-24 DIAGNOSIS — Z90710 Acquired absence of both cervix and uterus: Secondary | ICD-10-CM

## 2021-01-24 DIAGNOSIS — J9621 Acute and chronic respiratory failure with hypoxia: Secondary | ICD-10-CM | POA: Diagnosis not present

## 2021-01-24 DIAGNOSIS — R54 Age-related physical debility: Secondary | ICD-10-CM | POA: Diagnosis not present

## 2021-01-24 DIAGNOSIS — R1314 Dysphagia, pharyngoesophageal phase: Secondary | ICD-10-CM

## 2021-01-24 DIAGNOSIS — R059 Cough, unspecified: Secondary | ICD-10-CM | POA: Diagnosis not present

## 2021-01-24 DIAGNOSIS — R509 Fever, unspecified: Secondary | ICD-10-CM | POA: Diagnosis not present

## 2021-01-24 DIAGNOSIS — Z515 Encounter for palliative care: Secondary | ICD-10-CM

## 2021-01-24 DIAGNOSIS — E039 Hypothyroidism, unspecified: Secondary | ICD-10-CM | POA: Diagnosis present

## 2021-01-24 DIAGNOSIS — F32A Depression, unspecified: Secondary | ICD-10-CM | POA: Diagnosis not present

## 2021-01-24 DIAGNOSIS — I129 Hypertensive chronic kidney disease with stage 1 through stage 4 chronic kidney disease, or unspecified chronic kidney disease: Secondary | ICD-10-CM | POA: Diagnosis not present

## 2021-01-24 DIAGNOSIS — N179 Acute kidney failure, unspecified: Secondary | ICD-10-CM | POA: Diagnosis not present

## 2021-01-24 DIAGNOSIS — E43 Unspecified severe protein-calorie malnutrition: Secondary | ICD-10-CM | POA: Diagnosis present

## 2021-01-24 DIAGNOSIS — J189 Pneumonia, unspecified organism: Secondary | ICD-10-CM | POA: Diagnosis present

## 2021-01-24 DIAGNOSIS — E876 Hypokalemia: Secondary | ICD-10-CM

## 2021-01-24 DIAGNOSIS — Z931 Gastrostomy status: Secondary | ICD-10-CM

## 2021-01-24 DIAGNOSIS — Z66 Do not resuscitate: Secondary | ICD-10-CM | POA: Diagnosis not present

## 2021-01-24 DIAGNOSIS — N1832 Chronic kidney disease, stage 3b: Secondary | ICD-10-CM | POA: Diagnosis not present

## 2021-01-24 DIAGNOSIS — E878 Other disorders of electrolyte and fluid balance, not elsewhere classified: Secondary | ICD-10-CM | POA: Diagnosis present

## 2021-01-24 DIAGNOSIS — Z681 Body mass index (BMI) 19 or less, adult: Secondary | ICD-10-CM | POA: Diagnosis not present

## 2021-01-24 DIAGNOSIS — R531 Weakness: Secondary | ICD-10-CM | POA: Diagnosis not present

## 2021-01-24 DIAGNOSIS — Y95 Nosocomial condition: Secondary | ICD-10-CM | POA: Diagnosis present

## 2021-01-24 DIAGNOSIS — D649 Anemia, unspecified: Secondary | ICD-10-CM | POA: Diagnosis present

## 2021-01-24 DIAGNOSIS — I739 Peripheral vascular disease, unspecified: Secondary | ICD-10-CM | POA: Diagnosis present

## 2021-01-24 DIAGNOSIS — A419 Sepsis, unspecified organism: Secondary | ICD-10-CM | POA: Diagnosis not present

## 2021-01-24 DIAGNOSIS — I959 Hypotension, unspecified: Secondary | ICD-10-CM | POA: Diagnosis not present

## 2021-01-24 DIAGNOSIS — Z8501 Personal history of malignant neoplasm of esophagus: Secondary | ICD-10-CM

## 2021-01-24 DIAGNOSIS — Z7951 Long term (current) use of inhaled steroids: Secondary | ICD-10-CM

## 2021-01-24 DIAGNOSIS — R64 Cachexia: Secondary | ICD-10-CM | POA: Diagnosis present

## 2021-01-24 DIAGNOSIS — Z7189 Other specified counseling: Secondary | ICD-10-CM | POA: Diagnosis not present

## 2021-01-24 DIAGNOSIS — Z79899 Other long term (current) drug therapy: Secondary | ICD-10-CM

## 2021-01-24 DIAGNOSIS — Z79890 Hormone replacement therapy (postmenopausal): Secondary | ICD-10-CM

## 2021-01-24 DIAGNOSIS — E861 Hypovolemia: Secondary | ICD-10-CM | POA: Diagnosis present

## 2021-01-24 DIAGNOSIS — R1310 Dysphagia, unspecified: Secondary | ICD-10-CM | POA: Diagnosis not present

## 2021-01-24 DIAGNOSIS — E785 Hyperlipidemia, unspecified: Secondary | ICD-10-CM | POA: Diagnosis present

## 2021-01-24 DIAGNOSIS — R404 Transient alteration of awareness: Secondary | ICD-10-CM | POA: Diagnosis not present

## 2021-01-24 DIAGNOSIS — K5900 Constipation, unspecified: Secondary | ICD-10-CM | POA: Diagnosis present

## 2021-01-24 DIAGNOSIS — R5381 Other malaise: Secondary | ICD-10-CM | POA: Diagnosis not present

## 2021-01-24 DIAGNOSIS — R0602 Shortness of breath: Secondary | ICD-10-CM | POA: Diagnosis not present

## 2021-01-24 DIAGNOSIS — J439 Emphysema, unspecified: Secondary | ICD-10-CM | POA: Diagnosis present

## 2021-01-24 DIAGNOSIS — Z9981 Dependence on supplemental oxygen: Secondary | ICD-10-CM

## 2021-01-24 DIAGNOSIS — Z87891 Personal history of nicotine dependence: Secondary | ICD-10-CM

## 2021-01-24 DIAGNOSIS — E870 Hyperosmolality and hypernatremia: Secondary | ICD-10-CM

## 2021-01-24 DIAGNOSIS — E161 Other hypoglycemia: Secondary | ICD-10-CM | POA: Diagnosis not present

## 2021-01-24 DIAGNOSIS — Z923 Personal history of irradiation: Secondary | ICD-10-CM

## 2021-01-24 DIAGNOSIS — E860 Dehydration: Secondary | ICD-10-CM | POA: Diagnosis not present

## 2021-01-24 DIAGNOSIS — R6889 Other general symptoms and signs: Secondary | ICD-10-CM | POA: Diagnosis not present

## 2021-01-24 DIAGNOSIS — Z20822 Contact with and (suspected) exposure to covid-19: Secondary | ICD-10-CM | POA: Diagnosis present

## 2021-01-24 DIAGNOSIS — I499 Cardiac arrhythmia, unspecified: Secondary | ICD-10-CM | POA: Diagnosis not present

## 2021-01-24 DIAGNOSIS — Z743 Need for continuous supervision: Secondary | ICD-10-CM | POA: Diagnosis not present

## 2021-01-24 DIAGNOSIS — R0603 Acute respiratory distress: Secondary | ICD-10-CM | POA: Diagnosis not present

## 2021-01-24 LAB — URINALYSIS, ROUTINE W REFLEX MICROSCOPIC
Bilirubin Urine: NEGATIVE
Glucose, UA: NEGATIVE mg/dL
Hgb urine dipstick: NEGATIVE
Ketones, ur: NEGATIVE mg/dL
Leukocytes,Ua: NEGATIVE
Nitrite: NEGATIVE
Protein, ur: 30 mg/dL — AB
Specific Gravity, Urine: 1.01 (ref 1.005–1.030)
pH: 8.5 — ABNORMAL HIGH (ref 5.0–8.0)

## 2021-01-24 LAB — COMPREHENSIVE METABOLIC PANEL
ALT: 311 U/L — ABNORMAL HIGH (ref 0–44)
ALT: 260 U/L — ABNORMAL HIGH (ref 0–44)
AST: 234 U/L — ABNORMAL HIGH (ref 15–41)
AST: 177 U/L — ABNORMAL HIGH (ref 15–41)
Albumin: 3 g/dL — ABNORMAL LOW (ref 3.5–5.0)
Albumin: 2.7 g/dL — ABNORMAL LOW (ref 3.5–5.0)
Alkaline Phosphatase: 66 U/L (ref 38–126)
Alkaline Phosphatase: 52 U/L (ref 38–126)
BUN: 85 mg/dL — ABNORMAL HIGH (ref 8–23)
BUN: 84 mg/dL — ABNORMAL HIGH (ref 8–23)
CO2: 30 mmol/L (ref 22–32)
CO2: 25 mmol/L (ref 22–32)
Calcium: 8.2 mg/dL — ABNORMAL LOW (ref 8.9–10.3)
Calcium: 8.3 mg/dL — ABNORMAL LOW (ref 8.9–10.3)
Chloride: >130 mmol/L (ref 98–111)
Chloride: >130 mmol/L (ref 98–111)
Creatinine, Ser: 2.76 mg/dL — ABNORMAL HIGH (ref 0.44–1.00)
Creatinine, Ser: 2.63 mg/dL — ABNORMAL HIGH (ref 0.44–1.00)
GFR, Estimated: 17 mL/min — ABNORMAL LOW (ref >60)
GFR, Estimated: 18 mL/min — ABNORMAL LOW (ref >60)
Glucose, Bld: 169 mg/dL — ABNORMAL HIGH (ref 70–99)
Glucose, Bld: 122 mg/dL — ABNORMAL HIGH (ref 70–99)
Potassium: 4.2 mmol/L (ref 3.5–5.1)
Potassium: 4 mmol/L (ref 3.5–5.1)
Sodium: 170 mmol/L (ref 135–145)
Sodium: 172 mmol/L (ref 135–145)
Total Bilirubin: 0.6 mg/dL (ref 0.3–1.2)
Total Bilirubin: 0.7 mg/dL (ref 0.3–1.2)
Total Protein: 6.6 g/dL (ref 6.5–8.1)
Total Protein: 5.9 g/dL — ABNORMAL LOW (ref 6.5–8.1)

## 2021-01-24 LAB — I-STAT ARTERIAL BLOOD GAS, ED
Acid-Base Excess: 4 mmol/L — ABNORMAL HIGH (ref 0.0–2.0)
Bicarbonate: 27.8 mmol/L (ref 20.0–28.0)
Calcium, Ion: 1.15 mmol/L (ref 1.15–1.40)
HCT: 22 % — ABNORMAL LOW (ref 36.0–46.0)
Hemoglobin: 7.5 g/dL — ABNORMAL LOW (ref 12.0–15.0)
O2 Saturation: 93 %
Patient temperature: 98.6
Potassium: 3.8 mmol/L (ref 3.5–5.1)
Sodium: 173 mmol/L (ref 135–145)
TCO2: 29 mmol/L (ref 22–32)
pCO2 arterial: 35.9 mmHg (ref 32.0–48.0)
pH, Arterial: 7.497 — ABNORMAL HIGH (ref 7.350–7.450)
pO2, Arterial: 62 mmHg — ABNORMAL LOW (ref 83.0–108.0)

## 2021-01-24 LAB — CBC WITH DIFFERENTIAL/PLATELET
Abs Immature Granulocytes: 0 10*3/uL (ref 0.00–0.07)
Basophils Absolute: 0 10*3/uL (ref 0.0–0.1)
Basophils Relative: 0 %
Eosinophils Absolute: 0.5 10*3/uL (ref 0.0–0.5)
Eosinophils Relative: 4 %
HCT: 33.1 % — ABNORMAL LOW (ref 36.0–46.0)
Hemoglobin: 9.2 g/dL — ABNORMAL LOW (ref 12.0–15.0)
Lymphocytes Relative: 13 %
Lymphs Abs: 1.7 10*3/uL (ref 0.7–4.0)
MCH: 28.8 pg (ref 26.0–34.0)
MCHC: 27.8 g/dL — ABNORMAL LOW (ref 30.0–36.0)
MCV: 103.8 fL — ABNORMAL HIGH (ref 80.0–100.0)
Monocytes Absolute: 0.1 10*3/uL (ref 0.1–1.0)
Monocytes Relative: 1 %
Neutro Abs: 10.7 10*3/uL — ABNORMAL HIGH (ref 1.7–7.7)
Neutrophils Relative %: 82 %
Platelets: 144 10*3/uL — ABNORMAL LOW (ref 150–400)
RBC: 3.19 MIL/uL — ABNORMAL LOW (ref 3.87–5.11)
RDW: 17.9 % — ABNORMAL HIGH (ref 11.5–15.5)
WBC: 13.1 10*3/uL — ABNORMAL HIGH (ref 4.0–10.5)
nRBC: 0.3 % — ABNORMAL HIGH (ref 0.0–0.2)
nRBC: 3 /100 WBC — ABNORMAL HIGH

## 2021-01-24 LAB — MRSA NEXT GEN BY PCR, NASAL: MRSA by PCR Next Gen: NOT DETECTED

## 2021-01-24 LAB — CK: Total CK: 115 U/L (ref 38–234)

## 2021-01-24 LAB — I-STAT VENOUS BLOOD GAS, ED
Acid-Base Excess: 7 mmol/L — ABNORMAL HIGH (ref 0.0–2.0)
Bicarbonate: 26.9 mmol/L (ref 20.0–28.0)
Calcium, Ion: 0.82 mmol/L — CL (ref 1.15–1.40)
HCT: 23 % — ABNORMAL LOW (ref 36.0–46.0)
Hemoglobin: 7.8 g/dL — ABNORMAL LOW (ref 12.0–15.0)
O2 Saturation: 100 %
Potassium: 4.2 mmol/L (ref 3.5–5.1)
Sodium: 168 mmol/L (ref 135–145)
TCO2: 28 mmol/L (ref 22–32)
pCO2, Ven: 20.9 mmHg — ABNORMAL LOW (ref 44.0–60.0)
pH, Ven: 7.719 (ref 7.250–7.430)
pO2, Ven: 128 mmHg — ABNORMAL HIGH (ref 32.0–45.0)

## 2021-01-24 LAB — RESP PANEL BY RT-PCR (FLU A&B, COVID) ARPGX2
Influenza A by PCR: NEGATIVE
Influenza B by PCR: NEGATIVE
SARS Coronavirus 2 by RT PCR: NEGATIVE

## 2021-01-24 LAB — URINALYSIS, MICROSCOPIC (REFLEX): Bacteria, UA: NONE SEEN

## 2021-01-24 LAB — APTT: aPTT: 29 seconds (ref 24–36)

## 2021-01-24 LAB — PROTIME-INR
INR: 1.1 (ref 0.8–1.2)
Prothrombin Time: 14.7 seconds (ref 11.4–15.2)

## 2021-01-24 LAB — LACTIC ACID, PLASMA: Lactic Acid, Venous: 1.9 mmol/L (ref 0.5–1.9)

## 2021-01-24 MED ORDER — AEROCHAMBER PLUS FLO-VU LARGE MISC
Status: AC
  Administered 2021-01-24: 1
  Filled 2021-01-24: qty 1

## 2021-01-24 MED ORDER — UMECLIDINIUM BROMIDE 62.5 MCG/INH IN AEPB
1.0000 | INHALATION_SPRAY | Freq: Every day | RESPIRATORY_TRACT | Status: DC
  Administered 2021-01-27 – 2021-01-31 (×5): 1 via RESPIRATORY_TRACT
  Filled 2021-01-24: qty 7

## 2021-01-24 MED ORDER — ALBUTEROL SULFATE HFA 108 (90 BASE) MCG/ACT IN AERS
4.0000 | INHALATION_SPRAY | Freq: Once | RESPIRATORY_TRACT | Status: AC
  Administered 2021-01-24: 4 via RESPIRATORY_TRACT
  Filled 2021-01-24: qty 6.7

## 2021-01-24 MED ORDER — LACTATED RINGERS IV SOLN
INTRAVENOUS | Status: DC
Start: 2021-01-24 — End: 2021-01-25

## 2021-01-24 MED ORDER — LACTATED RINGERS IV BOLUS
1000.0000 mL | Freq: Once | INTRAVENOUS | Status: AC
  Administered 2021-01-24: 1000 mL via INTRAVENOUS

## 2021-01-24 MED ORDER — ROSUVASTATIN CALCIUM 20 MG PO TABS
40.0000 mg | ORAL_TABLET | Freq: Every day | ORAL | Status: DC
  Administered 2021-01-25 – 2021-01-31 (×7): 40 mg
  Filled 2021-01-24 (×7): qty 2

## 2021-01-24 MED ORDER — ROSUVASTATIN CALCIUM 20 MG PO TABS: 40.0000 mg | ORAL_TABLET | Freq: Every day | ORAL | Status: DC

## 2021-01-24 MED ORDER — ALBUTEROL SULFATE (2.5 MG/3ML) 0.083% IN NEBU
3.0000 mL | INHALATION_SOLUTION | Freq: Four times a day (QID) | RESPIRATORY_TRACT | Status: DC | PRN
  Administered 2021-01-25 – 2021-01-28 (×2): 3 mL via RESPIRATORY_TRACT
  Filled 2021-01-24 (×3): qty 3

## 2021-01-24 MED ORDER — FREE WATER: 200.0000 mL | Status: DC

## 2021-01-24 MED ORDER — VANCOMYCIN HCL IN DEXTROSE 1-5 GM/200ML-% IV SOLN
1000.0000 mg | Freq: Once | INTRAVENOUS | Status: AC
  Administered 2021-01-24: 1000 mg via INTRAVENOUS
  Filled 2021-01-24: qty 200

## 2021-01-24 MED ORDER — ENOXAPARIN SODIUM 30 MG/0.3ML IJ SOSY
30.0000 mg | PREFILLED_SYRINGE | INTRAMUSCULAR | Status: DC
  Administered 2021-01-24 – 2021-01-30 (×7): 30 mg via SUBCUTANEOUS
  Filled 2021-01-24 (×9): qty 0.3

## 2021-01-24 MED ORDER — FREE WATER
200.0000 mL | Status: AC
Start: 2021-01-24 — End: 2021-01-25
  Administered 2021-01-25 (×5): 200 mL

## 2021-01-24 MED ORDER — SODIUM CHLORIDE 0.9 % IV SOLN
2.0000 g | Freq: Once | INTRAVENOUS | Status: AC
  Administered 2021-01-24: 2 g via INTRAVENOUS
  Filled 2021-01-24: qty 2

## 2021-01-24 MED ORDER — SODIUM CHLORIDE 0.9 % IV SOLN
2.0000 g | INTRAVENOUS | Status: AC
  Administered 2021-01-25 – 2021-01-30 (×6): 2 g via INTRAVENOUS
  Filled 2021-01-24 (×6): qty 2

## 2021-01-24 MED ORDER — AEROCHAMBER PLUS FLO-VU LARGE MISC: 1.0000 | Freq: Once | Status: AC

## 2021-01-24 MED ORDER — VANCOMYCIN VARIABLE DOSE PER UNSTABLE RENAL FUNCTION (PHARMACIST DOSING): Status: DC

## 2021-01-24 MED ORDER — LACTATED RINGERS IV BOLUS
500.0000 mL | Freq: Once | INTRAVENOUS | Status: AC
  Administered 2021-01-24: 500 mL via INTRAVENOUS

## 2021-01-24 MED ORDER — TIOTROPIUM BROMIDE MONOHYDRATE 18 MCG IN CAPS: 18.0000 ug | ORAL_CAPSULE | Freq: Every day | RESPIRATORY_TRACT | Status: DC

## 2021-01-24 MED ORDER — LEVOTHYROXINE SODIUM 25 MCG PO TABS
25.0000 ug | ORAL_TABLET | Freq: Every day | ORAL | Status: DC
  Administered 2021-01-25 – 2021-01-27 (×3): 25 ug
  Filled 2021-01-24 (×3): qty 1

## 2021-01-24 NOTE — H&P (Addendum)
[facility] Admission History and Physical Service Pager: [phone]  Patient name: [name] [name] Medical record number: [id number] Date of birth: 1940-12-14 Age: 80 y.o. Gender: female  Primary Care Provider: Maryella Shivers, MD Consultants: Palliative Code Status: DNR/DNI which was confirmed with the daughter/caretaker Preferred Emergency Contact: Daughter Contact Information     Name Relation Home Work Vander Daughter [phone]          Chief Complaint: AMS, SoB  Assessment and Plan: [name] [name] is a 80 y.o. female presenting with AMS in the context of multifocal PNA. PMH is significant for COPD, protein-calorie malnutrition with cahchexia, PEG tube in place, PVD, HLD, HTN, hypothyroidism.   AMS (resolved)  Acute hypoxic respiratory failure in the context of multifocal PNA meeting sepsis criteria Patient fully alert and oriented at baseline. On arrival documented to have decreased mental status. Currently fully alert and oriented on assessment though speaks very soft and is often difficult to hear. Meets SIRS and sepsis criteria with HR>90, WBC>12K, RR>20, and suspected source (CXR with multifocal PNA). UA unremarkable. On Vanc/cefepime (9/11-) for hospital acquired pneumonia (pt discharged from SNF on 9/10), S/P 1 L NS with EMS, currently getting 1L LR in the ED. Sating at 100% on 3L currently. Pt reports SoB, appears slightly dyspneic, able to speak in full sentences when asked to.  Likely cause of patient's sepsis is the multifocal pneumonia.  Patient with significant metabolic derangements (see below), I suspect these are secondary to her sepsis and suggest severe multisystem impacts from hypotension and the effects of the infection on her body.  Due to these palliative care was consulted in the emergency department.  Have also spoken with the patient and the daughter about the level of concern I have because of how ill she is  based off of her lab work. -Admit to progressive under Dr. Ardelia Mems -Covid negative -Continue Vanc/cefepime  -F/u Bcx, Ucx -MRSA swab -VBG to assess acid-base status -Bladder scan for reports of reduced urine output -Strict I&O's -Daily weights -Up with assistance -Lovenox for DVT Ppx -Continuous pulse ox -Continuous cardiac monitoring -Maintenance fluids x1.5 (125 cc) via LR -We will also give 200 cc free water via PEG every 2 hours for 5 total doses (1 L).  With her every 4 hour lab checks we should be able to keep a close watch on her electrolytes.  I did discuss this with pharmacy agrees that this would be an appropriate way to reduce her sodium level.  We will continue to monitor closely for any signs of fluid overload as well as the rate of reduction of her sodium level.  Per chart review she has a previously documented echo from 2012 with mild diastolic dysfunction normal left ventricular systolic function.  Hypovolemic Hypernatremia Sodium 170 on admission, likely 2/2 being volume down d/t sepsis. S/P 1 L NS with EMS, 1L LR in the ED (LR good choice given high chloride). Will try to keep correction less than 10 meq over the next 24 hrs (until 9/11 at 4 PM).  -Q4hr CMPs -We will adjust fluid level to keep correction less than 34meq over 24 hours as stated above -Replace with lactated Ringer's due to hyperchloremia  Hyperchloremia: Initial chloride level read as >130.  Unable to calculate anion gap due to this.  This is often seen acidotic states, however patient's bicarb level is normal and she does not have lactic acidosis at this time.  We will check a VBG.  Patient received  1 L lactated Ringer's in the emergency department with recheck CMP currently pending. -We will get a VBG to add further clarity  Elevated LFTs AST/ALT of 234/311, normal synthetic function with INR of 1.1, likely 2/2 pre-hospital hypotension, reports of systolic at 64 on arrival. Possible that elevated levels  are due to muscle injury as well, the patient was not found down or known to be lying in a still position for a long period of time and has no history of recent trauma-we will check a CK level.  She was also not noted to have hemoglobin on her urinalysis, I would expect her to have this if she was losing myoglobin -IV fluids as above -Continue to trend LFTs -CK level -If they do not improve with fluid levels can consider hepatitis work-up  COPD Requires O2 at night at baseline, currently on 3 L nasal cannula. Exam with extensive crackles.  -Continue Ventolin and Spiriva  Protein-calorie malnutrition Patient with cachexia, albumin of 3.0, PEG tube in place from hospitalization in August.  -Nutrition consult  AKI on CKD Baseline creatinine of 1.3, currently 2.76. Likely pre-renal due to dehydration with BUN to creatinine ratio of greater than 20 and patient, given hypernatremia and patient appearing volume down on exam (dry MM, poor skin turgor). BUN 86, no asterixis. Patient s/p 1L of fluid. -We will give additional 500 cc LR bolus -1.5 times maintenance IV fluids with LR -CMPs as above, monitor Cr, expect improvement if pre-renal etiology, consider further workup if does not improve  Prolonged QT interval EKG with QTcB read as 556, T-waves difficult to appreciate on manual read, may not be accurate. -Repeat EKG tomorrow -Avoid QTC prolonging agents  HTN Some documented low pressures in the ED.  Home medications include amlodipine and metoprolol tartrate -Hold home amlodipine 5 mg (historical med) -Hold home metoprolol 25 mg BID  Hypothyroidism Home medication Synthroid 25 mcg daily.  Was previously on 125 mcg daily per daughter.  Appears to have recently been decreased recently due to TSH of 0.018 (12/15/2020). -Continue home synthroid 25 mcg  HLD -Continue home rosuvastatin 40 mg  Anemia  Hgb of 9.2 on admission, MCV 103.8. Hgb of 8.9, 1 month ago. -B12/folate with morning  labs  Constipation Pt reports lack of Bm for several days, slight ABD discomfort without acute surgical findings. Consider Miralax    Multiple comorbidities Patient with declining health over the past 1 month.  Initial labs show multiple electrolyte abnormalities likely due to patient's sepsis and fluid status with elevated creatinine as well as LFTs. -Palliative consult for goals of care discussion  FEN/GI: pending bedside swallow Prophylaxis: Lovenox 30 mg given AKI on CKD  Disposition: TBD  History of Present Illness:  [name] [name] is a 81 y.o. female presenting with shortness of breath and AMS. Patient states she is not sure what brought her in. She denies pain but states she feels short of breath. Per daughter's report Forde Radon), patient is normally able to ambulate to the bathroom with assistance but was unable to do so yesterday and could not stand. She also began to experience shortness of breath, which has steadily increased in intensity. For this reason, EMS was called and the patient was brought to the ED. Had an extensive discussion with daughter regarding patient's limited life expectancy and the goals of care. She is currently processing the information but does confirm DNR/DNI with continued medical management.   Patient's daughter denies her using any tobacco, alcohol, illicit drugs.  States  that she quit tobacco products greater than 30 years ago.  Review Of Systems: Per HPI with the following additions:  Review of Systems  Constitutional:  Negative for chills and fever.  HENT:  Positive for congestion.   Respiratory:  Positive for shortness of breath. Negative for chest tightness.   Cardiovascular:  Negative for chest pain.  Gastrointestinal:  Positive for constipation and nausea.  Genitourinary:  Positive for decreased urine volume.  Neurological:  Negative for headaches.    Patient Active Problem List   Diagnosis Date Noted   Underweight 12/24/2020    Frailty syndrome in geriatric patient 12/24/2020   Emphysema lung ([place]) 12/24/2020   PEG (percutaneous endoscopic gastrostomy) status ([place]) 12/24/2020   Dysphagia, pharyngoesophageal phase    Sepsis ([place])    Protein-calorie malnutrition, severe 12/16/2020   Aspiration pneumonia ([place]) 12/15/2020   Weight loss, non-intentional 12/15/2020   Cachexia ([place]) 12/15/2020   Carotid artery disease ([place]) 08/13/2019   PVD (peripheral vascular disease) ([place])    HTN (hypertension)    ATHEROSLERO NATV ART EXTREM W/INTERMIT CLAUDICAT 12/22/2009    Past Medical History: Past Medical History:  Diagnosis Date   COPD (chronic obstructive pulmonary disease) ([place])    Depression    Exertional dyspnea    Frailty syndrome in geriatric patient 12/24/2020   HLD (hyperlipidemia)    HTN (hypertension)    Hypothyroidism    PEG (percutaneous endoscopic gastrostomy) status ([place]) 12/24/2020   PVD (peripheral vascular disease) ([place])    occluded right subclavian artery. asymptomatic.     Past Surgical History: Past Surgical History:  Procedure Laterality Date   hysterectomy - unknown type     IR GASTROSTOMY TUBE MOD SED  12/22/2020   THROAT SURGERY      Social History: Social History   Tobacco Use   Smoking status: Former    Types: Cigarettes    Quit date: 05/16/1994    Years since quitting: 26.7   Smokeless tobacco: Never  Substance Use Topics   Alcohol use: No   Drug use: No   Additional social history: Per daughter, quit smoking 30 years ago, has not had alcohol in 65 years, denies illegal drug use Please also refer to relevant sections of EMR.  Family History: No family history on file. Per daughter, no history of lung disease.   Allergies and Medications: No Known Allergies No current facility-administered medications on file prior to encounter.   Current Outpatient Medications on File Prior to Encounter  Medication Sig Dispense Refill   albuterol (VENTOLIN HFA) 108 (90 Base) MCG/ACT  inhaler Inhale 2 puffs into the lungs every 6 (six) hours as needed.     amLODipine (NORVASC) 5 MG tablet Take 5 mg by mouth daily.     levothyroxine (SYNTHROID) 25 MCG tablet Place 1 tablet (25 mcg total) into feeding tube daily.     metoprolol tartrate (LOPRESSOR) 25 MG tablet Place 1 tablet (25 mg total) into feeding tube 2 (two) times daily. 60 tablet    rosuvastatin (CRESTOR) 40 MG tablet Take 40 mg by mouth daily.       tiotropium (SPIRIVA) 18 MCG inhalation capsule Place 18 mcg into inhaler and inhale daily.        Objective: BP 96/64   Pulse (!) 101   Temp 99.8 F (37.7 C) (Rectal)   Resp (!) 21   Ht 5\' 4"  (1.626 m)   Wt 43.1 kg   SpO2 94%   BMI 16.31 kg/m  Physical Exam Vitals reviewed.  Constitutional:  Comments: [name] [name] elderly female  Cardiovascular:     Rate and Rhythm: Normal rate and regular rhythm.     Heart sounds: No murmur heard.    Comments: Unable to palpate radial pulses, cold extremities Pulmonary:     Comments: Some increased work of breathing but able to speak in full sentences, crackles present bilaterally, a few faint end-expiratory wheezes Abdominal:     General: Bowel sounds are normal.     Palpations: Abdomen is soft.     Tenderness: There is abdominal tenderness (mild-no acute surgical findings). There is no guarding.  Skin:    Comments: Poor skin turgor  Neurological:     Mental Status: She is alert and oriented to person, place, and time.     Labs and Imaging: CBC BMET  Recent Labs  Lab 01/24/21 1629 01/24/21 2205 01/24/21 2311  WBC 13.1*  --   --   HGB 9.2*   < > 7.5*  HCT 33.1*   < > 22.0*  PLT 144*  --   --    < > = values in this interval not displayed.   Recent Labs  Lab 01/25/21 0049  NA 169*  K 3.9  CL >130*  CO2 25  BUN 76*  CREATININE 2.46*  GLUCOSE 123*  CALCIUM 8.1*     EKG: NSR QtCB 556, no notable ST changes, T waves difficult to appreciate  Corky Sox PGY-1, Psychiatry FPTS Intern  pager: [phone], text pages welcome  Upper Level Addendum:  I have seen and evaluated this patient along with Dr. Alvie Heidelberg and reviewed the above note, making necessary revisions as appropriate.  I agree with the medical decision making and physical exam as noted above.  Lurline Del, DO PGY-3 [facility]

## 2021-01-24 NOTE — ED Triage Notes (Signed)
"  BP originally 64 systolic on our arrival and oxygen saturation 86, stabilized with 1 liter of NS given and 3L of oxygen via [place]" per EMS

## 2021-01-24 NOTE — ED Triage Notes (Signed)
"  EMS called for patient having a decrease in urine output, lethargy, fever, and cough. She is normally altered mental status at baseline, she had recent feeding tube placed, then rehab at [facility] and came home on 9/4" per EMS

## 2021-01-24 NOTE — ED Provider Notes (Signed)
[facility] EMERGENCY DEPARTMENT Provider Note   CSN: [id number] Arrival date & time: 01/24/21  1503     History Chief Complaint  Patient presents with   Fatigue    [name] [name] is a 80 y.o. female.  HPI Patient is 80 year old female with a history of COPD, hyperlipidemia, hypertension, hypothyroidism, PEG tube in place, PVD, who presents to the emergency department via EMS due to altered mental status.  Patient admitted last month and discharged to a SNF facility.  She was discharged back home about 1 week ago.  Her daughter noted to EMS that she was initially doing well, was A&O x3, and ambulating without difficulty.  Over the past few days she began having worsening altered mental status and shortness of breath.  EMS was called today and noted patient was hypoxic to the low 90s, hypotensive, and temperature was about 99.9 F orally.  She was given 1 L of IV fluids with improvement in her blood pressure.  I spoke to patient's daughter [name] [name].  She states that she was discharged from a SNF about 1 week ago.  She initially was A&O x3 and ambulating with a walker with assistance.  Over the past 2 to 3 days she became progressively weak and is now unable to ambulate.  She states "I am having to carry her to the bathroom".  She states this morning she appeared more short of breath than normal and was not responding to nebulizers at home so she called EMS.  She states that she is prescribed oxygen to wear at night but typically does not have an oxygen requirement during the day.  She states that her mother is a DNR.    Past Medical History:  Diagnosis Date   COPD (chronic obstructive pulmonary disease) (HCC)    Depression    Exertional dyspnea    Frailty syndrome in geriatric patient 12/24/2020   HLD (hyperlipidemia)    HTN (hypertension)    Hypothyroidism    PEG (percutaneous endoscopic gastrostomy) status ([place]) 12/24/2020   PVD (peripheral vascular disease) ([place])     occluded right subclavian artery. asymptomatic.     Patient Active Problem List   Diagnosis Date Noted   Underweight 12/24/2020   Frailty syndrome in geriatric patient 12/24/2020   Emphysema lung ([place]) 12/24/2020   PEG (percutaneous endoscopic gastrostomy) status ([place]) 12/24/2020   Dysphagia, pharyngoesophageal phase    Sepsis ([place])    Protein-calorie malnutrition, severe 12/16/2020   Aspiration pneumonia ([place]) 12/15/2020   Weight loss, non-intentional 12/15/2020   Cachexia ([place]) 12/15/2020   Carotid artery disease ([place]) 08/13/2019   PVD (peripheral vascular disease) ([place])    HTN (hypertension)    ATHEROSLERO NATV ART EXTREM W/INTERMIT CLAUDICAT 12/22/2009    Past Surgical History:  Procedure Laterality Date   hysterectomy - unknown type     IR GASTROSTOMY TUBE MOD SED  12/22/2020   THROAT SURGERY       OB History   No obstetric history on file.     No family history on file.  Social History   Tobacco Use   Smoking status: Former    Types: Cigarettes    Quit date: 05/16/1994    Years since quitting: 26.7   Smokeless tobacco: Never  Substance Use Topics   Alcohol use: No   Drug use: No    Home Medications Prior to Admission medications   Medication Sig Start Date End Date Taking? Authorizing Provider  albuterol (VENTOLIN HFA) 108 (90 Base) MCG/ACT inhaler  Inhale 2 puffs into the lungs every 6 (six) hours as needed. 11/09/20   [provider]  amLODipine (NORVASC) 5 MG tablet Take 5 mg by mouth daily. 09/18/20   [provider]  levothyroxine (SYNTHROID) 25 MCG tablet Place 1 tablet (25 mcg total) into feeding tube daily. 12/26/20   Simmons-Robinson, Riki Sheer, MD  metoprolol tartrate (LOPRESSOR) 25 MG tablet Place 1 tablet (25 mg total) into feeding tube 2 (two) times daily. 12/25/20   Simmons-Robinson, Makiera, MD  rosuvastatin (CRESTOR) 40 MG tablet Take 40 mg by mouth daily.      [provider]  tiotropium (SPIRIVA) 18 MCG inhalation capsule  Place 18 mcg into inhaler and inhale daily.      [provider]    Allergies    Patient has no known allergies.  Review of Systems   Review of Systems  Unable to perform ROS: Acuity of condition   Physical Exam Updated Vital Signs BP (!) 93/53   Pulse 94   Temp 99.8 F (37.7 C) (Rectal)   Resp 17   Ht 5\' 4"  (1.626 m)   Wt 43.1 kg   SpO2 97%   BMI 16.31 kg/m   Physical Exam Vitals and nursing note reviewed.  Constitutional:      General: She is not in acute distress.    Appearance: She is not ill-appearing, toxic-appearing or diaphoretic.  HENT:     Head: Normocephalic and atraumatic.     Right Ear: External ear normal.     Left Ear: External ear normal.     Nose: Nose normal.     Mouth/Throat:     Mouth: Mucous membranes are moist.     Pharynx: Oropharynx is clear. No oropharyngeal exudate or posterior oropharyngeal erythema.  Eyes:     General: No scleral icterus.       Right eye: No discharge.        Left eye: No discharge.     Extraocular Movements: Extraocular movements intact.     Conjunctiva/sclera: Conjunctivae normal.  Cardiovascular:     Rate and Rhythm: Regular rhythm. Tachycardia present.     Pulses: Normal pulses.     Heart sounds: Normal heart sounds. No murmur heard.   No friction rub. No gallop.  Pulmonary:     Effort: Respiratory distress present.     Breath sounds: No stridor. Wheezing and rhonchi present. No rales.  Abdominal:     General: Abdomen is flat.     Palpations: Abdomen is soft.     Tenderness: There is no abdominal tenderness.  Musculoskeletal:        General: Normal range of motion.     Cervical back: Normal range of motion and neck supple. No tenderness.  Skin:    General: Skin is warm and dry.  Neurological:     General: No focal deficit present.     Mental Status: She is alert.    ED Results / Procedures / Treatments   Labs (all labs ordered are listed, but only abnormal results are displayed) Labs Reviewed   COMPREHENSIVE METABOLIC PANEL - Abnormal; Notable for the following components:      Result Value   Sodium 170 (*)    Chloride >130 (*)    Glucose, Bld 169 (*)    BUN 85 (*)    Creatinine, Ser 2.76 (*)    Calcium 8.2 (*)    Albumin 3.0 (*)    AST 234 (*)    ALT 311 (*)  GFR, Estimated 17 (*)    All other components within normal limits  CBC WITH DIFFERENTIAL/PLATELET - Abnormal; Notable for the following components:   WBC 13.1 (*)    RBC 3.19 (*)    Hemoglobin 9.2 (*)    HCT 33.1 (*)    MCV 103.8 (*)    MCHC 27.8 (*)    RDW 17.9 (*)    Platelets 144 (*)    nRBC 0.3 (*)    Neutro Abs 10.7 (*)    nRBC 3 (*)    All other components within normal limits  URINALYSIS, ROUTINE W REFLEX MICROSCOPIC - Abnormal; Notable for the following components:   pH 8.5 (*)    Protein, ur 30 (*)    All other components within normal limits  RESP PANEL BY RT-PCR (FLU A&B, COVID) ARPGX2  CULTURE, BLOOD (SINGLE)  URINE CULTURE  MRSA NEXT GEN BY PCR, NASAL  LACTIC ACID, PLASMA  PROTIME-INR  APTT  URINALYSIS, MICROSCOPIC (REFLEX)    EKG EKG Interpretation  Date/Time:  Sunday January 24 2021 15:25:25 EDT Ventricular Rate:  98 PR Interval:  121 QRS Duration: 81 QT Interval:  435 QTC Calculation: 556 R Axis:   4 Text Interpretation: Sinus rhythm No sig change from prior ecg Dec 16 2020, no STEMI Confirmed by Octaviano Glow [phone]) on 01/24/2021 4:25:10 PM  Radiology DG Chest Port 1 View  Result Date: 01/24/2021 CLINICAL DATA:  Sepsis, lethargy, fever, cough EXAM: PORTABLE CHEST 1 VIEW COMPARISON:  12/16/2020 FINDINGS: Single frontal view of the chest demonstrates an unremarkable cardiac silhouette. There is increased interstitial prominence throughout the lungs, with patchy ground-glass airspace disease in the right perihilar region. Airspace disease versus atelectasis at the left lung base. No effusion or pneumothorax. No acute bony abnormalities. IMPRESSION: 1. Diffuse interstitial  opacities, with patchy ground-glass airspace disease, most consistent with multifocal pneumonia. Electronically Signed   By: Randa Ngo M.D.   On: 01/24/2021 16:19    Procedures Procedures   Medications Ordered in ED Medications  lactated ringers bolus 1,000 mL (has no administration in time range)  ceFEPIme (MAXIPIME) 2 g in sodium chloride 0.9 % 100 mL IVPB (has no administration in time range)  vancomycin (VANCOCIN) IVPB 1000 mg/200 mL premix (has no administration in time range)  vancomycin variable dose per unstable renal function (pharmacist dosing) (has no administration in time range)  albuterol (VENTOLIN HFA) 108 (90 Base) MCG/ACT inhaler 4 puff (4 puffs Inhalation Given 01/24/21 1642)  AeroChamber Plus Flo-Vu Large MISC 1 each (1 each Other Given 01/24/21 1643)    ED Course  I have reviewed the triage vital signs and the nursing notes.  Pertinent labs & imaging results that were available during my care of the patient were reviewed by me and considered in my medical decision making (see chart for details).    MDM Rules/Calculators/A&P                          Pt is a 80 y.o. female who presents to the emergency department due to worsening altered mental status as well as weakness and shortness of breath.  Labs: CBC with a white count of 13.1, hemoglobin of 9.2, MCV of 103.8, platelets of 144, neutrophils of 10.7. CMP with a sodium greater than 170, chloride greater than 130, glucose of 169, BUN of 85, creatinine 2.76, calcium of 8.2, albumin of 3, AST of 234, ALT of 311, GFR 17. Respiratory panel is negative. UA with  30 protein. Urine culture sent. Blood culture sent.  Imaging: Chest x-ray shows diffuse interstitial opacities with patchy groundglass airspace disease most consistent with multifocal pneumonia.  I, Rayna Sexton, PA-C, personally reviewed and evaluated these images and lab results as part of my medical decision-making.  Patient initially hypoxic in  the high 80s with EMS.  She was started on 3 L via nasal cannula with improvement to the high 90s.  Patient also initially found to be hypotensive and was given 1 L of normal saline in route.  Since arriving to the emergency department her blood pressure once again lowered to about 93/53.  Patient started on a second liter of lactated Ringer's.  Chest x-ray concerning for multifocal pneumonia.  Leukocytosis of 13.1.  Given patient's recent hospitalization and SNF placement will cover for hospital-acquired pneumonia with cefepime as well as vancomycin.  CMP with severe hypernatremia greater than 170 as well as hyperchloremia greater than 130.  Patient discussed with her daughter who confirms her DNR status.  We will place a palliative care consult given her significant comorbidities as well as lab findings today.  Will discuss with the medicine team.  Note: Portions of this report may have been transcribed using voice recognition software. Every effort was made to ensure accuracy; however, inadvertent computerized transcription errors may be present.   Final Clinical Impression(s) / ED Diagnoses Final diagnoses:  Healthcare-associated pneumonia  Hypernatremia  Hyperchloremia  AKI (acute kidney injury) [facility])    Rx / DC Orders ED Discharge Orders     None        Rayna Sexton, PA-C 01/24/21 1839    Wyvonnia Dusky, MD 01/24/21 2245

## 2021-01-24 NOTE — Progress Notes (Addendum)
Pharmacy Antibiotic Note  [name] [name] is a 80 y.o. female admitted on 01/24/2021 with  HAP .  Pharmacy has been consulted for cefepime and vancomycin dosing.  Patient with history of COPD, hyperlipidemia, hypertension, hypothyroidism, PEG tube in place, PVD. Patient presenting with AMS. Patient hypoxic on arrival. Chest x-ray concerning for multifocal pneumonia  Patient's serum creatinine is 2.76 which is significantly above baseline. WBC 13.1.; LA 1.9; afeb  Plan: Cefepime 2g q24h Vancomycin 1000 mg once, subsequent dosing as indicated per random vancomycin level until renal function stable and/or improved, at which time scheduled dosing can be considered Follow-up cultures and de-escalate antibiotics as appropriate. F/u MRSA PCR  Height: 5\' 4"  (162.6 cm) Weight: 43.1 kg (95 lb) IBW/kg (Calculated) : 54.7  Temp (24hrs), Avg:99.4 F (37.4 C), Min:99 F (37.2 C), Max:99.8 F (37.7 C)  Recent Labs  Lab 01/24/21 1629  WBC 13.1*  CREATININE 2.76*  LATICACIDVEN 1.9    Estimated Creatinine Clearance: 10.9 mL/min (A) (by C-G formula based on SCr of 2.76 mg/dL (H)).    No Known Allergies  Antimicrobials this admission: cefepime 9/11 >>  vancomycin 9/11 >>   Microbiology results: Pending  Thank you for allowing pharmacy to be a part of this patient's care.  Lorelei Pont, PharmD, BCPS 01/24/2021 6:27 PM ED Clinical Pharmacist -  [phone]

## 2021-01-24 NOTE — Progress Notes (Signed)
Noted the VBG showing a pH of 7.719, PCO2 of 20.9, bicarb 26.9.  Patient's repeat CMP showed sodium at 172, chloride >130.  Patient is very fluid down on physical exam but am not sure what to make of the respiratory alkalosis as the patient was not tachypneic on exam and per vitals checks does not seem to have been tachypneic since she first came into the ED.  I reached out to CCM for recommendations.  CCM provider I spoke with was also surprised by her VBG results and recommended getting an ABG to confirm in case it was a lab error or stayed in the tube too long.  Will place this order.  Patient's AST, ALT, creatinine have slightly improved after receiving fluids, her sodium is still elevated at 172 with 168 noted when the VBG was checked.  I do believe the fluids are assisting with these labs and the patient clinically appears dehydrated.  Will give another 500 cc LR bolus as well as LR at 1.5 times maintenance (125 cc/hour).  We will continue with fluids and recheck a CMP in 4 hours.

## 2021-01-24 NOTE — Progress Notes (Addendum)
Received a follow-up call from CCM.  The provider I spoke with previously at [facility] was kind enough to reach out to the [facility] attending see any further recommendations with regard to the patient's VBG and electrolytes.  He stated that it does seem like the majority of the patient's labs are due to a significant fluid deficit which he roughly calculated at about 4.9 L.  He recommended replacing this, IV and orally if possible.  Could do IV with LR or normal saline.  Patient has PEG tube so will replace via IV with LR and free water via PEG. He further stated that he did not think it would cause much problem if the sodium dropped even to 150 in this case and would not be as concerned about cerebral edema.  Greatly appreciate CCM's assistance and expertise with this patient.  We will continue to replace her fluid deficit and keep a close watch on her sodium level throughout the night.

## 2021-01-25 DIAGNOSIS — A419 Sepsis, unspecified organism: Secondary | ICD-10-CM | POA: Diagnosis present

## 2021-01-25 DIAGNOSIS — N1832 Chronic kidney disease, stage 3b: Secondary | ICD-10-CM | POA: Diagnosis present

## 2021-01-25 DIAGNOSIS — J439 Emphysema, unspecified: Secondary | ICD-10-CM | POA: Diagnosis present

## 2021-01-25 DIAGNOSIS — K5900 Constipation, unspecified: Secondary | ICD-10-CM | POA: Diagnosis present

## 2021-01-25 DIAGNOSIS — I129 Hypertensive chronic kidney disease with stage 1 through stage 4 chronic kidney disease, or unspecified chronic kidney disease: Secondary | ICD-10-CM | POA: Diagnosis present

## 2021-01-25 DIAGNOSIS — Y95 Nosocomial condition: Secondary | ICD-10-CM | POA: Diagnosis present

## 2021-01-25 DIAGNOSIS — Z515 Encounter for palliative care: Secondary | ICD-10-CM

## 2021-01-25 DIAGNOSIS — E039 Hypothyroidism, unspecified: Secondary | ICD-10-CM | POA: Diagnosis present

## 2021-01-25 DIAGNOSIS — N179 Acute kidney failure, unspecified: Secondary | ICD-10-CM

## 2021-01-25 DIAGNOSIS — J189 Pneumonia, unspecified organism: Secondary | ICD-10-CM

## 2021-01-25 DIAGNOSIS — Z681 Body mass index (BMI) 19 or less, adult: Secondary | ICD-10-CM | POA: Diagnosis not present

## 2021-01-25 DIAGNOSIS — D649 Anemia, unspecified: Secondary | ICD-10-CM | POA: Diagnosis present

## 2021-01-25 DIAGNOSIS — E870 Hyperosmolality and hypernatremia: Secondary | ICD-10-CM | POA: Diagnosis not present

## 2021-01-25 DIAGNOSIS — R0603 Acute respiratory distress: Secondary | ICD-10-CM | POA: Diagnosis not present

## 2021-01-25 DIAGNOSIS — E860 Dehydration: Secondary | ICD-10-CM

## 2021-01-25 DIAGNOSIS — E861 Hypovolemia: Secondary | ICD-10-CM | POA: Diagnosis present

## 2021-01-25 DIAGNOSIS — E878 Other disorders of electrolyte and fluid balance, not elsewhere classified: Secondary | ICD-10-CM | POA: Diagnosis present

## 2021-01-25 DIAGNOSIS — R1310 Dysphagia, unspecified: Secondary | ICD-10-CM | POA: Diagnosis present

## 2021-01-25 DIAGNOSIS — Z20822 Contact with and (suspected) exposure to covid-19: Secondary | ICD-10-CM | POA: Diagnosis present

## 2021-01-25 DIAGNOSIS — R64 Cachexia: Secondary | ICD-10-CM | POA: Diagnosis not present

## 2021-01-25 DIAGNOSIS — R1314 Dysphagia, pharyngoesophageal phase: Secondary | ICD-10-CM | POA: Diagnosis not present

## 2021-01-25 DIAGNOSIS — I739 Peripheral vascular disease, unspecified: Secondary | ICD-10-CM | POA: Diagnosis present

## 2021-01-25 DIAGNOSIS — R54 Age-related physical debility: Secondary | ICD-10-CM | POA: Diagnosis not present

## 2021-01-25 DIAGNOSIS — I959 Hypotension, unspecified: Secondary | ICD-10-CM | POA: Diagnosis present

## 2021-01-25 DIAGNOSIS — E43 Unspecified severe protein-calorie malnutrition: Secondary | ICD-10-CM | POA: Diagnosis not present

## 2021-01-25 DIAGNOSIS — F32A Depression, unspecified: Secondary | ICD-10-CM | POA: Diagnosis present

## 2021-01-25 DIAGNOSIS — Z7189 Other specified counseling: Secondary | ICD-10-CM | POA: Diagnosis not present

## 2021-01-25 DIAGNOSIS — Z66 Do not resuscitate: Secondary | ICD-10-CM | POA: Diagnosis present

## 2021-01-25 DIAGNOSIS — J9621 Acute and chronic respiratory failure with hypoxia: Secondary | ICD-10-CM | POA: Diagnosis present

## 2021-01-25 LAB — COMPREHENSIVE METABOLIC PANEL
ALT: 232 U/L — ABNORMAL HIGH (ref 0–44)
ALT: 244 U/L — ABNORMAL HIGH (ref 0–44)
ALT: 229 U/L — ABNORMAL HIGH (ref 0–44)
ALT: 231 U/L — ABNORMAL HIGH (ref 0–44)
ALT: 210 U/L — ABNORMAL HIGH (ref 0–44)
ALT: 195 U/L — ABNORMAL HIGH (ref 0–44)
AST: 143 U/L — ABNORMAL HIGH (ref 15–41)
AST: 139 U/L — ABNORMAL HIGH (ref 15–41)
AST: 122 U/L — ABNORMAL HIGH (ref 15–41)
AST: 116 U/L — ABNORMAL HIGH (ref 15–41)
AST: 113 U/L — ABNORMAL HIGH (ref 15–41)
AST: 96 U/L — ABNORMAL HIGH (ref 15–41)
Albumin: 2.5 g/dL — ABNORMAL LOW (ref 3.5–5.0)
Albumin: 2.8 g/dL — ABNORMAL LOW (ref 3.5–5.0)
Albumin: 2.7 g/dL — ABNORMAL LOW (ref 3.5–5.0)
Albumin: 2.9 g/dL — ABNORMAL LOW (ref 3.5–5.0)
Albumin: 2.8 g/dL — ABNORMAL LOW (ref 3.5–5.0)
Albumin: 2.6 g/dL — ABNORMAL LOW (ref 3.5–5.0)
Alkaline Phosphatase: 48 U/L (ref 38–126)
Alkaline Phosphatase: 54 U/L (ref 38–126)
Alkaline Phosphatase: 56 U/L (ref 38–126)
Alkaline Phosphatase: 64 U/L (ref 38–126)
Alkaline Phosphatase: 59 U/L (ref 38–126)
Alkaline Phosphatase: 60 U/L (ref 38–126)
Anion gap: 11 (ref 5–15)
Anion gap: 11 (ref 5–15)
BUN: 76 mg/dL — ABNORMAL HIGH (ref 8–23)
BUN: 72 mg/dL — ABNORMAL HIGH (ref 8–23)
BUN: 68 mg/dL — ABNORMAL HIGH (ref 8–23)
BUN: 62 mg/dL — ABNORMAL HIGH (ref 8–23)
BUN: 62 mg/dL — ABNORMAL HIGH (ref 8–23)
BUN: 60 mg/dL — ABNORMAL HIGH (ref 8–23)
CO2: 25 mmol/L (ref 22–32)
CO2: 25 mmol/L (ref 22–32)
CO2: 26 mmol/L (ref 22–32)
CO2: 26 mmol/L (ref 22–32)
CO2: 23 mmol/L (ref 22–32)
CO2: 24 mmol/L (ref 22–32)
Calcium: 8.1 mg/dL — ABNORMAL LOW (ref 8.9–10.3)
Calcium: 8.4 mg/dL — ABNORMAL LOW (ref 8.9–10.3)
Calcium: 8.4 mg/dL — ABNORMAL LOW (ref 8.9–10.3)
Calcium: 8.1 mg/dL — ABNORMAL LOW (ref 8.9–10.3)
Calcium: 8.2 mg/dL — ABNORMAL LOW (ref 8.9–10.3)
Calcium: 7.9 mg/dL — ABNORMAL LOW (ref 8.9–10.3)
Chloride: >130 mmol/L (ref 98–111)
Chloride: >130 mmol/L (ref 98–111)
Chloride: >130 mmol/L (ref 98–111)
Chloride: 128 mmol/L — ABNORMAL HIGH (ref 98–111)
Chloride: >130 mmol/L (ref 98–111)
Chloride: 128 mmol/L — ABNORMAL HIGH (ref 98–111)
Creatinine, Ser: 2.46 mg/dL — ABNORMAL HIGH (ref 0.44–1.00)
Creatinine, Ser: 2.54 mg/dL — ABNORMAL HIGH (ref 0.44–1.00)
Creatinine, Ser: 2.46 mg/dL — ABNORMAL HIGH (ref 0.44–1.00)
Creatinine, Ser: 2.33 mg/dL — ABNORMAL HIGH (ref 0.44–1.00)
Creatinine, Ser: 2.39 mg/dL — ABNORMAL HIGH (ref 0.44–1.00)
Creatinine, Ser: 2.38 mg/dL — ABNORMAL HIGH (ref 0.44–1.00)
GFR, Estimated: 19 mL/min — ABNORMAL LOW (ref >60)
GFR, Estimated: 18 mL/min — ABNORMAL LOW (ref >60)
GFR, Estimated: 19 mL/min — ABNORMAL LOW (ref >60)
GFR, Estimated: 21 mL/min — ABNORMAL LOW (ref >60)
GFR, Estimated: 20 mL/min — ABNORMAL LOW (ref >60)
GFR, Estimated: 20 mL/min — ABNORMAL LOW (ref >60)
Glucose, Bld: 123 mg/dL — ABNORMAL HIGH (ref 70–99)
Glucose, Bld: 125 mg/dL — ABNORMAL HIGH (ref 70–99)
Glucose, Bld: 113 mg/dL — ABNORMAL HIGH (ref 70–99)
Glucose, Bld: 107 mg/dL — ABNORMAL HIGH (ref 70–99)
Glucose, Bld: 98 mg/dL (ref 70–99)
Glucose, Bld: 118 mg/dL — ABNORMAL HIGH (ref 70–99)
Potassium: 3.9 mmol/L (ref 3.5–5.1)
Potassium: 4 mmol/L (ref 3.5–5.1)
Potassium: 4 mmol/L (ref 3.5–5.1)
Potassium: 3.9 mmol/L (ref 3.5–5.1)
Potassium: 4 mmol/L (ref 3.5–5.1)
Potassium: 3.7 mmol/L (ref 3.5–5.1)
Sodium: 169 mmol/L (ref 135–145)
Sodium: 168 mmol/L (ref 135–145)
Sodium: 167 mmol/L (ref 135–145)
Sodium: 165 mmol/L (ref 135–145)
Sodium: 164 mmol/L (ref 135–145)
Sodium: 163 mmol/L (ref 135–145)
Total Bilirubin: 0.8 mg/dL (ref 0.3–1.2)
Total Bilirubin: 0.8 mg/dL (ref 0.3–1.2)
Total Bilirubin: 1.1 mg/dL (ref 0.3–1.2)
Total Bilirubin: 1.2 mg/dL (ref 0.3–1.2)
Total Bilirubin: 1.1 mg/dL (ref 0.3–1.2)
Total Bilirubin: 0.9 mg/dL (ref 0.3–1.2)
Total Protein: 5.5 g/dL — ABNORMAL LOW (ref 6.5–8.1)
Total Protein: 6.1 g/dL — ABNORMAL LOW (ref 6.5–8.1)
Total Protein: 6.1 g/dL — ABNORMAL LOW (ref 6.5–8.1)
Total Protein: 6.7 g/dL (ref 6.5–8.1)
Total Protein: 6.3 g/dL — ABNORMAL LOW (ref 6.5–8.1)
Total Protein: 6.1 g/dL — ABNORMAL LOW (ref 6.5–8.1)

## 2021-01-25 LAB — CBC WITH DIFFERENTIAL/PLATELET
Abs Immature Granulocytes: 0.1 10*3/uL — ABNORMAL HIGH (ref 0.00–0.07)
Basophils Absolute: 0 10*3/uL (ref 0.0–0.1)
Basophils Relative: 0 %
Eosinophils Absolute: 0.1 10*3/uL (ref 0.0–0.5)
Eosinophils Relative: 1 %
HCT: 29.6 % — ABNORMAL LOW (ref 36.0–46.0)
Hemoglobin: 8.1 g/dL — ABNORMAL LOW (ref 12.0–15.0)
Immature Granulocytes: 1 %
Lymphocytes Relative: 17 %
Lymphs Abs: 2.3 10*3/uL (ref 0.7–4.0)
MCH: 28.4 pg (ref 26.0–34.0)
MCHC: 27.4 g/dL — ABNORMAL LOW (ref 30.0–36.0)
MCV: 103.9 fL — ABNORMAL HIGH (ref 80.0–100.0)
Monocytes Absolute: 1.2 10*3/uL — ABNORMAL HIGH (ref 0.1–1.0)
Monocytes Relative: 9 %
Neutro Abs: 9.3 10*3/uL — ABNORMAL HIGH (ref 1.7–7.7)
Neutrophils Relative %: 72 %
Platelets: 128 10*3/uL — ABNORMAL LOW (ref 150–400)
RBC: 2.85 MIL/uL — ABNORMAL LOW (ref 3.87–5.11)
RDW: 17.7 % — ABNORMAL HIGH (ref 11.5–15.5)
WBC: 13.1 10*3/uL — ABNORMAL HIGH (ref 4.0–10.5)
nRBC: 0.4 % — ABNORMAL HIGH (ref 0.0–0.2)

## 2021-01-25 LAB — FOLATE: Folate: 54.3 ng/mL (ref >5.9)

## 2021-01-25 LAB — URINE CULTURE: Culture: NO GROWTH

## 2021-01-25 LAB — VITAMIN B12: Vitamin B-12: 851 pg/mL (ref 180–914)

## 2021-01-25 MED ORDER — DEXTROSE-NACL 5-0.9 % IV SOLN: INTRAVENOUS | Status: DC

## 2021-01-25 MED ORDER — FREE WATER
200.0000 mL | Status: AC
  Administered 2021-01-25 – 2021-01-26 (×3): 200 mL

## 2021-01-25 MED ORDER — SODIUM CHLORIDE 0.45 % IV SOLN: INTRAVENOUS | Status: DC

## 2021-01-25 MED ORDER — FREE WATER
400.0000 mL | Status: DC
  Administered 2021-01-25: 400 mL

## 2021-01-25 NOTE — ED Notes (Signed)
Report given to Phineas Douglas, RN

## 2021-01-25 NOTE — Progress Notes (Signed)
Palliative-   Plan to meet with patient's daughter tomorrow at 41 noon.   Mariana Kaufman, AGNP-C Palliative Medicine  Please call Palliative Medicine team phone with any questions [phone]. For individual providers please see AMION.  No charge

## 2021-01-25 NOTE — Progress Notes (Addendum)
Went to evaluate patient around 1 AM to see how she was doing from a fluid standpoint.  Patient was resting comfortably at that time.  Lungs still showed some crackles but seemed unchanged from prior.  Patient still with poor skin turgor.  No edema of the lower extremities.  We will continue with fluid resuscitation and monitoring labs every 4 hours.

## 2021-01-25 NOTE — ED Notes (Addendum)
Pt cleaned up with full linen change. External urinary cath changed

## 2021-01-25 NOTE — ED Notes (Signed)
Date and time results received: 01/25/21 5:31 AM  Test: Na / Cl Critical Value: 168 / >130   Name of Provider Notified: Vanessa [place], DO  Orders Received? Or Actions Taken?:  n/a

## 2021-01-25 NOTE — Progress Notes (Signed)
Went to check on patient.  Found her resting in bed.  She states that she feels slightly better than when she first came in.  BP 120/64   Pulse 100   Temp 99.8 F (37.7 C) (Rectal)   Resp (!) 23   Ht 5\' 4"  (1.626 m)   Wt 43.1 kg   SpO2 92%   BMI 16.31 kg/m  General: Frail elderly female lying in bed, alert and oriented to person, place, time.  Mucous membranes still appear dry. Respiratory: Crackles present but similar to earlier exam.  Mildly tachypneic on exam Cardiac: RRR Extremities: No pitting edema, poor skin turgor.  A/P: Continue to provide fluids at the previously ordered rates.  Continue to check every 4 hours CMP's to monitor sodium and chloride level as well as her AKI and LFTs.  Her hemoglobin is getting redrawn to confirm.  Noted that the hemoglobin on her VBG and ABG were 7.5 and 7.8-I am not sure I trust the VBG level but the ABG is probably accurate with its hemoglobin.  Patient with no complaints of bleeding and no observed bleeding in her bed.  This is likely dilutional.  We will await the CBC which should come back in the next few minutes to determine if she needs a transfusion.  I already discussed with her the possibility of a transfusion and she is on board with it if it is something she needs.  She technically would have a transfusion threshold of 8.0 with a history of PAD.

## 2021-01-25 NOTE — ED Notes (Signed)
2nd attempt to call report; RN is passing meds and says he will call back shortly.

## 2021-01-25 NOTE — Progress Notes (Addendum)
FPTS Interim Progress Note  S: Went to evaluate patient at the start of shift.  Patient was resting comfortably in bed.  She had no current complaints.  Nasal cannula in place on 3 L.  O: BP (!) 154/76   Pulse 100   Temp 99.8 F (37.7 C) (Rectal)   Resp 18   Ht 5\' 4"  (1.626 m)   Wt 43.1 kg   SpO2 96%   BMI 16.31 kg/m   General: Frail elderly female, awake and alert in no acute distress Heart: RRR with no murmur appreciated Lungs: Nasal cannula in place on 3 L, crackles present bilaterally similar sounding exam to yesterday Skin: Warm and dry Extremities: No lower extremity edema  A/P: 80 year old female presenting with sepsis due to pneumonia with severe electrolyte abnormalities including hyponatremia, hypochloremia, elevated creatinine/AKI, and elevated LFTs.  All of these labs are trending in the correct direction with fluid resuscitation.  We are being cautious with the reduction in her sodium level trying to maintain less than 10 over 24-hour change.  She was admitted with a sodium of 170 at around 11 PM last night so our goal is to keep it above 160 at the next BMP.  She is getting every 4 hours BMPs.  Most recent sodium of 164.  For her pneumonia the vancomycin has been discontinued and she continues on cefepime.  There is a palliative care meeting tomorrow to discuss goals of care.  Remainder per day team progress note  Lurline Del, DO 01/25/2021, 8:16 PM PGY-3, [place] [facility] Service pager [phone]

## 2021-01-25 NOTE — ED Notes (Signed)
Notified resident Kathryne Sharper of pt's critical labs Na+ 164 and Cl- >130

## 2021-01-25 NOTE — ED Notes (Signed)
Date and time results received: 01/25/21 0143  Test: Na / Cl Critical Value: 169 / >130  Name of Provider Notified: Vanessa [place], DO  Orders Received? Or Actions Taken?:  n/a

## 2021-01-25 NOTE — ED Notes (Signed)
Attempted to call report; pt has not yet been assigned to a nurse. Will call back shortly

## 2021-01-25 NOTE — Consult Note (Signed)
Consultation Note Date: 01/25/2021   Patient Name: [name] [name]  DOB: 04-Nov-1940  MRN: [id number]  Age / Sex: 80 y.o., female  PCP: Maryella Shivers, MD Referring Physician: Leeanne Rio, MD  Reason for Consultation: "DNR; patient with severe hypernatremia, hyperchloremia, AKI, as well as new multifocal pneumonia.  Will require admission."  HPI/Patient Profile: 80 y.o. female  with past medical history of recurrent pnuemonia, esophageal cancer s/p radiation treatment with residual dysphagia, PEG tube in place, COPD (on 3L at home), PVD, hypothyroidism, HTN, HLD admitted on 01/24/2021 with altered mental status and acute on chronic respiratory failure in the setting of recurrent multifocal pneumonia, electrolyte disturbances likely due to dehydration, elevated transaminases likely due to hypotension (she was 64 systolic by EMS- has been fluid resuscitated), acute on chronic kidney disease. Palliative medicine consulted for "DNR; patient with severe hypernatremia, hyperchloremia, AKI, as well as new multifocal pneumonia. Will require admission."    Primary Decision Maker PATIENT with support of daughter- Forde Radon  Discussion: Patient recently admitted in August for likely aspiration pneumonia. At that time Palliative was consulted for severe pharyngeal dysphagia and after goals of care discussion decision was made for PEG tube placement- she was subsequently discharged to [facility] for rehab and was discharged home approx 1 week ago. At baseline she was ambulating with a walker after SNF discharge, however has declined to bedbound status in the last week.  On my evaluation Ayannah is awake, but lethargic. She can tell me her name and that she is in the hospital, however, unable to tell me why. She is agreeable when I tell her she has pneumonia- and when I ask her if she's had it before she  tells me that "it never went away".  She identifies her daughter- Forde Radon as her primary caretaker (not at bedside).  I expressed concern to Sheana that she is very ill and asked her feelings about being in the hospital and treating her current illness. She wishes to continue current treatments and says she would be accepting to returning to a facility at discharge.  I attempted to call her daughter, Forde Radon- left a message requesting return call.     SUMMARY OF RECOMMENDATIONS -Recurrent multifocal pneumonia, acute kidney injury, dehydration- likely due to aspiration given established history of significant dysphagia- continue current treatments with antibiotics and IV fluids- this will likely continue to recur and patient will have ongoing cycle of recurrence and readmissions until family and/or patient decide on a more comfort focused path of care- I called patient's daughter and requested a return call to set up a time to meet with her and Ramonia here in the hospital   Code Status/Advance Care Planning: DNR   Prognosis:   Unable to determine  Discharge Planning: To Be Determined  Primary Diagnoses: Present on Admission:  Sepsis ([place])   Review of Systems  Physical Exam  Vital Signs: BP 125/63   Pulse 96   Temp 99.8 F (37.7 C) (Rectal)   Resp (!) 22  Ht 5\' 4"  (1.626 m)   Wt 43.1 kg   SpO2 91%   BMI 16.31 kg/m  Pain Scale: 0-10   Pain Score: 0-No pain   SpO2: SpO2: 91 % O2 Device:SpO2: 91 % O2 Flow Rate: .O2 Flow Rate (L/min): 3 L/min  IO: Intake/output summary:  Intake/Output Summary (Last 24 hours) at 01/25/2021 1603 Last data filed at 01/25/2021 1104 Gross per 24 hour  Intake 1264.58 ml  Output --  Net 1264.58 ml    LBM:   Baseline Weight: Weight: 43.1 kg Most recent weight: Weight: 43.1 kg     Palliative Assessment/Data: PPS: 20%      Thank you for this consult. Palliative medicine will continue to follow and assist as needed.    Time In: 1511 Time Out: 1622 Time Total: 71 mins Greater than 50%  of this time was spent counseling and coordinating care related to the above assessment and plan.  Signed by: Mariana Kaufman, AGNP-C Palliative Medicine    Please contact Palliative Medicine Team phone at [phone] for questions and concerns.  For individual provider: See Shea Evans

## 2021-01-25 NOTE — Progress Notes (Signed)
Family Medicine Teaching Service Daily Progress Note Intern Pager: [phone]  Patient name: [name] [name] Medical record number: [id number] Date of birth: 1941-02-07 Age: 80 y.o. Gender: female  Primary Care Provider: Maryella Shivers, MD Consultants: Palliative Code Status: DNR/DNI which was confirmed with the daughter/caretaker  Pt Overview and Major Events to Date:  -Patient admitted overnight - 01/24/21  Assessment and Plan: [name] [name] is a 80 y.o. female presenting with AMS in the context of multifocal PNA. PMH is significant for COPD, protein-calorie malnutrition with cahchexia, PEG tube in place, PVD, HLD, HTN, hypothyroidism.    AMS (resolved)  Acute hypoxic respiratory failure in the context of multifocal PNA meeting sepsis criteria Mets SIRS and sepsis criteria with HR>90, WBC>12K, RR>20, and suspected source (CXR with multifocal PNA). UA unremarkable. MRSA swab came back negative. Will continue cefepime (9/11-) and discontinue Vancomycin, for hospital acquired pneumonia (pt discharged from SNF on 9/10). -Continue Cefepime D/C vanc for HAP -ABG: pH: 7.497 / pCO2: 35.9 / pO2: 62 / Bicarb 27.8 /  -F/u Bcx, Ucx: NGTD -MRSA swab: Nothing detected -Bladder scan for reports of reduced urine output -Strict I&O's -Daily weights -Up with assistance -Continuous pulse ox -Continuous cardiac monitoring  Goals of Care: -Palliative consult, appreciate recs  Hypovolemic Hypernatremia Sodium 172 on admission, likely 2/2 being volume down d/t sepsis. S/P 4 L total fluid bolus with a calculated free water deficit of 4.6L. Most recent sodium was 165 at noon. Will try to keep correction less than 10 meq over the next 24 hrs (until 9/12 at 4 PM).  -1/2 mivf NS 50 mL/hr -Q4hr CMPs -We will adjust fluid level to keep correction less than 15meq over 24 hours as stated above  Hyperchloremia Initial chloride level read as >130,  now 128.  Unable to calculate anion gap due to this.  This  is often seen acidotic states, however patient's bicarb level is normal and she does not have lactic acidosis at this time.   Elevated LFTs AST/ALT of 234/311, normal synthetic function with INR of 1.1, likely 2/2 pre-hospital hypotension, reports of systolic at 64 on arrival -AST / ALT: 231 / 116 -IV fluids as above -Continue to trend LFTs, if they do not improve with fluid levels can consider hepatitis work-up  COPD Lung exam with coarse breath sounds diffusely and increased work of breathing. Requires O2 at night at baseline, currently on 3 L nasal cannula.  -Continue Ventolin and Spiriva  Protein-calorie malnutrition Patient with cachexia, albumin of 3.0, PEG tube in place from hospitalization in August.  -Nutrition consult   AKI on CKD Baseline creatinine of 1.3, currently 2.76. Likely pre-renal due to dehydration with BUN to creatinine ratio of greater than 20 and patient, given hypernatremia and patient appearing volume down on exam (dry MM, poor skin turgor). BUN 86, no asterixis. Patient s/p 1L of fluid. -Cr: 2.54 [2.76 on admission] -CK: nml 115 -CMPs as above, monitor Cr, expect improvement if pre-renal etiology, consider further workup if does not improve  Prolonged QT interval EKG with QTcB read as 556, T-waves difficult to appreciate on manual read, may not be accurate. -Repeat EKG today showed Qtc 410 -Avoid QTC prolonging agents  HTN Some documented low pressures in the ED.  Home medications include amlodipine and metoprolol tartrate -Hold home amlodipine 5 mg (historical med) -Hold home metoprolol 25 mg BID  Hypothyroidism Home medication Synthroid 25 mcg daily.  Was previously on 125 mcg daily per daughter.  Appears to have recently been decreased recently due  to TSH of 0.018 (12/15/2020). -Continue home synthroid 25 mcg  HLD -Continue home rosuvastatin 40 mg  Anemia  Hgb of 9.2 on admission, MCV 103.8. Hgb of 8.9, 1 month ago. -B12/folate with morning labs,  B12: 851 -Hgb 8.1 today, transfusion goal < 8.0  Constipation Pt reports lack of Bm for several days, slight ABD discomfort without acute surgical findings. Consider Miralax      FEN/GI: Pending bedside swallow PPx: Lovenox 30 mg given AKI on CKD Dispo: Potentially comfort care   pending palliative consult . Barriers include Palliative consult.   Subjective:  No acute events, patient resting in bed this morning, alert and interactive.  Objective: Temp:  [99 F (37.2 C)-99.8 F (37.7 C)] 99.8 F (37.7 C) (09/11 1526) Pulse Rate:  [1-101] 77 (09/12 0600) Resp:  [17-30] 19 (09/12 0600) BP: (92-131)/(52-89) 126/67 (09/12 0600) SpO2:  [91 %-99 %] 92 % (09/12 0600) Weight:  [43.1 kg] 43.1 kg (09/11 1524) Physical Exam: General: Frail, elderly, alert and oriented to person and place, dry mucous membranes Cardiovascular: RRR, NRMG Respiratory: Coarse breath sounds, increased work of breathing, no wheezes or stridor Abdomen: Soft, non-tender, non-distended Extremities: Pulses intact in all extremities  Laboratory: Recent Labs  Lab 01/24/21 1629 01/24/21 2205 01/24/21 2311 01/25/21 0524  WBC 13.1*  --   --  13.1*  HGB 9.2* 7.8* 7.5* 8.1*  HCT 33.1* 23.0* 22.0* 29.6*  PLT 144*  --   --  128*   Recent Labs  Lab 01/24/21 2048 01/24/21 2205 01/24/21 2311 01/25/21 0049 01/25/21 0436  NA 172*   < > 173* 169* 168*  K 4.0   < > 3.8 3.9 4.0  CL >130*  --   --  >130* >130*  CO2 25  --   --  25 25  BUN 84*  --   --  76* 72*  CREATININE 2.63*  --   --  2.46* 2.54*  CALCIUM 8.3*  --   --  8.1* 8.4*  PROT 5.9*  --   --  5.5* 6.1*  BILITOT 0.7  --   --  0.8 0.8  ALKPHOS 52  --   --  48 54  ALT 260*  --   --  232* 244*  AST 177*  --   --  143* 139*  GLUCOSE 122*  --   --  123* 125*   < > = values in this interval not displayed.       Imaging/Diagnostic Tests:  CXR: Diffuse interstitial opacities, with patchy ground-glass airspace disease, most consistent with multifocal  pneumonia.  Holley Bouche, MD 01/25/2021, 6:12 AM PGY-1, [place] Intern pager: [phone], text pages welcome

## 2021-01-26 DIAGNOSIS — E870 Hyperosmolality and hypernatremia: Secondary | ICD-10-CM

## 2021-01-26 LAB — COMPREHENSIVE METABOLIC PANEL
ALT: 172 U/L — ABNORMAL HIGH (ref 0–44)
ALT: 168 U/L — ABNORMAL HIGH (ref 0–44)
ALT: 139 U/L — ABNORMAL HIGH (ref 0–44)
ALT: 131 U/L — ABNORMAL HIGH (ref 0–44)
AST: 79 U/L — ABNORMAL HIGH (ref 15–41)
AST: 75 U/L — ABNORMAL HIGH (ref 15–41)
AST: 56 U/L — ABNORMAL HIGH (ref 15–41)
AST: 51 U/L — ABNORMAL HIGH (ref 15–41)
Albumin: 2.5 g/dL — ABNORMAL LOW (ref 3.5–5.0)
Albumin: 2.5 g/dL — ABNORMAL LOW (ref 3.5–5.0)
Albumin: 2.4 g/dL — ABNORMAL LOW (ref 3.5–5.0)
Albumin: 2.3 g/dL — ABNORMAL LOW (ref 3.5–5.0)
Alkaline Phosphatase: 59 U/L (ref 38–126)
Alkaline Phosphatase: 60 U/L (ref 38–126)
Alkaline Phosphatase: 59 U/L (ref 38–126)
Alkaline Phosphatase: 52 U/L (ref 38–126)
BUN: 60 mg/dL — ABNORMAL HIGH (ref 8–23)
BUN: 57 mg/dL — ABNORMAL HIGH (ref 8–23)
BUN: 50 mg/dL — ABNORMAL HIGH (ref 8–23)
BUN: 55 mg/dL — ABNORMAL HIGH (ref 8–23)
CO2: 21 mmol/L — ABNORMAL LOW (ref 22–32)
CO2: 22 mmol/L (ref 22–32)
CO2: 24 mmol/L (ref 22–32)
CO2: 23 mmol/L (ref 22–32)
Calcium: 7.9 mg/dL — ABNORMAL LOW (ref 8.9–10.3)
Calcium: 8 mg/dL — ABNORMAL LOW (ref 8.9–10.3)
Calcium: 7.8 mg/dL — ABNORMAL LOW (ref 8.9–10.3)
Calcium: 7.8 mg/dL — ABNORMAL LOW (ref 8.9–10.3)
Chloride: >130 mmol/L (ref 98–111)
Chloride: >130 mmol/L (ref 98–111)
Chloride: >130 mmol/L (ref 98–111)
Chloride: >130 mmol/L (ref 98–111)
Creatinine, Ser: 2.32 mg/dL — ABNORMAL HIGH (ref 0.44–1.00)
Creatinine, Ser: 2.36 mg/dL — ABNORMAL HIGH (ref 0.44–1.00)
Creatinine, Ser: 2.31 mg/dL — ABNORMAL HIGH (ref 0.44–1.00)
Creatinine, Ser: 2.48 mg/dL — ABNORMAL HIGH (ref 0.44–1.00)
GFR, Estimated: 21 mL/min — ABNORMAL LOW (ref >60)
GFR, Estimated: 20 mL/min — ABNORMAL LOW (ref >60)
GFR, Estimated: 21 mL/min — ABNORMAL LOW (ref >60)
GFR, Estimated: 19 mL/min — ABNORMAL LOW (ref >60)
Glucose, Bld: 102 mg/dL — ABNORMAL HIGH (ref 70–99)
Glucose, Bld: 97 mg/dL (ref 70–99)
Glucose, Bld: 98 mg/dL (ref 70–99)
Glucose, Bld: 136 mg/dL — ABNORMAL HIGH (ref 70–99)
Potassium: 3.8 mmol/L (ref 3.5–5.1)
Potassium: 3.7 mmol/L (ref 3.5–5.1)
Potassium: 3.4 mmol/L — ABNORMAL LOW (ref 3.5–5.1)
Potassium: 3.4 mmol/L — ABNORMAL LOW (ref 3.5–5.1)
Sodium: 162 mmol/L (ref 135–145)
Sodium: 164 mmol/L (ref 135–145)
Sodium: 163 mmol/L (ref 135–145)
Sodium: 162 mmol/L (ref 135–145)
Total Bilirubin: 1.1 mg/dL (ref 0.3–1.2)
Total Bilirubin: 1.1 mg/dL (ref 0.3–1.2)
Total Bilirubin: 1.1 mg/dL (ref 0.3–1.2)
Total Bilirubin: 0.7 mg/dL (ref 0.3–1.2)
Total Protein: 5.8 g/dL — ABNORMAL LOW (ref 6.5–8.1)
Total Protein: 6 g/dL — ABNORMAL LOW (ref 6.5–8.1)
Total Protein: 5.7 g/dL — ABNORMAL LOW (ref 6.5–8.1)
Total Protein: 5.7 g/dL — ABNORMAL LOW (ref 6.5–8.1)

## 2021-01-26 LAB — ABO/RH: ABO/RH(D): O POS

## 2021-01-26 LAB — GLUCOSE, CAPILLARY
Glucose-Capillary: 101 mg/dL — ABNORMAL HIGH (ref 70–99)
Glucose-Capillary: 95 mg/dL (ref 70–99)
Glucose-Capillary: 124 mg/dL — ABNORMAL HIGH (ref 70–99)

## 2021-01-26 LAB — CBC
HCT: 27.4 % — ABNORMAL LOW (ref 36.0–46.0)
Hemoglobin: 7.8 g/dL — ABNORMAL LOW (ref 12.0–15.0)
MCH: 28.2 pg (ref 26.0–34.0)
MCHC: 28.5 g/dL — ABNORMAL LOW (ref 30.0–36.0)
MCV: 98.9 fL (ref 80.0–100.0)
Platelets: 133 10*3/uL — ABNORMAL LOW (ref 150–400)
RBC: 2.77 MIL/uL — ABNORMAL LOW (ref 3.87–5.11)
RDW: 16.9 % — ABNORMAL HIGH (ref 11.5–15.5)
WBC: 10.8 10*3/uL — ABNORMAL HIGH (ref 4.0–10.5)
nRBC: 0.4 % — ABNORMAL HIGH (ref 0.0–0.2)

## 2021-01-26 LAB — HEMOGLOBIN AND HEMATOCRIT, BLOOD
HCT: 31.3 % — ABNORMAL LOW (ref 36.0–46.0)
Hemoglobin: 9.4 g/dL — ABNORMAL LOW (ref 12.0–15.0)

## 2021-01-26 LAB — TSH: TSH: 32.944 u[IU]/mL — ABNORMAL HIGH (ref 0.350–4.500)

## 2021-01-26 LAB — PREPARE RBC (CROSSMATCH)

## 2021-01-26 MED ORDER — SODIUM CHLORIDE 0.9% IV SOLUTION: Freq: Once | INTRAVENOUS | Status: AC

## 2021-01-26 MED ORDER — PROSOURCE TF PO LIQD
45.0000 mL | Freq: Every day | ORAL | Status: DC
  Administered 2021-01-26 – 2021-01-29 (×4): 45 mL
  Filled 2021-01-26 (×4): qty 45

## 2021-01-26 MED ORDER — DEXTROSE-NACL 5-0.45 % IV SOLN: INTRAVENOUS | Status: DC

## 2021-01-26 MED ORDER — OSMOLITE 1.2 CAL PO LIQD
1000.0000 mL | ORAL | Status: DC
  Administered 2021-01-26 – 2021-01-29 (×4): 1000 mL
  Filled 2021-01-26 (×6): qty 1000

## 2021-01-26 MED ORDER — POTASSIUM CHLORIDE 20 MEQ PO PACK
40.0000 meq | PACK | Freq: Once | ORAL | Status: AC
  Administered 2021-01-26: 40 meq via ORAL
  Filled 2021-01-26: qty 2

## 2021-01-26 MED ORDER — FREE WATER
300.0000 mL | Status: AC
  Administered 2021-01-26 (×3): 300 mL

## 2021-01-26 MED ORDER — METOPROLOL TARTRATE 25 MG PO TABS
25.0000 mg | ORAL_TABLET | Freq: Two times a day (BID) | ORAL | Status: DC
  Administered 2021-01-26 – 2021-01-29 (×7): 25 mg
  Filled 2021-01-26 (×7): qty 1

## 2021-01-26 NOTE — Consult Note (Signed)
   [facility] CM Inpatient Consult   01/26/2021  [name] [name] 07-30-1940 [id number]  [place] [facility] [[facility]] Patient: [employer]  Primary Care Provider:  Maryella Shivers, MD, [place] is listed for the [facility] follow up   Patient screened for hospitalization with noted less than 30 days readmission and to assess for potential [place] Management service needs for post hospital transition.  Review of patient's medical record reveals patient is from home with daughter.  Reviewed notes for transition of care needs.  Disposition is currently not determined.  Plan:  Continue to follow progress and disposition to assess for post hospital care management needs.    For questions contact:   Natividad Brood, RN BSN [facility] Liaison  [phone] business mobile phone Toll free office [phone]  Fax number: [phone] [email or web address]

## 2021-01-26 NOTE — Progress Notes (Signed)
Date and time results received: 01/26/21 1736 (use smartphrase ".now" to insert current time)  Test: CMP Critical Value: Na 163 Cl >130  Name of Provider Notified: Family medicine Teaching service via pager  Orders Received? Or Actions Taken?: None

## 2021-01-26 NOTE — Hospital Course (Addendum)
[name] [name] is a 80 y.o. female presenting with AMS in the context of multifocal PNA. PMH is significant for COPD, protein-calorie malnutrition with cahchexia, PEG tube in place, PVD, HLD, HTN, hypothyroidism. Her hospital course is detailed below.  Acute hypoxic respiratory failure in the context of multifocal PNA meeting sepsis criteria Patient presented meeting criteria for SIRS and sepsis with HR>90, WBC>12K, RR>20, and suspected source (CXR with multifocal PNA). Patient was started on 3L of oxygen through nasal canula. UA was unremarkable. Blood and urine cultures were collected and then Cefepime and Vancomycin were started on 9/11 for HAP. MRSA swab came back negative and Vancomycin was discontinued. Blood and urine cultures demonstrated no growth during admission. Cefepime was continued throughout remainder of hospitalization and she finished a 7-day course by discharge. She returned to her baseline oxygen status prior to discharge.   Hypovolemic Hypernatremia Na was 170 on admission (max elevation 173), likely 2/2 being volume down due to sepsis. Patient was rehydrated with IVF and free water through her PEG. Na was corrected by less than 10 meq over a 24 hour period, until Na normalized on 9/16. Patient also had hyperchloremia, Cl > 130, on admission. ABG demonstrated normal pH and pCO2 with no concern for acidosis. Chloride improved by discharge.  She was discharged with instructions to continue 600 mL of free water per PEG tube q6h.   AKI in the setting of CKD  Patients baseline creatinine is 1.3, but was 2.76 on admission. Etiology was likely pre-renal in the setting of dehydration with a BUN to Cr ratio greater than 20. Patients Cr was improved to 1.92 on day of discharge.   Goals of Care Patient going home with palliative. Consider ongoing discussion on goals of care.  All other issues chronic and stable.  Issues for follow up:  Consider repeat CMP to check electrolytes, LFTs, and  Cr.  Avoid nephrotoxic agents. TSH noted to be 32 during hospitalization, synthroid increased to 50. Recheck TSH in 4-6 weeks and titrate synthroid as needed Repeat albumin in 3-4 weeks to ensure patient is receiving adequate nutrition.

## 2021-01-26 NOTE — Progress Notes (Signed)
Date and time results received: 01/26/21 21:56 (use smartphrase ".now" to insert current time)  Test: Chloride Critical Value: >130  Name of Provider Notified: Family medicine  Orders Received? Or Actions Taken?:   No new orders. Continue treatment as planned.

## 2021-01-26 NOTE — Evaluation (Signed)
Clinical/Bedside Swallow Evaluation Patient Details  Name: [name] [name] MRN: [id number] Date of Birth: 03-26-1941  Today's Date: 01/26/2021 Time: SLP Start Time (ACUTE ONLY): 1914 SLP Stop Time (ACUTE ONLY): 0906 SLP Time Calculation (min) (ACUTE ONLY): 12 min  Past Medical History:  Past Medical History:  Diagnosis Date   COPD (chronic obstructive pulmonary disease) (HCC)    Depression    Exertional dyspnea    Frailty syndrome in geriatric patient 12/24/2020   HLD (hyperlipidemia)    HTN (hypertension)    Hypothyroidism    PEG (percutaneous endoscopic gastrostomy) status ([facility]) 12/24/2020   PVD (peripheral vascular disease) ([place])    occluded right subclavian artery. asymptomatic.    Past Surgical History:  Past Surgical History:  Procedure Laterality Date   hysterectomy - unknown type     IR GASTROSTOMY TUBE MOD SED  12/22/2020   THROAT SURGERY     HPI:  80 year old female presenting with sepsis due to pneumonia with severe electrolyte abnormalities including hyponatremia, hypochloremia, elevated creatinine/AKI, and elevated LFTs. Pt with hx of head and neck cancer and XRT with severe pharyngeal dysphagia, hx of aspiration PNA, and s/p PEG 12/2020.   Assessment / Plan / Recommendation Clinical Impression  Pt with hx of severe pharyngeal dysphagia, latent radiation associated, as evidenced from recent MBSS 12/2020 (prior admission s/p PEG at that time).  Pt very frail, cachectic, with aphonia, and weak congested cough. Performed diligent oral care, xerostomia noted as expected. Trialed single small ice chip, pt with delayed oral propulsion, minimal laryngeal elevation per palpation and post swallow coughing concerning for poor airway protection (clinical symptoms consistent with most recent objecitve swallow study findings: MBSS 12/2020). No further PO trials were administered due to severity of pts dysphagia. Pt with planned palliative care meeting 12 pm this date per chart review for  goals of care. Prognosis remains very guarded. Single ice chips with staff for comfort following diligent oral care remains reasonable with continued NPO and feedings via PEG. SLP will follow up for goals of care. SLP Visit Diagnosis: Dysphagia, pharyngoesophageal phase (R13.14)    Aspiration Risk  Severe aspiration risk;Risk for inadequate nutrition/hydration    Diet Recommendation   NPO  Medication Administration: Via alternative means    Other  Recommendations Oral Care Recommendations: Oral care QID;Oral care prior to ice chip/H20   Follow up Recommendations Other (comment) (TBD)      Frequency and Duration min 1 x/week  1 week       Prognosis Prognosis for Safe Diet Advancement: Guarded Barriers to Reach Goals: Severity of deficits      Swallow Study   General Date of Onset: 01/25/21 HPI: 80 year old female presenting with sepsis due to pneumonia with severe electrolyte abnormalities including hyponatremia, hypochloremia, elevated creatinine/AKI, and elevated LFTs. Pt with hx of head and neck cancer and XRT with severe pharyngeal dysphagia, hx of aspiration PNA, and s/p PEG 12/2020. Type of Study: Bedside Swallow Evaluation Previous Swallow Assessment: MBSS 12/16/2020; severe pharyngeal dysphagia with post swallow aspiration Diet Prior to this Study: NPO Temperature Spikes Noted: No Respiratory Status: Nasal cannula History of Recent Intubation: No Behavior/Cognition: Lethargic/Drowsy;Requires cueing;Cooperative Oral Cavity Assessment: Dry Oral Care Completed by SLP: Yes Vision: Functional for self-feeding Self-Feeding Abilities: Needs assist Patient Positioning: Upright in bed Baseline Vocal Quality: Low vocal intensity;Hoarse;Aphonic Volitional Cough: Congested;Weak Volitional Swallow: Unable to elicit    Oral/Motor/Sensory Function Overall Oral Motor/Sensory Function: Generalized oral weakness   Ice Chips Ice chips: Impaired Presentation: Spoon Oral Phase  Impairments: Reduced  lingual movement/coordination;Impaired mastication Oral Phase Functional Implications: Prolonged oral transit Pharyngeal Phase Impairments: Suspected delayed Swallow;Decreased hyoid-laryngeal movement;Multiple swallows;Wet Vocal Quality;Cough - Delayed   Thin Liquid Thin Liquid: Not tested    Nectar Thick Nectar Thick Liquid: Not tested   Honey Thick Honey Thick Liquid: Not tested   Puree Puree: Not tested   Solid     Solid: Not tested      Hayden Rasmussen MA, CCC-SLP Acute Rehabilitation Services   01/26/2021,9:22 AM

## 2021-01-26 NOTE — Progress Notes (Signed)
FPTS Interim Progress Note  Spoke with dietician, Estill Bamberg, regarding patient evaluation to start feedings via PEG tube. She will see the patient and place appropriate orders, appreciate assistance from nutrition.   Donney Dice, DO 01/26/2021, 3:27 PM PGY-2, [facility] Service pager [phone]

## 2021-01-26 NOTE — Progress Notes (Signed)
Initial Nutrition Assessment  DOCUMENTATION CODES:  Severe malnutrition in context of chronic illness  INTERVENTION:  Begin TF via PEG: -Osmolite 1.2 @ 33m/hr, advance by 1109mhr Q4H until goal rate of 55 ml/hr (1320 ml/day) is reached -ProSource TF 45 ml once daily -free water per MD, currently 30084m4H   Tube feeding regimen provides 1624 kcal, 84 grams of protein, and 1082 ml of H2O.    Total free water with flushes: 2882 ml  NUTRITION DIAGNOSIS:  Severe Malnutrition related to chronic illness (COPD) as evidenced by severe fat depletion, severe muscle depletion.  GOAL:  Patient will meet greater than or equal to 90% of their needs  MONITOR:  Labs, Weight trends, TF tolerance, I & O's  REASON FOR ASSESSMENT:  Consult Assessment of nutrition requirement/status  ASSESSMENT:  Pt with PMH significant for COPD, protein-calorie malnutrition w/ cachexia s/p PEG placement, PVD, HLD, HTN, and hypothyroidism admitted with AMS and acute hypoxic respiratory failure in the context of multifocal PNA meeting sepsis criteria.  Pt reports having had a poor appetite for "quite some time." PEG tube placed during previous hospital admission and pt was initiated on Osmolite 1.2 @ 45m50m with no tolerance issues. PMT met with pt and family/pt would like to continue with full scope of care. Discussed pt with MD and will place orders for TF to be initiated again via PEG per MD. Note pt currently hypernatremic, free water to be managed per MD.   Labs: Na 164 (H), BUN 57 (H), Cr 2.36 (H) Elevated LFTs Medications: free water 300ml70m IVF: D5-1/2 NS @ 50ml/86mNUTRITION - FOCUSED PHYSICAL EXAM: Flowsheet Row Most Recent Value  Orbital Region Severe depletion  Upper Arm Region Severe depletion  Thoracic and Lumbar Region Severe depletion  Buccal Region Severe depletion  Temple Region Severe depletion  Clavicle Bone Region Severe depletion  Clavicle and Acromion Bone Region Severe depletion   Scapular Bone Region Severe depletion  Dorsal Hand Severe depletion  Patellar Region Severe depletion  Anterior Thigh Region Severe depletion  Posterior Calf Region Severe depletion  Edema (RD Assessment) None  Hair Reviewed  Eyes Reviewed  Mouth Reviewed  Skin Reviewed  Nails Reviewed      Diet Order:   Diet Order             Diet NPO time specified Except for: Ice Chips  Diet effective now                  EDUCATION NEEDS:  No education needs have been identified at this time  Skin:  Skin Assessment: Reviewed RN Assessment  Last BM:  9/12  Height:  Ht Readings from Last 1 Encounters:  01/24/21 _0  (1.626 m)   Weight:  Wt Readings from Last 1 Encounters:  01/25/21 50.2 kg   BMI:  Body mass index is 19 kg/m.  Estimated Nutritional Needs:  Kcal:  1500-1700 Protein:  75-85 grams Fluid:  >1.5L    AmandaLarkin InaRD, LDN (she/her/hers) RD pager number and weekend/on-call pager number located in [facility]

## 2021-01-26 NOTE — Progress Notes (Signed)
Date and time results received: 01/26/21 21:56 (use smartphrase ".now" to insert current time)  Test: Sodium Critical Value: 162  Name of Provider Notified: Family medicine  Orders Received? Or Actions Taken?:  No new orders. Continue treatment as planned.

## 2021-01-26 NOTE — Progress Notes (Signed)
Unable to start 1 unit of RBC. Pt type and cross not resulted.

## 2021-01-26 NOTE — Progress Notes (Addendum)
Daily Progress Note   Patient Name: [name] [name]       Date: 01/26/2021 DOB: 1941-01-10  Age: 80 y.o. MRN#: [id number] Attending Physician: [name] Rio, MD Primary Care Physician: [name] Shivers, MD Admit Date: 01/24/2021  Reason for Consultation/Follow-up: Establishing goals of care  Patient Profile/HPI: 80 y.o. female  with past medical history of recurrent pnuemonia, esophageal cancer s/p radiation treatment with residual dysphagia, PEG tube in place, COPD (on 3L at home), PVD, hypothyroidism, HTN, HLD admitted on 01/24/2021 with altered mental status and acute on chronic respiratory failure in the setting of recurrent multifocal pneumonia, electrolyte disturbances likely due to dehydration, elevated transaminases likely due to hypotension (she was 64 systolic by EMS- has been fluid resuscitated), acute on chronic kidney disease. Palliative medicine consulted for "DNR; patient with severe hypernatremia, hyperchloremia, AKI, as well as new multifocal pneumonia. Will require admission."    Subjective: [name] is sitting up awake and alert. She is not able to tell me about her condition and does not recall my discussion with her yesterday about her pneumonia. She is not able to tell me about her feeding tube, cannot tell me what it is for. She is not able to participate in goals of care discussion at this time. She requests ice chips- which she sucks on and spits out without swallowing for comfort. Met with patient's daughter [name] [name] and son in law [name] [name] outside of the room.  After discharge from [name] [name] was staying with [name] [name] at night and then [name] [name] would bring [name] to [name] [name]'s home and allow her to spend time there where she was more comfortable and in a familiar  setting. She was using a bedside commode, ambulating with a walker but required additional support from [name] [name] as well when ambulating.  We discussed [name] [name]'s values and goals- [name] [name] has always been very independent. She was  single Mom and [name] [name] was an only child. [name] [name] shares it has been difficult on Layann to be dependent for care. [name] [name] notes that her Mom has stated she would not want to live very debilitated and would not want to cause a burden on [name] [name].  I discussed the trajectory of dysphagia and recurrent aspiration pneumonia, with ongoing decline and repeat hospitalizations.  We discussed the need to consider what [name] [name]'s bottom line would be regarding ongoing life prolonging  care in the context of her definition of quality of life.  Options of continued aggressive care with labs, IV fluids, antibiotics vs treating what is treatable and going home with Hospice without plans for future hosptialization vs transition to full comfort measures only now and seeking inpatient Hospice were presented.  [name] [name] shared his concerns about the strain on [name] [name] taking care of patient in Hospice care at home- she has multiple responsibilities.  At close of our discussion [name] [name] noted that she needed time to consider options, and discuss with other family members.   Review of Systems  Unable to perform ROS: Mental status change    Physical Exam Vitals and nursing note reviewed.  HENT:     Mouth/Throat:     Mouth: Mucous membranes are dry.     Comments: hypophonic Neurological:     Mental Status: She is alert.     Motor: Weakness present.  Psychiatric:     Comments: Impaired memory and insight            Vital Signs: BP (!) 135/51 (BP Location: Left Arm)   Pulse 96   Temp 99.3 F (37.4 C) (Oral)   Resp 20   Ht 5' 4"  (1.626 m)   Wt 50.2 kg   SpO2 94%   BMI 19.00 kg/m  SpO2: SpO2: 94 % O2 Device: O2 Device: Nasal Cannula O2 Flow Rate: O2 Flow Rate (L/min): 3  L/min  Intake/output summary:  Intake/Output Summary (Last 24 hours) at 01/26/2021 1205 Last data filed at 01/25/2021 2111 Gross per 24 hour  Intake 464.23 ml  Output --  Net 464.23 ml   LBM: Last BM Date: 01/25/21 Baseline Weight: Weight: 43.1 kg Most recent weight: Weight: 50.2 kg       Palliative Assessment/Data: PPS: 30%      Patient Active Problem List   Diagnosis Date Noted  . Healthcare-associated pneumonia   . Underweight 12/24/2020  . Frailty syndrome in geriatric patient 12/24/2020  . Emphysema lung ([facility]) 12/24/2020  . PEG (percutaneous endoscopic gastrostomy) status ([facility]) 12/24/2020  . Dysphagia, pharyngoesophageal phase   . Sepsis ([place])   . Protein-calorie malnutrition, severe 12/16/2020  . Aspiration pneumonia ([place]) 12/15/2020  . Weight loss, non-intentional 12/15/2020  . Cachexia ([place]) 12/15/2020  . Carotid artery disease ([place]) 08/13/2019  . PVD (peripheral vascular disease) ([place])   . HTN (hypertension)   . ATHEROSLERO NATV ART EXTREM W/INTERMIT CLAUDICAT 12/22/2009    Palliative Care Assessment & Plan    Assessment/Recommendations/Plan  Continue current full scope care [name] [name] is considering options of transition to comfort and hospice vs continued care   Code Status: DNR  Prognosis:  Unable to determine  Discharge Planning: To Be Determined  Care plan was discussed with patient's daughter.  Thank you for allowing the Palliative Medicine Team to assist in the care of this patient.  Total time: 48 mins Prolonged billing:      Greater than 50%  of this time was spent counseling and coordinating care related to the above assessment and plan.  [name] [name], [name] [name] Palliative [facility]   Please contact Palliative Medicine Team phone at [phone] for questions and concerns.

## 2021-01-26 NOTE — Progress Notes (Signed)
FPTS Interim Progress Note  S: No complaints.  O: BP (!) 138/59 (BP Location: Left Arm)   Pulse 87   Temp 98 F (36.7 C) (Oral)   Resp 20   Ht 5\' 4"  (1.626 m)   Wt 50.2 kg   SpO2 96%   BMI 19.00 kg/m    General: Frail elderly female lying in bed. Heart: Regular rate and rhythm with no murmurs appreciated Lungs: CTA bilaterally, no wheezing Extremities: No lower extremity edema   A/P: Continuing to trend sodium level with every 4 hours CMP's.  Most recent sodium of 163.  Plan to reduce sodium no more than 10 mmol/L daily.  Patient is also continuing to work with palliative to ensure that her goals of care are being respected.  Hemoglobin appropriate after receiving 1 unit packed RBCs, patient with no complaints of bleeding and the need for transfusion was likely due to dilution due to the amount of fluid she is received.  Lurline Del, DO 01/26/2021, 8:08 PM PGY-3, [facility] Medicine Service pager [phone]

## 2021-01-26 NOTE — Plan of Care (Signed)

## 2021-01-26 NOTE — Progress Notes (Addendum)
Family Medicine Teaching Service Daily Progress Note Intern Pager: [phone]  Patient name: [name] [name] Medical record number: [id number] Date of birth: 1940-10-20 Age: 80 y.o. Gender: female  Primary Care Provider: Maryella Shivers, MD Consultants: Palliative Code Status: DNR/DNI which was confirmed with the daughter/caretaker  Pt Overview and Major Events to Date:  -Patient admitted overnight - 01/24/21  Assessment and Plan:  Laryn [name] [name] is a 80 y.o. female presenting with AMS in the context of multifocal PNA. PMH is significant for COPD, protein-calorie malnutrition with cahchexia, PEG tube in place, PVD, HLD, HTN, hypothyroidism.  AMS (resolved)  Acute hypoxic respiratory failure in the context of multifocal PNA meeting sepsis criteria Patient alert and oriented to person and place, resting in bed, she reports no discomfort or distress. Lungs CTABL with normal work of breathing. Pulses present in all extremities.  -Continue Cefepime for HAP, 7 days (9/11-9/18) -F/u Bcx, Ucx: NGTD -Strict I&O's -Daily weights -Up with assistance -Continuous pulse ox -Continuous cardiac monitoring  Goals of Care: -Palliative family meeting this afternoon  Hypovolemic Hypernatremia Most recent sodium was 164 at 0500 [162 @ 0122]. Will try to keep correction less than 10 meq over the next 24 hrs.  -D5 1/2 NS 50 ml/hr -Free water 300 cc -Q4hr CMPs -We will adjust fluid level to keep correction less than 91meq over 24 hours as stated above   Hyperchloremia chloride level read as >130,  This is often seen acidotic states, however patient's bicarb level is normal and she does not have lactic acidosis at this time. Likely high in the setting of fluid resuscitation.   Elevated LFTs -Today AST / ALT: 79/ 172, [139/244] a day ago -IV fluids as above -Continue to trend LFTs,   COPD Requires O2 at night at baseline, currently on 3 L nasal cannula.  -Continue Ventolin and  Spiriva  Protein-calorie malnutrition Patient with cachexia, albumin of 3.0, PEG tube in place from hospitalization in August.  -Nutrition consult, appreciate recs  AKI on CKD Baseline creatinine of 1.3, was 2.76 on admission. Likely pre-renal due to dehydration with BUN to creatinine ratio of greater than 20 and given hypernatremia.  -Cr: 2.32 [2.76 on admission] -CMPs as above, monitor Cr, expect improvement if pre-renal etiology, consider further workup if does not improve  Prolonged QT interval 1st EKG with QTcB read as 556, T-waves difficult to appreciate on manual read, may not be accurate. -Repeat EKG today showed Qtc 410 -Avoid QTC prolonging agents  HTN Some documented low pressures in the ED, held home medications. Now hypertensive to 159/66. Home medications include amlodipine and metoprolol tartrate -Hold home amlodipine 5 mg (historical med) -Restart home metoprolol 25 mg BID  Hypothyroidism Home medication Synthroid 25 mcg daily.  Was previously on 125 mcg daily per daughter.  Appears to have recently been decreased recently due to TSH of 0.018 (12/15/2020). -Check TSH -Continue home synthroid 25 mcg  HLD -Continue home rosuvastatin 40 mg  Anemia  Hgb of 9.2 on admission, s/p 1 unit prbc -Hgb 7.8 today, transfusion goal < 8.0 -Follow up Hgb/HCT   Constipation Pt reports lack of Bm for several days, slight ABD discomfort without acute surgical findings. Consider Miralax    FEN/GI: NPO except for ice chips PPx: Lovenox Dispo: Possibly Comfort care  today. Barriers include Palliative care family meeting.   Subjective:  No acute events overnight, patient had a cup of ice chips and requested more this morning. She is in no acute distress breathing comfortably on 3L [place].  Objective: Temp:  [98 F (36.7 C)] 98 F (36.7 C) (09/13 0403) Pulse Rate:  [36-102] 92 (09/13 0403) Resp:  [16-29] 20 (09/13 0403) BP: (83-161)/(53-102) 128/54 (09/13 0403) SpO2:  [90 %-100  %] 91 % (09/13 0403) Weight:  [50.2 kg] 50.2 kg (09/12 2243) Physical Exam: General: Frail, cachectic, elderly woman, resting in bed, alert and oriented to person and place. Cardiovascular: RRR, NRMG Respiratory: CTABL, no wheezes or stridor Abdomen: Soft, non-tender, non-distended Extremities: Pulses intact on all extremities  Laboratory: Recent Labs  Lab 01/24/21 1629 01/24/21 2205 01/24/21 2311 01/25/21 0524 01/26/21 0122  WBC 13.1*  --   --  13.1* 10.8*  HGB 9.2*   < > 7.5* 8.1* 7.8*  HCT 33.1*   < > 22.0* 29.6* 27.4*  PLT 144*  --   --  128* 133*   < > = values in this interval not displayed.   Recent Labs  Lab 01/25/21 1655 01/25/21 2048 01/26/21 0122  NA 164* 163* 162*  K 4.0 3.7 3.8  CL >130* 128* >130*  CO2 23 24 21*  BUN 62* 60* 60*  CREATININE 2.39* 2.38* 2.32*  CALCIUM 8.2* 7.9* 7.9*  PROT 6.3* 6.1* 5.8*  BILITOT 1.1 0.9 1.1  ALKPHOS 59 60 59  ALT 210* 195* 172*  AST 113* 96* 79*  GLUCOSE 98 118* 102*      Imaging/Diagnostic Tests: None  Holley Bouche, MD 01/26/2021, 5:52 AM PGY-1, [facility] Intern pager: [phone], text pages welcome

## 2021-01-27 ENCOUNTER — Inpatient Hospital Stay (HOSPITAL_COMMUNITY): Payer: Medicare Other

## 2021-01-27 DIAGNOSIS — Z7189 Other specified counseling: Secondary | ICD-10-CM | POA: Diagnosis not present

## 2021-01-27 DIAGNOSIS — J189 Pneumonia, unspecified organism: Secondary | ICD-10-CM | POA: Diagnosis not present

## 2021-01-27 DIAGNOSIS — R0603 Acute respiratory distress: Secondary | ICD-10-CM | POA: Diagnosis not present

## 2021-01-27 DIAGNOSIS — R1310 Dysphagia, unspecified: Secondary | ICD-10-CM

## 2021-01-27 DIAGNOSIS — N179 Acute kidney failure, unspecified: Secondary | ICD-10-CM | POA: Diagnosis not present

## 2021-01-27 LAB — TYPE AND SCREEN
ABO/RH(D): O POS
Antibody Screen: NEGATIVE
Unit division: 0

## 2021-01-27 LAB — BASIC METABOLIC PANEL
Anion gap: 7 (ref 5–15)
Anion gap: 7 (ref 5–15)
BUN: 53 mg/dL — ABNORMAL HIGH (ref 8–23)
BUN: 50 mg/dL — ABNORMAL HIGH (ref 8–23)
CO2: 22 mmol/L (ref 22–32)
CO2: 23 mmol/L (ref 22–32)
Calcium: 7.6 mg/dL — ABNORMAL LOW (ref 8.9–10.3)
Calcium: 7.8 mg/dL — ABNORMAL LOW (ref 8.9–10.3)
Chloride: 127 mmol/L — ABNORMAL HIGH (ref 98–111)
Chloride: 126 mmol/L — ABNORMAL HIGH (ref 98–111)
Creatinine, Ser: 2.21 mg/dL — ABNORMAL HIGH (ref 0.44–1.00)
Creatinine, Ser: 2.22 mg/dL — ABNORMAL HIGH (ref 0.44–1.00)
GFR, Estimated: 22 mL/min — ABNORMAL LOW (ref >60)
GFR, Estimated: 22 mL/min — ABNORMAL LOW (ref >60)
Glucose, Bld: 138 mg/dL — ABNORMAL HIGH (ref 70–99)
Glucose, Bld: 133 mg/dL — ABNORMAL HIGH (ref 70–99)
Potassium: 4.3 mmol/L (ref 3.5–5.1)
Potassium: 4.1 mmol/L (ref 3.5–5.1)
Sodium: 156 mmol/L — ABNORMAL HIGH (ref 135–145)
Sodium: 156 mmol/L — ABNORMAL HIGH (ref 135–145)

## 2021-01-27 LAB — COMPREHENSIVE METABOLIC PANEL
ALT: 124 U/L — ABNORMAL HIGH (ref 0–44)
ALT: 116 U/L — ABNORMAL HIGH (ref 0–44)
AST: 60 U/L — ABNORMAL HIGH (ref 15–41)
AST: 53 U/L — ABNORMAL HIGH (ref 15–41)
Albumin: 2.2 g/dL — ABNORMAL LOW (ref 3.5–5.0)
Albumin: 2.1 g/dL — ABNORMAL LOW (ref 3.5–5.0)
Alkaline Phosphatase: 48 U/L (ref 38–126)
Alkaline Phosphatase: 54 U/L (ref 38–126)
Anion gap: 12 (ref 5–15)
BUN: 53 mg/dL — ABNORMAL HIGH (ref 8–23)
BUN: 52 mg/dL — ABNORMAL HIGH (ref 8–23)
CO2: 23 mmol/L (ref 22–32)
CO2: 23 mmol/L (ref 22–32)
Calcium: 7.6 mg/dL — ABNORMAL LOW (ref 8.9–10.3)
Calcium: 8 mg/dL — ABNORMAL LOW (ref 8.9–10.3)
Chloride: >130 mmol/L (ref 98–111)
Chloride: 125 mmol/L — ABNORMAL HIGH (ref 98–111)
Creatinine, Ser: 2.39 mg/dL — ABNORMAL HIGH (ref 0.44–1.00)
Creatinine, Ser: 2.28 mg/dL — ABNORMAL HIGH (ref 0.44–1.00)
GFR, Estimated: 20 mL/min — ABNORMAL LOW (ref >60)
GFR, Estimated: 21 mL/min — ABNORMAL LOW (ref >60)
Glucose, Bld: 136 mg/dL — ABNORMAL HIGH (ref 70–99)
Glucose, Bld: 160 mg/dL — ABNORMAL HIGH (ref 70–99)
Potassium: 4.2 mmol/L (ref 3.5–5.1)
Potassium: 3.3 mmol/L — ABNORMAL LOW (ref 3.5–5.1)
Sodium: 157 mmol/L — ABNORMAL HIGH (ref 135–145)
Sodium: 160 mmol/L — ABNORMAL HIGH (ref 135–145)
Total Bilirubin: 0.7 mg/dL (ref 0.3–1.2)
Total Bilirubin: 0.8 mg/dL (ref 0.3–1.2)
Total Protein: 5.4 g/dL — ABNORMAL LOW (ref 6.5–8.1)
Total Protein: 5.5 g/dL — ABNORMAL LOW (ref 6.5–8.1)

## 2021-01-27 LAB — GLUCOSE, CAPILLARY
Glucose-Capillary: 150 mg/dL — ABNORMAL HIGH (ref 70–99)
Glucose-Capillary: 145 mg/dL — ABNORMAL HIGH (ref 70–99)
Glucose-Capillary: 152 mg/dL — ABNORMAL HIGH (ref 70–99)
Glucose-Capillary: 118 mg/dL — ABNORMAL HIGH (ref 70–99)

## 2021-01-27 LAB — CBC
HCT: 33.7 % — ABNORMAL LOW (ref 36.0–46.0)
Hemoglobin: 9.9 g/dL — ABNORMAL LOW (ref 12.0–15.0)
MCH: 26.5 pg (ref 26.0–34.0)
MCHC: 29.4 g/dL — ABNORMAL LOW (ref 30.0–36.0)
MCV: 90.1 fL (ref 80.0–100.0)
Platelets: 163 10*3/uL (ref 150–400)
RBC: 3.74 MIL/uL — ABNORMAL LOW (ref 3.87–5.11)
RDW: 21.7 % — ABNORMAL HIGH (ref 11.5–15.5)
WBC: 7.7 10*3/uL (ref 4.0–10.5)
nRBC: 0.3 % — ABNORMAL HIGH (ref 0.0–0.2)

## 2021-01-27 LAB — ECHOCARDIOGRAM COMPLETE
Area-P 1/2: 3.68 cm2
Height: 64 in
S' Lateral: 3.25 cm
Weight: 1770.73 oz

## 2021-01-27 LAB — BPAM RBC
Blood Product Expiration Date: 202210092359
ISSUE DATE / TIME: 202209131204
Unit Type and Rh: 5100

## 2021-01-27 LAB — MAGNESIUM: Magnesium: 3.1 mg/dL — ABNORMAL HIGH (ref 1.7–2.4)

## 2021-01-27 MED ORDER — POLYETHYLENE GLYCOL 3350 17 G PO PACK
17.0000 g | PACK | Freq: Every day | ORAL | Status: DC
  Administered 2021-01-27: 17 g via ORAL
  Filled 2021-01-27: qty 1

## 2021-01-27 MED ORDER — LEVOTHYROXINE SODIUM 50 MCG PO TABS
50.0000 ug | ORAL_TABLET | Freq: Every day | ORAL | Status: DC
  Administered 2021-01-28 – 2021-01-31 (×4): 50 ug
  Filled 2021-01-27 (×4): qty 1

## 2021-01-27 MED ORDER — FREE WATER
300.0000 mL | Status: DC
  Administered 2021-01-27: 300 mL

## 2021-01-27 MED ORDER — POTASSIUM CHLORIDE 20 MEQ PO PACK
40.0000 meq | PACK | Freq: Once | ORAL | Status: AC
  Administered 2021-01-27: 40 meq
  Filled 2021-01-27: qty 2

## 2021-01-27 MED ORDER — POLYETHYLENE GLYCOL 3350 17 G PO PACK
17.0000 g | PACK | Freq: Every day | ORAL | Status: DC
  Administered 2021-01-28 – 2021-01-29 (×2): 17 g
  Filled 2021-01-27 (×2): qty 1

## 2021-01-27 MED ORDER — FREE WATER
300.0000 mL | Status: AC
  Administered 2021-01-27 – 2021-01-29 (×12): 300 mL

## 2021-01-27 MED ORDER — SODIUM CHLORIDE 0.45 % IV SOLN: INTRAVENOUS | Status: DC

## 2021-01-27 NOTE — Progress Notes (Signed)
Family Medicine Teaching Service Daily Progress Note Intern Pager: [phone]  Patient name: [name] [name] Medical record number: [id number] Date of birth: [birth year] Age: 80 y.o. Gender: female  Primary Care Provider: Maryella Shivers, MD Consultants: Palliative, Dietician Code Status:  DNR/DNI which was confirmed with the daughter/caretaker    Pt Overview and Major Events to Date:  -Patient admitted overnight - 01/24/21  Assessment and Plan: [name] [name] is a 80 y.o. female presenting with AMS in the context of multifocal PNA. PMH is significant for COPD, protein-calorie malnutrition with cahchexia, PEG tube in place, PVD, HLD, HTN, hypothyroidism.  AMS (resolved)  Acute hypoxic respiratory failure in the context of multifocal PNA meeting sepsis criteria -Continue Cefepime for HAP, 7 days (9/11-9/18) -F/u Bcx, Ucx: NGTD -On 3L [place] -Strict I&O's -Daily weights -Up with assistance -Continuous pulse ox -Continuous cardiac monitoring  Goals of Care -Palliative family meeting yesterday afternoon, family wants time to decide and speak with other family members  Hypovolemic Hypernatremia Most recent sodium was 160 at 0545 [164 @ 0466 9/13]. Will try to keep correction less than 10 meq over the next 24 hrs. Patient mucous membranes dry on exam, likely has room for increased fluid support. K 3.4 overnight, and was repleted -Order an echo to evaluate ability to manage volume status -1/2 NS 75 ml/hr no longer on D5 @ 50 mL/hr -Free water 300 cc daily  -Q 6 hr BMPs instead of CMP q 4 -80 mEq K given  -We will adjust fluid level to keep correction less than 8meq over 24 hours as stated above  Hyperchloremia Chloride level 125, CO2 23 today. This is often seen in acidotic states, however patient's bicarb level is normal and she does not have lactic acidosis at this time. Likely high in the setting of fluid resuscitation. Will continue to trend chloride.  Elevated LFTs -Today AST /  ALT: 53/116, [75/168] a day ago -IV fluids as above -Continue to trend LFTs daily  COPD Requires O2 at night at baseline, currently on 3 L nasal cannula.  -Continue Ventolin and Spiriva  Protein-calorie malnutrition Patient with cachexia, albumin of 2.1 today, PEG tube in place from hospitalization in August.  -Nutrition consult, appreciate recs -Osmolite 1.2 @ 39ml/hr, advance by 63ml/hr Q4H until goal rate of 55 ml/hr (1320 ml/day) is reached -ProSource TF 45 ml once daily -SLP following  AKI on CKD Baseline creatinine of 1.3, was 2.76 on admission. Likely pre-renal due to dehydration with BUN to creatinine ratio of greater than 20 and given hypernatremia. Monitor Cr, expect improvement if pre-renal etiology, consider further workup if does not improve -Cr 2.28, BUN 52 -BMPs as above  Prolonged QT interval 1st EKG with QTcB read as 556, T-waves difficult to appreciate on manual read, repeat EKG showed Qtc 410 -Avoid QTC prolonging agents  HTN BP stable, ranging from 470/96-283/66 with low diastolic's Home medications include amlodipine and metoprolol tartrate -Hold home amlodipine 5 mg (historical med) -Continue home metoprolol 25 mg BID  Hypothyroidism Home medication Synthroid 25 mcg daily.  Was previously on 125 mcg daily. Appears to have recently been decreased recently due to TSH of 0.018 (12/15/2020). -TSH 32.944 [0.35-4.50], go up on synthroid 50 -Recheck TSH in 4 weeks  HLD -Continue home rosuvastatin 40 mg  Anemia Hgb of 9.2 on admission, s/p 1 unit prbc yesterday -Hgb today 9.4 today, HCT 31.3   Constipation Pt reports lack of bowel movement for several days, No abd discomfort, without acute surgical findings.  -Started Miralax  FEN/GI: Tube Feeds PPx: Lovenox Dispo: Possibly comfort care   Pending family decision . Barriers include Family decision.   Subjective:  No acute signs of distress, patient request ice chips this morning.   Objective: Temp:   [97.8 F (36.6 C)-99.3 F (37.4 C)] 98.2 F (36.8 C) (09/14 0400) Pulse Rate:  [72-100] 93 (09/14 0400) Resp:  [15-23] 19 (09/14 0400) BP: (123-148)/(49-78) 123/51 (09/14 0400) SpO2:  [91 %-98 %] 95 % (09/14 0400) Physical Exam: General: Ill appearing, frail, alert and oriented to person and place Cardiovascular: RRR, [place] Respiratory: CTABL, normal work of breathing, no wheezes or stridor Abdomen: Soft, non-tender, non-distended Extremities: Pulses intact in all extremities  Laboratory: Recent Labs  Lab 01/24/21 1629 01/24/21 2205 01/25/21 0524 01/26/21 0122 01/26/21 1656  WBC 13.1*  --  13.1* 10.8*  --   HGB 9.2*   < > 8.1* 7.8* 9.4*  HCT 33.1*   < > 29.6* 27.4* 31.3*  PLT 144*  --  128* 133*  --    < > = values in this interval not displayed.   Recent Labs  Lab 01/26/21 1656 01/26/21 2031 01/27/21 0231  NA 163* 162* 157*  K 3.4* 3.4* 4.2  CL >130* >130* >130*  CO2 24 23 23   BUN 50* 55* 53*  CREATININE 2.31* 2.48* 2.39*  CALCIUM 7.8* 7.8* 7.6*  PROT 5.7* 5.7* 5.4*  BILITOT 1.1 0.7 0.7  ALKPHOS 59 52 48  ALT 139* 131* 124*  AST 56* 51* 60*  GLUCOSE 98 136* 136*     Imaging/Diagnostic Tests: None  Holley Bouche, MD 01/27/2021, 6:15 AM PGY-1, [place] Intern pager: [phone], text pages welcome

## 2021-01-27 NOTE — Progress Notes (Signed)
Date and time results received: 01/27/21 04:01 (use smartphrase ".now" to insert current time)  Test: chloride Critical Value: >130  Name of Provider Notified: Family Medicine  Orders Received? Or Actions Taken?:  No new orders. Continue current treatment/care plan

## 2021-01-27 NOTE — Progress Notes (Signed)
Daily Progress Note   Patient Name: [name] [name]       Date: 01/27/2021 DOB: April 05, 1941  Age: 80 y.o. MRN#: [id number] Attending Physician: [name] Rio, MD Primary Care Physician: [name] Shivers, MD Admit Date: 01/24/2021  Reason for Consultation/Follow-up: Establishing goals of care  Patient Profile/HPI: 80 y.o. female  with past medical history of recurrent pnuemonia, esophageal cancer s/p radiation treatment with residual dysphagia, PEG tube in place, COPD (on 3L at home), PVD, hypothyroidism, HTN, HLD admitted on 01/24/2021 with altered mental status and acute on chronic respiratory failure in the setting of recurrent multifocal pneumonia, electrolyte disturbances likely due to dehydration, elevated transaminases likely due to hypotension (she was 64 systolic by EMS- has been fluid resuscitated), acute on chronic kidney disease. Palliative medicine consulted for "DNR; patient with severe hypernatremia, hyperchloremia, AKI, as well as new multifocal pneumonia. Will require admission."    Subjective:  [name] [name] is about the same today. She has no complaints.  I called [name] [name] for followup. She had no questions.  She will call when family has made decisions about plan of care.   Review of Systems  Unable to perform ROS: Mental status change    Physical Exam Vitals and nursing note reviewed.  Neurological:     Mental Status: She is alert. She is disoriented.     Motor: Weakness present.  Psychiatric:     Comments: Impaired memory and insight            Vital Signs: BP (!) 152/57   Pulse 84   Temp 98.2 F (36.8 C) (Oral)   Resp (!) 21   Ht 5\' 4"  (1.626 m)   Wt 50.2 kg   SpO2 98%   BMI 19.00 kg/m  SpO2: SpO2: 98 % O2 Device: O2 Device: Nasal Cannula O2 Flow Rate:  O2 Flow Rate (L/min): 3 L/min  Intake/output summary:  Intake/Output Summary (Last 24 hours) at 01/27/2021 1317 Last data filed at 01/27/2021 0100 Gross per 24 hour  Intake 807.42 ml  Output --  Net 807.42 ml    LBM: Last BM Date: 01/25/21 Baseline Weight: Weight: 43.1 kg Most recent weight: Weight: 50.2 kg       Palliative Assessment/Data: PPS: 30%      Patient Active Problem List   Diagnosis Date Noted  . Hypernatremia   .  Healthcare-associated pneumonia   . Underweight 12/24/2020  . Frailty syndrome in geriatric patient 12/24/2020  . Emphysema lung ([place]) 12/24/2020  . PEG (percutaneous endoscopic gastrostomy) status ([place]) 12/24/2020  . Dysphagia, pharyngoesophageal phase   . Sepsis ([place])   . Protein-calorie malnutrition, severe 12/16/2020  . Aspiration pneumonia ([place]) 12/15/2020  . Weight loss, non-intentional 12/15/2020  . Cachexia ([place]) 12/15/2020  . Carotid artery disease ([place]) 08/13/2019  . PVD (peripheral vascular disease) ([place])   . HTN (hypertension)   . ATHEROSLERO NATV ART EXTREM W/INTERMIT CLAUDICAT 12/22/2009    Palliative Care Assessment & Plan    Assessment/Recommendations/Plan  Continue current full scope care [name] [name] is considering options of transition to comfort and hospice vs continued care- she stated she will call when any decisions are made or if she has questions PMT will shadow and await call   Code Status: DNR  Prognosis:  Unable to determine  Discharge Planning: To Be Determined  Care plan was discussed with patient's daughter.  Thank you for allowing the Palliative Medicine Team to assist in the care of this patient.  Total time: 26 mins Prolonged billing: N      Greater than 50%  of this time was spent counseling and coordinating care related to the above assessment and plan.  [name] [name], AGNP-C Palliative Medicine   Please contact Palliative Medicine Team phone at [phone] for questions and concerns.

## 2021-01-27 NOTE — Progress Notes (Addendum)
Pharmacy Antibiotic Note  [name] [name] is a 80 y.o. female admitted on 01/24/2021 with  HAP .  Pharmacy has been consulted for cefepime dosing.  Patient's CrCl is 15 mL/min   Plan: Cefepime 2g q24h 7-day course. Dose appropriate based on CrCl.  Height: 5\' 4"  (162.6 cm) Weight: 50.2 kg (110 lb 10.7 oz) IBW/kg (Calculated) : 54.7  Temp (24hrs), Avg:98.2 F (36.8 C), Min:98 F (36.7 C), Max:99.3 F (37.4 C)  Recent Labs  Lab 01/24/21 1629 01/24/21 2048 01/25/21 0524 01/25/21 0849 01/26/21 0122 01/26/21 0446 01/26/21 1656 01/26/21 2031 01/27/21 0231 01/27/21 0545  WBC 13.1*  --  13.1*  --  10.8*  --   --   --   --   --   CREATININE 2.76*   < >  --    < > 2.32* 2.36* 2.31* 2.48* 2.39* 2.28*  LATICACIDVEN 1.9  --   --   --   --   --   --   --   --   --    < > = values in this interval not displayed.     Estimated Creatinine Clearance: 15.3 mL/min (A) (by C-G formula based on SCr of 2.28 mg/dL (H)).    No Known Allergies  Antimicrobials this admission: cefepime 9/11 >> 9/17 vancomycin 9/11  Microbiology results: MRSA PCR negative Blood cx 9/11 ngtd Urine cx 9/11 ngtd  Thank you for allowing pharmacy to be a part of this patient's care.  Michela Pitcher) [place], PharmD Student

## 2021-01-27 NOTE — Progress Notes (Signed)
  Speech Language Pathology Treatment: Dysphagia  Patient Details Name: [name] [name] MRN: [id number] DOB: 02-Nov-1940 Today's Date: 01/27/2021 Time: 6226-3335 SLP Time Calculation (min) (ACUTE ONLY): 17 min  Assessment / Plan / Recommendation Clinical Impression  Pt was seen for dysphagia treatment. She was alert and cooperative during the session. Vocal quality was breathy and vocal intensity reduced. Pt reported that she has been content swallowing ice chips and periodically spitting them out. Pt demonstrated congested coughing with ice chips. Hyolaryngeal elevation subjectively appeared reduced upon palpation. Pt was educated regarding completion of effortful swallows and she was able to demonstrate these with cues. Pt was educated regarding signs of aspiration and the potential pulmonary complications as well as the need for frequent oral care. She verbalized understanding. Palliative care is following for ongoing discussions regarding [place]. SLP will continue to follow pt.    HPI HPI: Pt is an 80 year old female who presented with sepsis due to pneumonia with severe electrolyte abnormalities including hyponatremia, hypochloremia, elevated creatinine/AKI, and elevated LFTs. CXR on admission 9/11: Diffuse interstitial opacities, with patchy ground-glass airspace  disease, most consistent with multifocal pneumonia.Pt was admitted 7/2-7/4 for PNA and was admitted in August with severe sepsis from suspected aspiration PNA. PMT 9/13: continue full scope. PMH: throat ca s/p XRT and surgery, COPD, exertional dyspnea, HLD, HTN, hypothyroidism, PEG 12/2020, PVD. MBS 12/16/20: severe pharyngeal dysphagia with minimal entrance into the cervical esophagus, significantly impaired mobility throughout her pharynx and larynx, with changes that could be consistent with post-radiation dysphagia. Little to no movement noted despite cues to swallow as effortfully as possible. Aspiration was noted with all trials and and NPO  status recommended with allowance of single ice chips.      SLP Plan  Continue with current plan of care       Recommendations  Diet recommendations: NPO (ice chips allowed after oral care) Liquids provided via: Teaspoon Medication Administration: Via alternative means Compensations: Slow rate Postural Changes and/or Swallow Maneuvers: Seated upright 90 degrees                Oral Care Recommendations: Oral care QID;Oral care prior to ice chip/H20 Follow up Recommendations: Other (comment) (TBD) SLP Visit Diagnosis: Dysphagia, pharyngoesophageal phase (R13.14) Plan: Continue with current plan of care       Lynzy Rawles I. Hardin Negus, [place], [place] Office number [phone] Pager [place] 01/27/2021, 10:22 AM

## 2021-01-27 NOTE — Progress Notes (Signed)
  Echocardiogram 2D Echocardiogram has been performed.  [name] [name] M 01/27/2021, 3:08 PM

## 2021-01-27 NOTE — Evaluation (Signed)
Physical Therapy Evaluation Patient Details Name: [name] [name] MRN: [id number] DOB: 1941-05-09 Today's Date: 01/27/2021  History of Present Illness  80 y.o. female presents to [facility] ED on 01/24/2021 with AMS, found to have multifocal PNA. PMH includes COPD, protein-calorie malnutrition with cahchexia, PEG tube in place, PVD, HLD, HTN, hypothyroidism.  Clinical Impression  Pt presents to PT with deficits in functional mobility, gait, power, balance, endurance. Pt fatigues quickly and is frail at this time with significant weakness. Pt tolerates upright positioning for sitting for 1-2 minutes prior to leaning onto R elbow, pt also fatigues quickly with standing at edge of bed. Pt will benefit from continued acute PT services to progress to ambulation and to improve activity tolerance. PT recommends SNF placement. If family decline SNF placement then a wheelchair will be beneficial at the time of discharge to improve safety when mobilizing in the home and community due to pt weakness and poor endurance.       Recommendations for follow up therapy are one component of a multi-disciplinary discharge planning process, led by the attending physician.  Recommendations may be updated based on patient status, additional functional criteria and insurance authorization.  Follow Up Recommendations SNF    Equipment Recommendations  Wheelchair (measurements PT);Wheelchair cushion (measurements PT)    Recommendations for Other Services       Precautions / Restrictions Precautions Precautions: Fall Precaution Comments: monitor SpO2 Restrictions Weight Bearing Restrictions: No      Mobility  Bed Mobility Overal bed mobility: Needs Assistance Bed Mobility: Supine to Sit;Sit to Supine     Supine to sit: Min assist;HOB elevated Sit to supine: Min assist;HOB elevated        Transfers Overall transfer level: Needs assistance Equipment used: Rolling walker (2 wheeled) Transfers: Sit to/from  Stand Sit to Stand: Min assist         General transfer comment: pt performs 2 sit to stand transfers  Ambulation/Gait                Stairs            Wheelchair Mobility    Modified Rankin (Stroke Patients Only)       Balance Overall balance assessment: Needs assistance Sitting-balance support: No upper extremity supported;Feet supported Sitting balance-Leahy Scale: Poor Sitting balance - Comments: pt tends to fatigue quickly and lean onto R elbow Postural control: Right lateral lean Standing balance support: Bilateral upper extremity supported Standing balance-Leahy Scale: Poor Standing balance comment: reliant on BUE support, pt stands for ~1 minute during hygiene task                             Pertinent Vitals/Pain Pain Assessment: No/denies pain    Home Living Family/patient expects to be discharged to:: Private residence Living Arrangements: Children Available Help at Discharge: Family;Available 24 hours/day Type of Home: House Home Access: Stairs to enter Entrance Stairs-Rails: Left Entrance Stairs-Number of Steps: 4 Home Layout: One level Home Equipment: Environmental consultant - 2 wheels Additional Comments: daughter is retired    Prior Function Level of Independence: Needs assistance   Gait / Transfers Assistance Needed: ambulates household distances with RW  ADL's / Homemaking Assistance Needed: reports Modified Independence with ADLs aside from supervision for tub transfers/showering tasks. Daughter completes iADLs  Comments: pt living with daughter since June 2022     Hand Dominance   Dominant Hand: Right    Extremity/Trunk Assessment   Upper Extremity Assessment Upper Extremity  Assessment: Generalized weakness    Lower Extremity Assessment Lower Extremity Assessment: Generalized weakness    Cervical / Trunk Assessment Cervical / Trunk Assessment: Kyphotic  Communication   Communication: Other (comment) (very soft spoken)   Cognition Arousal/Alertness: Awake/alert Behavior During Therapy: WFL for tasks assessed/performed Overall Cognitive Status: Within Functional Limits for tasks assessed                                        General Comments General comments (skin integrity, edema, etc.): pt on 3L [place], HR and SpO2 stable. Pt incontinent of bowel during session    Exercises     Assessment/Plan    PT Assessment Patient needs continued PT services  PT Problem List Decreased strength;Decreased activity tolerance;Decreased balance;Decreased mobility;Cardiopulmonary status limiting activity       PT Treatment Interventions Gait training;Stair training;Functional mobility training;Therapeutic activities;Therapeutic exercise;DME instruction;Balance training;Neuromuscular re-education;Patient/family education    PT Goals (Current goals can be found in the Care Plan section)  Acute Rehab PT Goals Patient Stated Goal: to improve strength PT Goal Formulation: With patient Time For Goal Achievement: 02/10/21 Potential to Achieve Goals: Fair    Frequency Min 3X/week   Barriers to discharge        Co-evaluation               AM-PAC PT "6 Clicks" Mobility  Outcome Measure Help needed turning from your back to your side while in a flat bed without using bedrails?: A Little Help needed moving from lying on your back to sitting on the side of a flat bed without using bedrails?: A Little Help needed moving to and from a bed to a chair (including a wheelchair)?: A Little Help needed standing up from a chair using your arms (e.g., wheelchair or bedside chair)?: A Little Help needed to walk in hospital room?: A Lot Help needed climbing 3-5 steps with a railing? : Total 6 Click Score: 15    End of Session Equipment Utilized During Treatment: Oxygen Activity Tolerance: Patient limited by fatigue Patient left: in bed;with call bell/phone within reach;with bed alarm set Nurse  Communication: Mobility status PT Visit Diagnosis: Other abnormalities of gait and mobility (R26.89);Muscle weakness (generalized) (M62.81)    Time: 5859-2924 PT Time Calculation (min) (ACUTE ONLY): 27 min   Charges:   PT Evaluation $PT Eval Low Complexity: [place], PT, DPT Acute Rehabilitation Pager: [phone]   Zenaida Niece 01/27/2021, 5:51 PM

## 2021-01-28 ENCOUNTER — Other Ambulatory Visit: Payer: Self-pay

## 2021-01-28 DIAGNOSIS — N179 Acute kidney failure, unspecified: Secondary | ICD-10-CM

## 2021-01-28 LAB — BASIC METABOLIC PANEL
Anion gap: 8 (ref 5–15)
Anion gap: 10 (ref 5–15)
Anion gap: 5 (ref 5–15)
Anion gap: 7 (ref 5–15)
BUN: 49 mg/dL — ABNORMAL HIGH (ref 8–23)
BUN: 44 mg/dL — ABNORMAL HIGH (ref 8–23)
BUN: 44 mg/dL — ABNORMAL HIGH (ref 8–23)
BUN: 44 mg/dL — ABNORMAL HIGH (ref 8–23)
CO2: 22 mmol/L (ref 22–32)
CO2: 23 mmol/L (ref 22–32)
CO2: 23 mmol/L (ref 22–32)
CO2: 22 mmol/L (ref 22–32)
Calcium: 7.5 mg/dL — ABNORMAL LOW (ref 8.9–10.3)
Calcium: 8 mg/dL — ABNORMAL LOW (ref 8.9–10.3)
Calcium: 7.5 mg/dL — ABNORMAL LOW (ref 8.9–10.3)
Calcium: 7.5 mg/dL — ABNORMAL LOW (ref 8.9–10.3)
Chloride: 124 mmol/L — ABNORMAL HIGH (ref 98–111)
Chloride: 119 mmol/L — ABNORMAL HIGH (ref 98–111)
Chloride: 123 mmol/L — ABNORMAL HIGH (ref 98–111)
Chloride: 118 mmol/L — ABNORMAL HIGH (ref 98–111)
Creatinine, Ser: 2.15 mg/dL — ABNORMAL HIGH (ref 0.44–1.00)
Creatinine, Ser: 2.07 mg/dL — ABNORMAL HIGH (ref 0.44–1.00)
Creatinine, Ser: 1.96 mg/dL — ABNORMAL HIGH (ref 0.44–1.00)
Creatinine, Ser: 1.91 mg/dL — ABNORMAL HIGH (ref 0.44–1.00)
GFR, Estimated: 23 mL/min — ABNORMAL LOW (ref >60)
GFR, Estimated: 24 mL/min — ABNORMAL LOW (ref >60)
GFR, Estimated: 25 mL/min — ABNORMAL LOW (ref >60)
GFR, Estimated: 26 mL/min — ABNORMAL LOW (ref >60)
Glucose, Bld: 154 mg/dL — ABNORMAL HIGH (ref 70–99)
Glucose, Bld: 146 mg/dL — ABNORMAL HIGH (ref 70–99)
Glucose, Bld: 129 mg/dL — ABNORMAL HIGH (ref 70–99)
Glucose, Bld: 136 mg/dL — ABNORMAL HIGH (ref 70–99)
Potassium: 3.7 mmol/L (ref 3.5–5.1)
Potassium: 3.8 mmol/L (ref 3.5–5.1)
Potassium: 3.6 mmol/L (ref 3.5–5.1)
Potassium: 3.7 mmol/L (ref 3.5–5.1)
Sodium: 154 mmol/L — ABNORMAL HIGH (ref 135–145)
Sodium: 152 mmol/L — ABNORMAL HIGH (ref 135–145)
Sodium: 151 mmol/L — ABNORMAL HIGH (ref 135–145)
Sodium: 147 mmol/L — ABNORMAL HIGH (ref 135–145)

## 2021-01-28 LAB — GLUCOSE, CAPILLARY
Glucose-Capillary: 116 mg/dL — ABNORMAL HIGH (ref 70–99)
Glucose-Capillary: 123 mg/dL — ABNORMAL HIGH (ref 70–99)
Glucose-Capillary: 124 mg/dL — ABNORMAL HIGH (ref 70–99)
Glucose-Capillary: 124 mg/dL — ABNORMAL HIGH (ref 70–99)
Glucose-Capillary: 133 mg/dL — ABNORMAL HIGH (ref 70–99)
Glucose-Capillary: 140 mg/dL — ABNORMAL HIGH (ref 70–99)

## 2021-01-28 LAB — ALBUMIN: Albumin: 2.1 g/dL — ABNORMAL LOW (ref 3.5–5.0)

## 2021-01-28 MED ORDER — CHLORHEXIDINE GLUCONATE 0.12 % MT SOLN
15.0000 mL | Freq: Two times a day (BID) | OROMUCOSAL | Status: DC
  Administered 2021-01-28 – 2021-01-31 (×6): 15 mL via OROMUCOSAL
  Filled 2021-01-28 (×6): qty 15

## 2021-01-28 MED ORDER — ORAL CARE MOUTH RINSE
15.0000 mL | Freq: Two times a day (BID) | OROMUCOSAL | Status: DC
  Administered 2021-01-29 – 2021-01-31 (×5): 15 mL via OROMUCOSAL

## 2021-01-28 NOTE — Evaluation (Signed)
Occupational Therapy Evaluation Patient Details Name: [name] [name] MRN: [id number] DOB: 07/10/1940 Today's Date: 01/28/2021   History of Present Illness 80 y.o. female presents to [facility] ED on 01/24/2021 with AMS, found to have multifocal PNA. PMH includes COPD, protein-calorie malnutrition with cahchexia, PEG tube in place, PVD, HLD, HTN, hypothyroidism.   Clinical Impression   Patient admitted for the diagnosis above.  PTA she was staying with her daughter, who is able to provide assist as needed, walked household distances with a RW, and needed generalized supervision for ADL completion from a sit/stand level.  Deficits are listed below.  Currently she is needing up to Mod A for ADL completion, and Min A for basic transfers.  Patient was sleeping soundly upon entering room, but was able to wake up easily.  She declined sitting up in the recliner.  She could benefit from a short term rehab stay at a SNF to regain mobility and ADL independence prior to returning home, but if family can provide increased assist, home with [facility] services is a possibility.         Recommendations for follow up therapy are one component of a multi-disciplinary discharge planning process, led by the attending physician.  Recommendations may be updated based on patient status, additional functional criteria and insurance authorization.   Follow Up Recommendations  SNF    Equipment Recommendations       Recommendations for Other Services       Precautions / Restrictions Precautions Precautions: Fall Restrictions Weight Bearing Restrictions: No      Mobility Bed Mobility Overal bed mobility: Needs Assistance Bed Mobility: Supine to Sit;Sit to Supine     Supine to sit: Min assist;HOB elevated Sit to supine: Min assist;HOB elevated     Patient Response: Cooperative  Transfers                      Balance Overall balance assessment: Needs assistance Sitting-balance support: No upper extremity  supported;Feet supported Sitting balance-Leahy Scale: Poor     Standing balance support: Bilateral upper extremity supported Standing balance-Leahy Scale: Poor Standing balance comment: reliant on BUE support                           ADL either performed or assessed with clinical judgement   ADL Overall ADL's : Needs assistance/impaired   Eating/Feeding Details (indicate cue type and reason): tube feedings Grooming: Wash/dry hands;Wash/dry face;Set up;Bed level   Upper Body Bathing: Minimal assistance;Bed level   Lower Body Bathing: Moderate assistance;Bed level   Upper Body Dressing : Minimal assistance;Sitting   Lower Body Dressing: Maximal assistance;Bed level   Toilet Transfer: Minimal assistance;Squat-pivot;BSC                   Vision Patient Visual Report: No change from baseline                  Pertinent Vitals/Pain Pain Assessment: No/denies pain     Hand Dominance Right   Extremity/Trunk Assessment Upper Extremity Assessment Upper Extremity Assessment: Generalized weakness (grossly 3+/5 B UE's)   Lower Extremity Assessment Lower Extremity Assessment: Defer to PT evaluation   Cervical / Trunk Assessment Cervical / Trunk Assessment: Kyphotic   Communication Communication Communication: Other (comment) (very soft spoken)   Cognition Arousal/Alertness: Awake/alert Behavior During Therapy: WFL for tasks assessed/performed Overall Cognitive Status: No family/caregiver present to determine baseline cognitive functioning  General Comments: following commands well, oriented to self, place and situation   General Comments       Exercises     Shoulder Instructions      Home Living Family/patient expects to be discharged to:: Private residence Living Arrangements: Children Available Help at Discharge: Family;Available 24 hours/day Type of Home: House Home Access: Stairs to  enter [employer] of Steps: 4 Entrance Stairs-Rails: Left Home Layout: One level     Bathroom Shower/Tub: Teacher, early years/pre: Standard     Home Equipment: Environmental consultant - 2 wheels          Prior Functioning/Environment Level of Independence: Needs assistance  Gait / Transfers Assistance Needed: ambulates household distances with RW ADL's / Homemaking Assistance Needed: reports Modified Independence with ADLs aside from supervision for tub transfers/showering tasks. Daughter completes iADLs   Comments: pt living with daughter        OT Problem List: Decreased strength;Decreased activity tolerance;Impaired balance (sitting and/or standing)      OT Treatment/Interventions: Self-care/ADL training;Therapeutic exercise;Energy conservation;DME and/or AE instruction;Balance training;Patient/family education;Therapeutic activities    OT Goals(Current goals can be found in the care plan section) Acute Rehab OT Goals Patient Stated Goal: Feel stronger OT Goal Formulation: With patient Time For Goal Achievement: 02/11/21 Potential to Achieve Goals: Fair  OT Frequency: Min 2X/week   Barriers to D/C:    none noted       Co-evaluation              AM-PAC OT "6 Clicks" Daily Activity     Outcome Measure Help from another person eating meals?: Total Help from another person taking care of personal grooming?: A Little Help from another person toileting, which includes using toliet, bedpan, or urinal?: A Little Help from another person bathing (including washing, rinsing, drying)?: A Lot Help from another person to put on and taking off regular upper body clothing?: A Little Help from another person to put on and taking off regular lower body clothing?: A Lot 6 Click Score: 14   End of Session Nurse Communication: Mobility status  Activity Tolerance: Patient limited by lethargy;Patient limited by fatigue Patient left: in bed;with call bell/phone within  reach;with bed alarm set  OT Visit Diagnosis: Unsteadiness on feet (R26.81);Muscle weakness (generalized) (M62.81)                Time: 6734-1937 OT Time Calculation (min): 16 min Charges:  OT General Charges $OT Visit: 1 Visit OT Evaluation $OT Eval Moderate Complexity: 1 Mod  01/28/2021  RP, OTR/L  Acute Rehabilitation Services  Office:  [phone]   Metta Clines 01/28/2021, 2:03 PM

## 2021-01-28 NOTE — Progress Notes (Signed)
FPTS Interim Progress Note  S: Patient with no complaints or concerns at this time.  Went to evaluate her at the start of shift.  Continue to monitor every 8 hour BMPs for hypernatremia which seems to be improving at the correct interval.  She has another 1 pending at this time.  O: BP (!) 142/67 (BP Location: Left Arm)   Pulse 88   Temp 99.1 F (37.3 C) (Oral)   Resp 17   Ht 5\' 4"  (1.626 m)   Wt 50.2 kg   SpO2 97%   BMI 19.00 kg/m   General: Alert and oriented in no apparent distress Heart: Regular rate and rhythm with no murmurs appreciated Lungs: CTA bilaterally, no wheezing Skin: Warm and dry  A/P: Continue to monitor every 8 hour BMPs for her hypernatremia.  It is continuing to improve at a correct interval.  She has a BMP currently in process at this time, the next one will be completed early tomorrow morning.  We will keep a close watch on these as well as her vitals overnight.  She otherwise has no concerns or complaints.  Remainder per daytime progress note.  Lurline Del, DO 01/28/2021, 9:01 PM PGY-3, [place] Medicine Service pager [phone]

## 2021-01-28 NOTE — Care Management Important Message (Signed)
Important Message  Patient Details  Name: [name] [name] MRN: [id number] Date of Birth: 05-04-1941   Medicare Important Message Given:  Yes     [name] Montine Circle 01/28/2021, 3:16 PM

## 2021-01-28 NOTE — TOC Initial Note (Signed)
Transition of Care [facility]) - Initial/Assessment Note    Patient Details  Name: [name] [name] MRN: [id number] Date of Birth: [birth year]  Transition of Care [facility]) CM/SW Contact:    [name] [name] Phone Number: 01/28/2021, 9:43 AM  Clinical Narrative:   CSW met with pt regarding recommendations for SNF.  Pt with AMS, reports she has not been to SNF before, willing to go.  Permission given to speak with daughter [name] [name].  Pt reports she is vaccinated for covid with one booster.  CSW spoke with daughter [name] [name].  Pt was just DC from [facility] SNF in [place] on 9/3.  Per Patient [name] [name], pt was there for 22 days, 8/12-01/16/21 and is now home with [facility].  Daughter confirms she is still trying to speak with other family members regarding the [place] discussion she has had with palliative.  CSW and daughter discussed home options including continued HH vs comfort options with hospice.  Daughter does confirm that SNF options with daily copays are probably not financially feasible at this time.                  Expected Discharge Plan:  (Family still considering GOC--plan for home with services) Barriers to Discharge: Other (must enter comment) ([place] decision)   Patient Goals and CMS Choice   CMS Medicare.gov Compare Post Acute Care list provided to:: Patient Choice offered to / list presented to : Patient  Expected Discharge Plan and Services Expected Discharge Plan:  (Family still considering GOC--plan for home with services)     Post Acute Care Choice:  (TBD) Living arrangements for the past 2 months: Single Family Home                                      Prior Living Arrangements/Services Living arrangements for the past 2 months: Single Family Home Lives with:: Adult Children Patient language and need for interpreter reviewed:: Yes Do you feel safe going back to the place where you live?: Yes      Need for Family Participation in Patient Care: Yes  (Comment) Care giver support system in place?: Yes (comment) Current home services: Other (comment) ([facility]) Criminal Activity/Legal Involvement Pertinent to Current Situation/Hospitalization: No - Comment as needed  Activities of Daily Living      Permission Sought/Granted Permission sought to share information with : Family Supports Permission granted to share information with : Yes, Verbal Permission Granted  Share Information with NAME: daughter [name] [name]           Emotional Assessment Appearance:: Appears stated age Attitude/Demeanor/Rapport: Engaged Affect (typically observed): Pleasant Orientation: : Oriented to Self, Oriented to Place, Oriented to  Time, Oriented to Situation Alcohol / Substance Use: Not Applicable Psych Involvement: No (comment)  Admission diagnosis:  Hyperchloremia [E87.8] Hypernatremia [E87.0] Healthcare-associated pneumonia [J18.9] AKI (acute kidney injury) ([place]) [N17.9] Sepsis ([place]) [A41.9] Patient Active Problem List   Diagnosis Date Noted   Hypernatremia    Healthcare-associated pneumonia    Underweight 12/24/2020   Frailty syndrome in geriatric patient 12/24/2020   Emphysema lung ([place]) 12/24/2020   PEG (percutaneous endoscopic gastrostomy) status ([place]) 12/24/2020   Dysphagia, pharyngoesophageal phase    Sepsis ([place])    Protein-calorie malnutrition, severe 12/16/2020   Aspiration pneumonia ([place]) 12/15/2020   Weight loss, non-intentional 12/15/2020   Cachexia ([place]) 12/15/2020   Carotid artery disease ([place]) 08/13/2019   PVD (peripheral vascular disease) ([place])  HTN (hypertension)    ATHEROSLERO NATV ART EXTREM W/INTERMIT CLAUDICAT 12/22/2009   PCP:  [name] Shivers, MD Pharmacy:   [facility] [place], [place] - [address] AT [place] [address] [place] [address] Phone: [phone] Fax: [phone]     Social Determinants of Health (SDOH) Interventions    Readmission Risk  Interventions No flowsheet data found.

## 2021-01-28 NOTE — Progress Notes (Signed)
Family Medicine Teaching Service Daily Progress Note Intern Pager: [phone]  Patient name: [name] [name] Medical record number: [id number] Date of birth: 10/04/1940 Age: 80 y.o. Gender: female  Primary Care Provider: Maryella Shivers, MD Consultants: Palliative, Dietician Code Status: DNR/DNI  Pt Overview and Major Events to Date:  -Patient admitted overnight - 01/24/21  Assessment and Plan: [name] [name] is a 81 y.o. female presenting with AMS in the context of multifocal PNA. PMH is significant for COPD, protein-calorie malnutrition with cahchexia, PEG tube in place, PVD, HLD, HTN, hypothyroidism.  AMS (resolved)  Acute hypoxic respiratory failure in the context of multifocal PNA meeting sepsis criteria - Continue cefepime for HAP, Day 5/7 (9/11-01/2017) - Blood culture, urine culture: NGTD - On 3 L [place] - Strict I's and O's - Daily weights - Up with assistance - Continuous pulse ox - Continuous cardiac monitoring  Goals of Care Palliative care following, family needs time to decide and speak with other family members. Will reach out to palliative care for follow up  Hypovolemic Hypernatremia Most recent sodium was 154 at 0100 [160 @ 0545 9/14] Will try to keep correction less than 10 meq over the next 24 hrs. -Echo results: EF 45-50%, otherwise normal -1/2 NS 90 mL/h, monitor fluid status -Free water 300 cc daily -BMP's q 8 hours   Hyperchloremia Chloride level 124, CO2 22 today.  Likely high in setting of fluid resuscitation. We will continue to trend chloride  Elevated LFTs -Hepatic function panel in am [ AST/ALT was 53/116 a day ago] -IV fluids as above -Continue to trend LFTs daily  COPD Requires O2 at night at baseline, currently on 3 L nasal cannula -Continue Ventolin and Spiriva  Protein-calorie malnutrition Patient with cachexia, albumin of 2.1 today, PEG tube into in place from hospitalization August, patient receiving feeds at goal.  -Nutrition consult  appreciate recs -Osmolite 1.2 @ 55 ml/hr (1320 ml/day)  -ProSource TF sprays 45 mL once daily -SLP following  AKI on CKD Baseline creatinine of 1.3, was 2.76 on admission. Creatinine 2.07, BUN 44 BMPs as above  HTN Blood pressure stable, ranging from 152/57-120/58. Home medications include amlodipine and metoprolol tartate -Hold home amlodipine 5 mg -Continue home metoprolol 25 mg twice daily  Hypothyroidism Home medication Synthroid 25 mcg daily.  Was previously on 125 mcg daily.  Appears to have recently been decreased due to TSH 0.018 on 12/15/2020. TSH was 32.944 on 01/27/21, increased dose of Synthroid. -Synthroid 50 -Recheck TSH in 4 weeks (10/15)  HLD Continue home rosuvastatin 40 mg   Anemia -Hemoglobin 9.9 today, hematocrit 33.7  Constipation Patient reports 2 bowel movements yesterday with no abdominal discomfort on exam this morning.  -Continue MiraLAX  FEN/GI: Tube Feeds PPx: Lovenox Dispo: Possibly comfort care   X family decision . Barriers include None.   Subjective:  Patient has no complaints and is not in acute distress. Patient appreciates good ease of breathing and no discomfort or pain. She reported 2 bowel movements yesterday, but had 3 documented in the chart.   Objective: Temp:  [98 F (36.7 C)-98.6 F (37 C)] 98.5 F (36.9 C) (09/14 1933) Pulse Rate:  [82-89] 89 (09/15 0345) Resp:  [16-22] 16 (09/15 0345) BP: (120-152)/(50-78) 139/78 (09/15 0345) SpO2:  [95 %-100 %] 95 % (09/15 0345) Physical Exam: General: Frail, ill appearing, alert and oriented to person and place Cardiovascular: RRR, NRMG Respiratory: CTABL, no wheezes or stridor Abdomen: Soft, non-tender, non-distended, peg tube appreciated Extremities: Pulses intact in all extremities, no edema  appreciated on exam  Laboratory: Recent Labs  Lab 01/25/21 0524 01/26/21 0122 01/26/21 1656 01/27/21 1039  WBC 13.1* 10.8*  --  7.7  HGB 8.1* 7.8* 9.4* 9.9*  HCT 29.6* 27.4* 31.3* 33.7*   PLT 128* 133*  --  163   Recent Labs  Lab 01/26/21 2031 01/27/21 0231 01/27/21 0545 01/27/21 1249 01/27/21 1816 01/28/21 0046  NA 162* 157* 160* 156* 156* 154*  K 3.4* 4.2 3.3* 4.3 4.1 3.7  CL >130* >130* 125* 127* 126* 124*  CO2 23 23 23 22 23 22   BUN 55* 53* 52* 53* 50* 49*  CREATININE 2.48* 2.39* 2.28* 2.21* 2.22* 2.15*  CALCIUM 7.8* 7.6* 8.0* 7.6* 7.8* 7.5*  PROT 5.7* 5.4* 5.5*  --   --   --   BILITOT 0.7 0.7 0.8  --   --   --   ALKPHOS 52 48 54  --   --   --   ALT 131* 124* 116*  --   --   --   AST 51* 60* 53*  --   --   --   GLUCOSE 136* 136* 160* 138* 133* 154*    Imaging/Diagnostic Tests: None  Holley Bouche, MD 01/28/2021, 6:26 AM PGY-1, [place] Intern pager: [phone], text pages welcome

## 2021-01-29 LAB — GLUCOSE, CAPILLARY
Glucose-Capillary: 106 mg/dL — ABNORMAL HIGH (ref 70–99)
Glucose-Capillary: 111 mg/dL — ABNORMAL HIGH (ref 70–99)
Glucose-Capillary: 114 mg/dL — ABNORMAL HIGH (ref 70–99)
Glucose-Capillary: 137 mg/dL — ABNORMAL HIGH (ref 70–99)
Glucose-Capillary: 144 mg/dL — ABNORMAL HIGH (ref 70–99)
Glucose-Capillary: 149 mg/dL — ABNORMAL HIGH (ref 70–99)

## 2021-01-29 LAB — BASIC METABOLIC PANEL
Anion gap: 8 (ref 5–15)
Anion gap: 6 (ref 5–15)
BUN: 42 mg/dL — ABNORMAL HIGH (ref 8–23)
BUN: 42 mg/dL — ABNORMAL HIGH (ref 8–23)
CO2: 23 mmol/L (ref 22–32)
CO2: 22 mmol/L (ref 22–32)
Calcium: 7.6 mg/dL — ABNORMAL LOW (ref 8.9–10.3)
Calcium: 7.6 mg/dL — ABNORMAL LOW (ref 8.9–10.3)
Chloride: 117 mmol/L — ABNORMAL HIGH (ref 98–111)
Chloride: 116 mmol/L — ABNORMAL HIGH (ref 98–111)
Creatinine, Ser: 1.96 mg/dL — ABNORMAL HIGH (ref 0.44–1.00)
Creatinine, Ser: 1.88 mg/dL — ABNORMAL HIGH (ref 0.44–1.00)
GFR, Estimated: 25 mL/min — ABNORMAL LOW (ref >60)
GFR, Estimated: 27 mL/min — ABNORMAL LOW (ref >60)
Glucose, Bld: 145 mg/dL — ABNORMAL HIGH (ref 70–99)
Glucose, Bld: 125 mg/dL — ABNORMAL HIGH (ref 70–99)
Potassium: 3.7 mmol/L (ref 3.5–5.1)
Potassium: 3.9 mmol/L (ref 3.5–5.1)
Sodium: 148 mmol/L — ABNORMAL HIGH (ref 135–145)
Sodium: 144 mmol/L (ref 135–145)

## 2021-01-29 LAB — HEPATIC FUNCTION PANEL
ALT: 97 U/L — ABNORMAL HIGH (ref 0–44)
AST: 64 U/L — ABNORMAL HIGH (ref 15–41)
Albumin: 1.8 g/dL — ABNORMAL LOW (ref 3.5–5.0)
Alkaline Phosphatase: 53 U/L (ref 38–126)
Bilirubin, Direct: <0.1 mg/dL (ref 0.0–0.2)
Total Bilirubin: 0.3 mg/dL (ref 0.3–1.2)
Total Protein: 5 g/dL — ABNORMAL LOW (ref 6.5–8.1)

## 2021-01-29 LAB — CULTURE, BLOOD (SINGLE)
Culture: NO GROWTH
Special Requests: ADEQUATE

## 2021-01-29 MED ORDER — OSMOLITE 1.5 CAL PO LIQD
296.0000 mL | Freq: Three times a day (TID) | ORAL | Status: DC
  Administered 2021-01-29 – 2021-01-31 (×10): 296 mL
  Filled 2021-01-29 (×12): qty 474

## 2021-01-29 MED ORDER — METOPROLOL TARTRATE 25 MG PO TABS
25.0000 mg | ORAL_TABLET | Freq: Three times a day (TID) | ORAL | Status: AC
  Administered 2021-01-29 (×2): 25 mg via ORAL
  Filled 2021-01-29 (×2): qty 1

## 2021-01-29 MED ORDER — POLYETHYLENE GLYCOL 3350 17 G PO PACK
17.0000 g | PACK | ORAL | Status: DC
  Administered 2021-01-31: 17 g
  Filled 2021-01-29 (×2): qty 1

## 2021-01-29 MED ORDER — METOPROLOL SUCCINATE ER 100 MG PO TB24
100.0000 mg | ORAL_TABLET | Freq: Every day | ORAL | Status: DC
  Administered 2021-01-30 – 2021-01-31 (×2): 100 mg via ORAL
  Filled 2021-01-29 (×2): qty 1

## 2021-01-29 MED ORDER — FREE WATER
500.0000 mL | Freq: Four times a day (QID) | Status: DC
  Administered 2021-01-29 – 2021-01-30 (×4): 500 mL

## 2021-01-29 NOTE — Progress Notes (Signed)
Family Medicine Teaching Service Daily Progress Note Intern Pager: [phone]  Patient name: [name] [name] Medical record number: [id number] Date of birth: 1941-03-19 Age: 80 y.o. Gender: female  Primary Care Provider: Maryella Shivers, MD Consultants: Palliative, Dietician Code Status: DNR/DNI  Pt Overview and Major Events to Date:  -Patient admitted overnight - 01/24/21  Assessment and Plan: [name] [name] is a 80 y.o. female presenting with AMS in the context of multifocal PNA. PMH is significant for COPD, protein-calorie malnutrition with cahchexia, PEG tube in place, PVD, HLD, HTN, hypothyroidism.   AMS (resolved)  Acute hypoxic respiratory failure in the context of multifocal PNA meeting sepsis criteria -Possible Dispo tmrw,  -Continue cefepime for HAP, day 6/7 (9/11-9/18) -Blood culture and urine culture: NGTD -On 0.5 L [place] -Strict I's and O's -Daily weights -Up with assistance -Continuous pulse ox -Continuous cardiac monitoring  Goals of Care Family decided patient would receive home health with palliative care.   Hypovolemic Hypernatremia Most recent sodium was 147.  We will try to keep correction less than 10 mEq over the next 24 hours -D/c IVF -Free water 500 cc daily q 6 -BMP q 8 hours  Hyperchloremia Chloride 116 today, CO2 22 today. Patient chloride is trending down nicely and improved with improvement in patient volume status. We will continue to trend chloride  Elevated LFTs LFT's have trended down and nearly normalized, demonstrating a good response to fluid resuscitation.  AST / ALT 64/97 [ AST/ALT was 53/116 on 01/28/21] -IV fluids as above -Continue to trend LFTs daily  COPD Requires O2 at night at baseline, currently on 3 L nasal cannula -Continue Ventolin and Spiriva  Protein-calorie malnutrition Patient with cachexia, albumin of X today, PEG tube in place from hospitalization in August, patient receiving feeding at goal -Nutrition consult  appreciate recs -Osmolite 1.2 at 55 mL/h  -Prosource TF 45 mL once daily -SLP following  AKI on CKD -Creatinine 1.91, BUN 44 -BMPs as above  HTN -Hold amlodipine 5 mg -Continue metoprolol tartate 25 q 8 today, transition to metoprolol succinate 100 mg daily tomorrow  Hypothyroidism -Synthroid 50 -Recheck TSH in 4 weeks (10/15)  HLD -Continue home rosuvastatin 40 mg  Anemia Hemoglobin 9.9, HCT 33.7 yesterday (stable)   Constipation -MiraLAX every other day  Dispo: Home health with palliative, wheel chair per PT notes  FEN/GI: Tube feeds PPx: Lovenox Dispo: Possibly tomorrow   pending clinical improvement . Barriers include None.   Subjective:  Patient looking better today, more interactive and awake, laying in bed with family visiting. Patient requested a breathing treatment, and I spoke to nurse about giving a nebulized albuterol treatment.   Objective: Temp:  [97.8 F (36.6 C)-99.1 F (37.3 C)] 97.8 F (36.6 C) (09/16 0434) Pulse Rate:  [76-88] 84 (09/16 0434) Resp:  [17-20] 20 (09/16 0434) BP: (112-156)/(56-77) 129/65 (09/16 0434) SpO2:  [91 %-98 %] 92 % (09/16 0434) Physical Exam: General: Frail, elderly african [place] woman, appears clinically improved with increased activity and interactions. Cardiovascular: RRR, NRMG Respiratory: CTABL, no wheezes or stridor Abdomen: Soft, non-tender, non-distended Extremities: Pulses intact on all extremities  Laboratory: Recent Labs  Lab 01/25/21 0524 01/26/21 0122 01/26/21 1656 01/27/21 1039  WBC 13.1* 10.8*  --  7.7  HGB 8.1* 7.8* 9.4* 9.9*  HCT 29.6* 27.4* 31.3* 33.7*  PLT 128* 133*  --  163   Recent Labs  Lab 01/26/21 2031 01/27/21 0231 01/27/21 0545 01/27/21 1249 01/28/21 0634 01/28/21 1244 01/28/21 2037  NA 162* 157* 160*   < >  152* 151* 147*  K 3.4* 4.2 3.3*   < > 3.8 3.6 3.7  CL >130* >130* 125*   < > 119* 123* 118*  CO2 23 23 23    < > 23 23 22   BUN 55* 53* 52*   < > 44* 44* 44*   CREATININE 2.48* 2.39* 2.28*   < > 2.07* 1.96* 1.91*  CALCIUM 7.8* 7.6* 8.0*   < > 8.0* 7.5* 7.5*  PROT 5.7* 5.4* 5.5*  --   --   --   --   BILITOT 0.7 0.7 0.8  --   --   --   --   ALKPHOS 52 48 54  --   --   --   --   ALT 131* 124* 116*  --   --   --   --   AST 51* 60* 53*  --   --   --   --   GLUCOSE 136* 136* 160*   < > 146* 129* 136*   < > = values in this interval not displayed.      Imaging/Diagnostic Tests: None  Holley Bouche, MD 01/29/2021, 6:15 AM PGY-1, [place] Intern pager: [phone], text pages welcome

## 2021-01-29 NOTE — TOC Progression Note (Addendum)
Transition of Care [facility]) - Progression Note    Patient Details  Name: [name] [name] MRN: [id number] Date of Birth: [birth year]  Transition of [facility]) CM/SW Contact  Joanne Chars, LCSW Phone Number: 01/29/2021, 1:22 PM  Clinical Narrative:   CSW spoke with pt daughter [name] by phone and she would like to have pt DC home with [facility] when she is ready.  [name] was in pt room and CSW then met with her and pt, confirmed this choice, provided choice document.  [facility] already in place and they would like to continue with them.  CSW also discussed with daughter if she would be interested in palliative care services and daughter would like to hear more about what those services could provide.  We discussed agency options, they live in [place] and are agreeable to [place].  Daughter asked about wheelchair but reports she received a walker through insurance a few months ago.  Cash price for wheelchair $350, per [employer].  Updated daughter and she will look at other options to get wheelchair.  Pt already has walker, bedside commode, shower chair at home.    CSW spoke with Malachy Mood at [facility], who confirmed that pt is active with their service and they can resume at [place] spoke with [place] at [place] who will reach out to family to discuss potential palliative care.     Expected Discharge Plan: [place] Barriers to Discharge: Continued Medical Work up  Expected Discharge Plan and Services Expected Discharge Plan: [place] Choice: [place] arrangements for the past 2 months: Single Family Home                 DME Arranged: N/A         HH Arranged: PT, OT HH Agency: [place] Date [place]: 01/29/21 Time [place]: 1322 Representative spoke with at [place]: [place] ([place]) Interventions     Readmission Risk Interventions No flowsheet data found.

## 2021-01-29 NOTE — Progress Notes (Signed)
Pt requested breathing treatment respiratory notified

## 2021-01-29 NOTE — Progress Notes (Signed)
FPTS Brief Progress Note  S: Resting in bed comfortably. Nursing at bedside giving warm blanket.    O: BP (!) 141/58 (BP Location: Left Arm)   Pulse 87   Temp 99.3 F (37.4 C) (Oral)   Resp 13   Ht 5\' 4"  (1.626 m)   Wt 50.2 kg   SpO2 94%   BMI 19.00 kg/m     A/P: AMS (resolved)  Acute hypoxic respiratory failure in the context of multifocal PNA meeting sepsis criteria -Possible discharge tomorrow  - Orders reviewed. Labs for AM ordered, which was adjusted as needed.   Gerlene Fee, DO 01/29/2021, 10:02 PM PGY-3, [place] Family Medicine Night Resident  Please page [phone] with questions.

## 2021-01-29 NOTE — Progress Notes (Signed)
Nutrition Follow-up  DOCUMENTATION CODES:  Severe malnutrition in context of chronic illness  INTERVENTION:  Transition to bolus TF via PEG: -Provide 1.25 cartons (260m) Osmolite 1.5 QID, flush with 557mfree water before and after each bolus -11562mdditional free water flush QID (this is the recommended free water regimen at discharge; continue with free water flushes per MD while still admitted given ongoing hypernatremia)   Tube feeding regimen provides 1775 kcal, 74.5 grams of protein, and 905 ml of H2O.    Total free water with flushes: 1765 ml  NUTRITION DIAGNOSIS:  Severe Malnutrition related to chronic illness (COPD) as evidenced by severe fat depletion, severe muscle depletion. -- ongoing  GOAL:  Patient will meet greater than or equal to 90% of their needs -- met with TF  MONITOR:  Labs, Weight trends, TF tolerance, I & O's  REASON FOR ASSESSMENT:  Consult Enteral/tube feeding initiation and management  ASSESSMENT:  Pt with PMH significant for COPD, protein-calorie malnutrition w/ cachexia s/p PEG placement, PVD, HLD, HTN, and hypothyroidism admitted with AMS and acute hypoxic respiratory failure in the context of multifocal PNA meeting sepsis criteria.  Received secure chat from Pharmacy requesting pt be transitioned to bolus TF in preparation for discharge home. RD to provide recommendations. Pt currently tolerating the following via PEG: Osmolite 1.2 @ 81m45m w/ 45ml33msource TF daily and free water per MD (currently 500ml 28m  Labs: Na 148 (H), BUN 42 (H), Cr 1.96 (H) Elevated LFTs CBGs: 124-149 x 24 hours Medications: miralax, IV abx  UOP: 1250ml x65mours I/O: +9721ml si25madmit  Diet Order:   Diet Order             Diet NPO time specified Except for: Ice Chips  Diet effective now                  EDUCATION NEEDS:  No education needs have been identified at this time  Skin:  Skin Assessment: Reviewed RN Assessment  Last BM:  9/15 type  6  Height:  Ht Readings from Last 1 Encounters:  01/24/21 _0  (1.626 m)   Weight:  Wt Readings from Last 1 Encounters:  01/25/21 50.2 kg   BMI:  Body mass index is 19 kg/m.  Estimated Nutritional Needs:  Kcal:  1500-1700 Protein:  75-85 grams Fluid:  >1.5L    [name] [name], LDN (she/her/hers) RD pager number and weekend/on-call pager number located in [email or web address]

## 2021-01-29 NOTE — Progress Notes (Signed)
FPTS Interim Progress Note Went to speak to patient and family to update them on clinical course. Spoke with patient's sister and daughter about clinical course and improvement. Daughter asked about how to provide care for her mother, the patient, at home. Informed daughter that they would be discharged with detailed instructions and taught how to properly deliver meds and water through peg tube. Family was agreeable. Also spoke with patient's nurse to confirm they would train family members and discharge them with instructions.    Holley Bouche, MD 01/29/2021, 3:11 PM PGY-1, [place] [facility] pager [phone]

## 2021-01-29 NOTE — Progress Notes (Signed)
Physical Therapy Treatment Patient Details Name: [name] [name] MRN: [id number] DOB: [birth year] Today's Date: 01/29/2021   History of Present Illness 80 y.o. female presents to [facility] ED on 01/24/2021 with AMS, found to have multifocal PNA. PMH includes COPD, protein-calorie malnutrition with cahchexia, PEG tube in place, PVD, HLD, HTN, hypothyroidism.    PT Comments    Pt remains weak and with poor activity tolerance. Stool incontinence resulted in pt having to stand several times for pericare. Assisted to chair after that but too fatigued to attempt amb. Pt will need significant assist from family at dc.    Recommendations for follow up therapy are one component of a multi-disciplinary discharge planning process, led by the attending physician.  Recommendations may be updated based on patient status, additional functional criteria and insurance authorization.  Follow Up Recommendations  Home health PT (Daughter plans to take pt home. Does not want SNF.)     Equipment Recommendations  Wheelchair (measurements PT);Wheelchair cushion (measurements PT)    Recommendations for Other Services       Precautions / Restrictions Precautions Precautions: Fall;Other (comment) Precaution Comments: PEG     Mobility  Bed Mobility Overal bed mobility: Needs Assistance Bed Mobility: Supine to Sit     Supine to sit: Min assist;HOB elevated     General bed mobility comments: Assist to elevate trunk into sitting    Transfers Overall transfer level: Needs assistance Equipment used: Rolling walker (2 wheeled) Transfers: Sit to/from [facility] Sit to Stand: Min assist Stand pivot transfers: Min assist       General transfer comment: Assist to bring hips up and for balance. Small pivotal steps bed to chair.  Ambulation/Gait             General Gait Details: Did not attempt due to pt incontinent of loose stool.   Stairs             Wheelchair Mobility     Modified Rankin (Stroke Patients Only)       Balance Overall balance assessment: Needs assistance Sitting-balance support: No upper extremity supported;Feet supported Sitting balance-Leahy Scale: Fair     Standing balance support: Bilateral upper extremity supported Standing balance-Leahy Scale: Poor Standing balance comment: Walker and min assist for static standing. Pt stood x 2 for 30-60 seconds for pericare.                            Cognition Arousal/Alertness: Awake/alert Behavior During Therapy: WFL for tasks assessed/performed Overall Cognitive Status: No family/caregiver present to determine baseline cognitive functioning                                 General Comments: Follows commands.      Exercises      General Comments        Pertinent Vitals/Pain Pain Assessment: No/denies pain    Home Living                      Prior Function            PT Goals (current goals can now be found in the care plan section) Progress towards PT goals: Progressing toward goals    Frequency    Min 3X/week      PT Plan Discharge plan needs to be updated    Co-evaluation  AM-PAC PT "6 Clicks" Mobility   Outcome Measure  Help needed turning from your back to your side while in a flat bed without using bedrails?: A Little Help needed moving from lying on your back to sitting on the side of a flat bed without using bedrails?: A Little Help needed moving to and from a bed to a chair (including a wheelchair)?: A Little Help needed standing up from a chair using your arms (e.g., wheelchair or bedside chair)?: A Little Help needed to walk in hospital room?: A Lot Help needed climbing 3-5 steps with a railing? : Total 6 Click Score: 15    End of Session Equipment Utilized During Treatment: Oxygen Activity Tolerance: Patient limited by fatigue;Other (comment) (stool incontinence) Patient left: in chair;with  call bell/phone within reach;with chair alarm set Nurse Communication: Mobility status PT Visit Diagnosis: Other abnormalities of gait and mobility (R26.89);Muscle weakness (generalized) (M62.81)     Time: 2244-9753 PT Time Calculation (min) (ACUTE ONLY): 36 min  Charges:  $Therapeutic Exercise: 23-37 mins                     Princess Anne Pager [phone] Office [place] 01/29/2021, 5:13 PM

## 2021-01-30 LAB — GLUCOSE, CAPILLARY
Glucose-Capillary: 110 mg/dL — ABNORMAL HIGH (ref 70–99)
Glucose-Capillary: 124 mg/dL — ABNORMAL HIGH (ref 70–99)
Glucose-Capillary: 144 mg/dL — ABNORMAL HIGH (ref 70–99)
Glucose-Capillary: 154 mg/dL — ABNORMAL HIGH (ref 70–99)
Glucose-Capillary: 157 mg/dL — ABNORMAL HIGH (ref 70–99)
Glucose-Capillary: 84 mg/dL (ref 70–99)
Glucose-Capillary: 95 mg/dL (ref 70–99)

## 2021-01-30 LAB — BASIC METABOLIC PANEL
Anion gap: 9 (ref 5–15)
Anion gap: 6 (ref 5–15)
BUN: 41 mg/dL — ABNORMAL HIGH (ref 8–23)
BUN: 41 mg/dL — ABNORMAL HIGH (ref 8–23)
CO2: 22 mmol/L (ref 22–32)
CO2: 25 mmol/L (ref 22–32)
Calcium: 7.9 mg/dL — ABNORMAL LOW (ref 8.9–10.3)
Calcium: 7.8 mg/dL — ABNORMAL LOW (ref 8.9–10.3)
Chloride: 115 mmol/L — ABNORMAL HIGH (ref 98–111)
Chloride: 113 mmol/L — ABNORMAL HIGH (ref 98–111)
Creatinine, Ser: 1.86 mg/dL — ABNORMAL HIGH (ref 0.44–1.00)
Creatinine, Ser: 1.92 mg/dL — ABNORMAL HIGH (ref 0.44–1.00)
GFR, Estimated: 27 mL/min — ABNORMAL LOW (ref >60)
GFR, Estimated: 26 mL/min — ABNORMAL LOW (ref >60)
Glucose, Bld: 99 mg/dL (ref 70–99)
Glucose, Bld: 110 mg/dL — ABNORMAL HIGH (ref 70–99)
Potassium: 3.9 mmol/L (ref 3.5–5.1)
Potassium: 3.9 mmol/L (ref 3.5–5.1)
Sodium: 146 mmol/L — ABNORMAL HIGH (ref 135–145)
Sodium: 144 mmol/L (ref 135–145)

## 2021-01-30 LAB — HEPATIC FUNCTION PANEL
ALT: 119 U/L — ABNORMAL HIGH (ref 0–44)
AST: 108 U/L — ABNORMAL HIGH (ref 15–41)
Albumin: 1.8 g/dL — ABNORMAL LOW (ref 3.5–5.0)
Alkaline Phosphatase: 47 U/L (ref 38–126)
Bilirubin, Direct: <0.1 mg/dL (ref 0.0–0.2)
Total Bilirubin: 0.4 mg/dL (ref 0.3–1.2)
Total Protein: 5.1 g/dL — ABNORMAL LOW (ref 6.5–8.1)

## 2021-01-30 MED ORDER — FREE WATER
600.0000 mL | Freq: Four times a day (QID) | Status: AC
  Administered 2021-01-30 – 2021-01-31 (×4): 600 mL

## 2021-01-30 NOTE — Progress Notes (Signed)
FPTS Interim Progress Note  S:Patient sleeping and resting comfortably.  Rounded with primary RN.  No concerns voiced.  No orders required.  Appreciated nightly round.  O: BP (!) 141/80   Pulse 83   Temp 98 F (36.7 C) (Oral)   Resp 17   Ht 5\' 4"  (1.626 m)   Wt 54.7 kg   SpO2 96%   BMI 20.70 kg/m     A/P: No changes to current plan. Likely discharge tomorrow. See daily progress note.    Lyndee Hensen, DO PGY-3, [place] Intern pager [phone]

## 2021-01-30 NOTE — Discharge Summary (Signed)
[facility] Discharge Summary  Patient name: [name] [name] Medical record number: [id number] Date of birth: Aug 29, 1940 Age: 80 y.o. Gender: female Date of Admission: 01/24/2021  Date of Discharge: 01/30/21 Admitting Physician: Leeanne Rio, MD  Primary Care Provider: Maryella Shivers, MD Consultants: None  Indication for Hospitalization: Acute hypoxic respiratory failure and altered mental status  Discharge Diagnoses/Problem List:  Active Problems:   Sepsis ([place])   Healthcare-associated pneumonia   Hypernatremia   AKI (acute kidney injury) ([place])  Disposition: Home with Palliative care  Discharge Condition: Stable  Discharge Exam:  Blood pressure (!) 120/52, pulse 77, temperature 98.2 F (36.8 C), temperature source Axillary, resp. rate 20, height 5\' 4"  (1.626 m), weight 56.2 kg, SpO2 97 %. Physical Exam: General: Chronically ill appearing, no acute distress Cardiovascular: RRR, no murmurs Respiratory: intermittent productive cough with clear sputum, lungs are CTAB, on 2L [place] Abdomen: PEG tube in place without drainage, thin abdomen, non-distended and non-tender Extremities: Fail, no edema  Brief Hospital Course:  [name] [name] is a 80 y.o. female presenting with AMS in the context of multifocal PNA. PMH is significant for COPD, protein-calorie malnutrition with cahchexia, PEG tube in place, PVD, HLD, HTN, hypothyroidism. Her hospital course is detailed below.  Acute hypoxic respiratory failure in the context of multifocal PNA meeting sepsis criteria Patient presented meeting criteria for SIRS and sepsis with HR>90, WBC>12K, RR>20, and suspected source (CXR with multifocal PNA). Patient was started on 3L of oxygen through nasal canula. UA was unremarkable. Blood and urine cultures were collected and then Cefepime and Vancomycin were started on 9/11 for HAP. MRSA swab came back negative and Vancomycin was discontinued. Blood and urine cultures  demonstrated no growth during admission. Cefepime was continued throughout remainder of hospitalization and she finished a 7-day course by discharge. She returned to her baseline oxygen status prior to discharge.   Hypovolemic Hypernatremia Na was 170 on admission (max elevation 173), likely 2/2 being volume down due to sepsis. Patient was rehydrated with IVF and free water through her PEG. Na was corrected by less than 10 meq over a 24 hour period, until Na normalized on 9/16. Patient also had hyperchloremia, Cl > 130, on admission. ABG demonstrated normal pH and pCO2 with no concern for acidosis. Chloride improved by discharge.  She was discharged with instructions to continue 600 mL of free water per PEG tube q6h.   AKI in the setting of CKD  Patients baseline creatinine is 1.3, but was 2.76 on admission. Etiology was likely pre-renal in the setting of dehydration with a BUN to Cr ratio greater than 20. Patients Cr was improved to 1.92 on day of discharge.   Goals of Care Patient going home with palliative. Consider ongoing discussion on goals of care.  All other issues chronic and stable.  Issues for follow up:  Consider repeat CMP to check electrolytes, LFTs, and Cr.  Avoid nephrotoxic agents. TSH noted to be 32 during hospitalization, synthroid increased to 50. Recheck TSH in 4-6 weeks and titrate synthroid as needed Repeat albumin in 3-4 weeks to ensure patient is receiving adequate nutrition.  5. Patient developed aspiration pna due to end stage dementia and dysphagia which is unlikely to significantly resolve. Recommend continued [place] discussions in the outpatient setting.  Significant Procedures: None  Significant Labs and Imaging:  Recent Labs  Lab 01/25/21 0524 01/26/21 0122 01/26/21 1656 01/27/21 1039  WBC 13.1* 10.8*  --  7.7  HGB 8.1* 7.8* 9.4* 9.9*  HCT 29.6*  27.4* 31.3* 33.7*  PLT 128* 133*  --  163   Recent Labs  Lab 01/27/21 0231 01/27/21 0545 01/27/21 1249  01/28/21 0634 01/28/21 1244 01/29/21 0444 01/29/21 1203 01/30/21 0440 01/30/21 1749 01/31/21 0603  NA 157* 160*   < > 152*   < > 148* 144 146* 144 143  K 4.2 3.3*   < > 3.8   < > 3.7 3.9 3.9 3.9 4.3  CL >130* 125*   < > 119*   < > 117* 116* 115* 113* 114*  CO2 23 23   < > 23   < > 23 22 22 25 22   GLUCOSE 136* 160*   < > 146*   < > 145* 125* 99 110* 104*  BUN 53* 52*   < > 44*   < > 42* 42* 41* 41* 42*  CREATININE 2.39* 2.28*   < > 2.07*   < > 1.96* 1.88* 1.86* 1.92* 1.92*  CALCIUM 7.6* 8.0*   < > 8.0*   < > 7.6* 7.6* 7.9* 7.8* 7.9*  MG 3.1*  --   --   --   --   --   --   --   --   --   ALKPHOS 48 54  --   --   --  53  --  47  --  49  AST 60* 53*  --   --   --  64*  --  108*  --  134*  ALT 124* 116*  --   --   --  97*  --  119*  --  150*  ALBUMIN 2.2* 2.1*  --  2.1*  --  1.8*  --  1.8*  --  1.9*   < > = values in this interval not displayed.   Results/Tests Pending at Time of Discharge: none  Discharge Medications:  Allergies as of 01/31/2021   No Known Allergies      Medication List     STOP taking these medications    amLODipine 5 MG tablet Commonly known as: NORVASC   metoprolol tartrate 25 MG tablet Commonly known as: LOPRESSOR       TAKE these medications    albuterol 108 (90 Base) MCG/ACT inhaler Commonly known as: VENTOLIN HFA Inhale 2 puffs into the lungs every 6 (six) hours as needed.   feeding supplement (OSMOLITE 1.5 CAL) Liqd Place 296 mLs into feeding tube 4 (four) times daily -  with meals and at bedtime.   free water Soln Place 600 mLs into feeding tube every 6 (six) hours.   levothyroxine 50 MCG tablet Commonly known as: SYNTHROID Place 1 tablet (50 mcg total) into feeding tube daily. What changed:  medication strength how much to take   metoprolol succinate 100 MG 24 hr tablet Commonly known as: TOPROL-XL Take 1 tablet (100 mg total) by mouth daily. Take with or immediately following a meal.   polyethylene glycol 17 g packet Commonly  known as: MIRALAX / GLYCOLAX Place 17 g into feeding tube every other day.   rosuvastatin 40 MG tablet Commonly known as: CRESTOR Take 40 mg by mouth daily.   tiotropium 18 MCG inhalation capsule Commonly known as: SPIRIVA Place 18 mcg into inhaler and inhale daily.               Durable Medical Equipment  (From admission, onward)           Start     Ordered   01/31/21 1356  For home use only DME standard manual wheelchair with seat cushion  Once       Comments: Patient suffers from cachexia and weakness which impairs their ability to perform daily activities like bathing, dressing, feeding, grooming, and toileting in the home.  A walker will not resolve issue with performing activities of daily living. A wheelchair will allow patient to safely perform daily activities. Patient can safely propel the wheelchair in the home or has a caregiver who can provide assistance. Length of need Lifetime. Accessories: elevating leg rests (ELRs), wheel locks, extensions and anti-tippers.   01/31/21 1359   01/31/21 0000  For home use only DME wheelchair cushion (seat and back)        01/31/21 0446   01/29/21 1041  For home use only DME standard manual wheelchair with seat cushion  Once       Comments: Patient suffers from alzheimer's disease and is frail which impairs their ability to perform daily activities like bathing and dressing in the home.  A walker will not resolve issue with performing activities of daily living. A wheelchair will allow patient to safely perform daily activities. Patient can safely propel the wheelchair in the home or has a caregiver who can provide assistance. Length of need Lifetime. Accessories: elevating leg rests (ELRs), wheel locks, extensions and anti-tippers.   01/29/21 1042            Discharge Instructions: Please refer to Patient Instructions section of EMR for full details.  Patient was counseled important signs and symptoms that should prompt  return to medical care, changes in medications, dietary instructions, activity restrictions, and follow up appointments.   Follow-Up Appointments:  Follow-up Information     [facility] [place] Follow up.   Why: [facility] will contact you to resume home health services. Contact information: [place] [address] [phone]         [facility] Follow up.   Specialty: PALLIATIVE CARE Why: Palliative care will reach out to you to discuss services they can provide in the home. Contact information: [place] [phone] [phone]        Maryella Shivers, MD. Schedule an appointment as soon as possible for a visit.   Specialty: Family Medicine Why: [place]ae make an appointment for a hospital follow up at your earliest convenience. Contact information: [address] [place] [address] [phone]                 Gladys Damme, MD 01/31/2021, 2:06 PM PGY-3, [place]

## 2021-01-30 NOTE — Discharge Instructions (Addendum)
You were hospitalized at [facility] due to shortness of breath.  We expect this is from pneumonia which improved after antibiotic treatment. You also had a very increased sodium, an electrolyte, which improved after fluids. We are so glad you are feeling better.  Be sure to follow-up with your regularly scheduled appointments.  Please also be sure to follow-up with your PCP at your earliest convenience.  Thank you for allowing [place] to be a part of your medical care.  Take care, [facility] family medicine team

## 2021-01-30 NOTE — Progress Notes (Signed)
Family Medicine Teaching Service Daily Progress Note Intern Pager: [phone]  Patient name: [name] [name] Medical record number: [id number] Date of birth: 08/29/1940 Age: 80 y.o. Gender: female  Primary Care Provider: Maryella Shivers, MD Consultants: Palliative, Dietician Code Status: DNR/DNI  Pt Overview and Major Events to Date:  -Patient admitted overnight - 01/24/21  Assessment and Plan: Jelitza Manninen is a 80 y.o. female presenting with AMS in the context of multifocal PNA. PMH is significant for COPD, protein-calorie malnutrition with cahchexia, PEG tube in place, PVD, HLD, HTN, hypothyroidism.   AMS (resolved)  Acute hypoxic respiratory failure in the context of multifocal PNA meeting sepsis criteria -Completed cefepime for CAP, day 7 of 7 -Blood culture and urine culture: NGTD -On 1 L nasal cannula -Strict I's and O's -Daily weights -Up with assistance -Continuous pulse ox -Continuous cardiac monitoring  Goals of Care Family decided patient would receive home health with palliative care.   Hypovolemic Hypernatremia Sodium stabilized most recent sodium 146 today -Free water 600 cc daily every 6 hr -BMP BID   Hyperchloremia Chloride 115 today, patient chloride is trending down nicely and improved with improved volume status. Will continue to trend  Elevated LFTs LFTs trending down nearly normalized, demonstrating good response to fluid resuscitation -AST/ALT 108/119 [53/116 on 9/16], Alb 1.8, Tot Prot 5.1 -IV fluids as above -Continue to trend LFTs  COPD Requires O2 at night currently on 1 L nasal cannula -Continue Ventolin and Spiriva  Protein-calorie malnutrition Patient with cachexia, albumin of 1.8, Tot Prot 5.1 today, PEG tube in place from hospitalization in August, patient receiving feedings at goal. Patient transitioned to bolus feeds yesterday via PEG. -Provide 1.25 cartons (221ml) Osmolite 1.5 QID, flush with 56ml free water before and after each  bolus -Consider 115 ml free water flush QID for discharge, per nutrition note -Nutrition consult appreciate recs -SLP following -Check albumin in 2-3 weeks outpatient  AKI on CKD Creatinine 1.86 today BUN 41. Baseline Cr 1.3, down from 2.76 on admission -BMPs as above  HTN -Hold amlodipine 5 mg -Continue metoprolol succinate 100 mg daily  Hypothyroidism -Synthroid 50 -Recheck TSH in 4 weeks (02/27/21)  HLD -Continue home rosuvastatin 40 mg  Anemia Hemoglobin & hematocrit stable  Constipation MiraLAX every other day  FEN/GI: Tube Feeds PPx: Lovenox Dispo:Home today. Barriers include None.   Subjective:  Patient alert and interactive today, has no complaints of pain or respiratory distress. Notes that she had a little chest pain overnight, that has since resolved and is no longer a concern.  Objective: Temp:  [98.5 F (36.9 C)-99.3 F (37.4 C)] 98.6 F (37 C) (09/17 0754) Pulse Rate:  [9-87] 86 (09/17 0754) Resp:  [13-18] 18 (09/17 0754) BP: (129-153)/(40-69) 138/55 (09/17 0754) SpO2:  [92 %-98 %] 98 % (09/17 0841) Weight:  [54.7 kg] 54.7 kg (09/17 0405) Physical Exam: General: Frail, elderly, African American woman, alert and interactive Cardiovascular: RRR, NRMG Respiratory: CTABL, no wheezes or stridor Abdomen: Soft, non-tender, non-distended Extremities: Pulses intact in all extremities, no edema  Laboratory: Recent Labs  Lab 01/25/21 0524 01/26/21 0122 01/26/21 1656 01/27/21 1039  WBC 13.1* 10.8*  --  7.7  HGB 8.1* 7.8* 9.4* 9.9*  HCT 29.6* 27.4* 31.3* 33.7*  PLT 128* 133*  --  163   Recent Labs  Lab 01/27/21 0545 01/27/21 1249 01/29/21 0444 01/29/21 1203 01/30/21 0440  NA 160*   < > 148* 144 146*  K 3.3*   < > 3.7 3.9 3.9  CL 125*   < >  117* 116* 115*  CO2 23   < > 23 22 22   BUN 52*   < > 42* 42* 41*  CREATININE 2.28*   < > 1.96* 1.88* 1.86*  CALCIUM 8.0*   < > 7.6* 7.6* 7.9*  PROT 5.5*  --  5.0*  --  5.1*  BILITOT 0.8  --  0.3  --   0.4  ALKPHOS 54  --  53  --  47  ALT 116*  --  97*  --  119*  AST 53*  --  64*  --  108*  GLUCOSE 160*   < > 145* 125* 99   < > = values in this interval not displayed.    Imaging/Diagnostic Tests: None  Holley Bouche, MD 01/30/2021, 8:53 AM PGY-1, [place] Intern pager: [phone], text pages welcome

## 2021-01-31 DIAGNOSIS — R64 Cachexia: Secondary | ICD-10-CM

## 2021-01-31 DIAGNOSIS — R54 Age-related physical debility: Secondary | ICD-10-CM

## 2021-01-31 DIAGNOSIS — E43 Unspecified severe protein-calorie malnutrition: Secondary | ICD-10-CM

## 2021-01-31 DIAGNOSIS — R1314 Dysphagia, pharyngoesophageal phase: Secondary | ICD-10-CM

## 2021-01-31 LAB — BASIC METABOLIC PANEL
Anion gap: 7 (ref 5–15)
BUN: 42 mg/dL — ABNORMAL HIGH (ref 8–23)
CO2: 22 mmol/L (ref 22–32)
Calcium: 7.9 mg/dL — ABNORMAL LOW (ref 8.9–10.3)
Chloride: 114 mmol/L — ABNORMAL HIGH (ref 98–111)
Creatinine, Ser: 1.92 mg/dL — ABNORMAL HIGH (ref 0.44–1.00)
GFR, Estimated: 26 mL/min — ABNORMAL LOW (ref >60)
Glucose, Bld: 104 mg/dL — ABNORMAL HIGH (ref 70–99)
Potassium: 4.3 mmol/L (ref 3.5–5.1)
Sodium: 143 mmol/L (ref 135–145)

## 2021-01-31 LAB — HEPATIC FUNCTION PANEL
ALT: 150 U/L — ABNORMAL HIGH (ref 0–44)
AST: 134 U/L — ABNORMAL HIGH (ref 15–41)
Albumin: 1.9 g/dL — ABNORMAL LOW (ref 3.5–5.0)
Alkaline Phosphatase: 49 U/L (ref 38–126)
Bilirubin, Direct: <0.1 mg/dL (ref 0.0–0.2)
Total Bilirubin: 0.5 mg/dL (ref 0.3–1.2)
Total Protein: 5.3 g/dL — ABNORMAL LOW (ref 6.5–8.1)

## 2021-01-31 LAB — GLUCOSE, CAPILLARY
Glucose-Capillary: 109 mg/dL — ABNORMAL HIGH (ref 70–99)
Glucose-Capillary: 127 mg/dL — ABNORMAL HIGH (ref 70–99)
Glucose-Capillary: 136 mg/dL — ABNORMAL HIGH (ref 70–99)
Glucose-Capillary: 181 mg/dL — ABNORMAL HIGH (ref 70–99)
Glucose-Capillary: 99 mg/dL (ref 70–99)

## 2021-01-31 MED ORDER — LEVOTHYROXINE SODIUM 50 MCG PO TABS: 50.0000 ug | ORAL_TABLET | Freq: Every day | ORAL | 1 refills | Status: AC

## 2021-01-31 MED ORDER — OSMOLITE 1.5 CAL PO LIQD: 296.0000 mL | Freq: Three times a day (TID) | ORAL | 1 refills | Status: AC

## 2021-01-31 MED ORDER — FREE WATER: 600.0000 mL | Freq: Four times a day (QID) | 0 refills | Status: AC

## 2021-01-31 MED ORDER — POLYETHYLENE GLYCOL 3350 17 G PO PACK: 17.0000 g | PACK | ORAL | 0 refills | Status: DC

## 2021-01-31 MED ORDER — METOPROLOL SUCCINATE ER 100 MG PO TB24: 100.0000 mg | ORAL_TABLET | Freq: Every day | ORAL | 1 refills | Status: DC

## 2021-01-31 NOTE — Progress Notes (Signed)
Family Medicine Teaching Service Daily Progress Note Intern Pager: [phone]  Patient name: [name] [name] Medical record number: [id number] Date of birth: 1940-07-31 Age: 80 y.o. Gender: female  Primary Care Provider: Maryella Shivers, MD Consultants: Palliative  Code Status: DNR/DNI  Pt Overview and Major Events to Date:  Admitted 9/11; Na max 173  Assessment and Plan: [name] [name] is an 80 y.o. female who was admitted for altered mental status and found to have severe hypernatremia (Na max 173). Her mentation has improved as her sodium has decreased now to normal limits. She is currently completing treatment for multifocal pneumonia and stable for discharge. Her PMHx is significant for COPD, severe protein-calorie malnutrition with cachexia and EPG tube in place, HLD, HTN, hypothyroidism.    Altered Mental Status- Resolved Severe Hypovolemic Hypernatremia- Resolved Mentation has improved. She continues to receive free water and sodium has normalized.  -Continue 688mL of free water q6h via PEG on discharge  Multifocal Pneumonia: Improved  S/p 7-day Cefepime course. Respiratory status is stable. She intermittently uses oxygen, up to 2L [place].   Hyperchloremia: Improved Chloride >130 on admission. Has down-trended, now at 113.  AKI on CKD stage 3b: Improving Creatinine on admission 2.76. Baseline creatinine seems to be around 1.33. Creatinine has improved to 1.92.  -Continue free water as above -Continue tube feeds -Avoid nephrotoxic agents as able  -Appreciate dietician's assistance with this patient  COPD: Chronic, stable -Continue Ventolin and Spiriva   Protein-Calorie Malnutrition: Chronic, stable  Receives nutrition via PEG-tube.  -Continue tube feeds -Recheck Albumin in 2-3 weeks outpatient to monitor nutritional status  Hypertension: Chronic, stable  -Continue Metoprolol succinate 100 mg daily per tube  Hyperlipidemia: Chronic, stable -Continue rosuvastatin 40 mg  daily  Hypothyroidism: Chronic, stable -Continue Synthroid 50 mcg daily -Recheck TSH and 4 to 6 weeks outpatient  FEN/GI: Tube feeds PPx: Lovenox Dispo: Home with Palliative  today.    Subjective: She is very excited about the possibility of going home today.  She continues have a cough, on sure if it is better or worse.  She feels well otherwise.  Objective: Temp:  [97.6 F (36.4 C)-98.6 F (37 C)] 98.5 F (36.9 C) (09/18 0354) Pulse Rate:  [78-95] 78 (09/18 0200) Resp:  [15-25] 20 (09/18 0354) BP: (103-158)/(40-80) 121/68 (09/18 0354) SpO2:  [90 %-98 %] 98 % (09/18 0200) Weight:  [56.2 kg] 56.2 kg (09/18 0354) Physical Exam: General: Chronically ill appearing, no acute distress Cardiovascular: RRR, no murmurs Respiratory: intermittent productive cough with clear sputum, lungs are CTAB, on 2L [place] Abdomen: PEG tube in place without drainage, thin abdomen, non-distended and non-tender Extremities: Fail, no edema  Laboratory: Recent Labs  Lab 01/25/21 0524 01/26/21 0122 01/26/21 1656 01/27/21 1039  WBC 13.1* 10.8*  --  7.7  HGB 8.1* 7.8* 9.4* 9.9*  HCT 29.6* 27.4* 31.3* 33.7*  PLT 128* 133*  --  163   Recent Labs  Lab 01/27/21 0545 01/27/21 1249 01/29/21 0444 01/29/21 1203 01/30/21 0440 01/30/21 1749  NA 160*   < > 148* 144 146* 144  K 3.3*   < > 3.7 3.9 3.9 3.9  CL 125*   < > 117* 116* 115* 113*  CO2 23   < > 23 22 22 25   BUN 52*   < > 42* 42* 41* 41*  CREATININE 2.28*   < > 1.96* 1.88* 1.86* 1.92*  CALCIUM 8.0*   < > 7.6* 7.6* 7.9* 7.8*  PROT 5.5*  --  5.0*  --  5.1*  --  BILITOT 0.8  --  0.3  --  0.4  --   ALKPHOS 54  --  53  --  47  --   ALT 116*  --  97*  --  119*  --   AST 53*  --  64*  --  108*  --   GLUCOSE 160*   < > 145* 125* 99 110*   < > = values in this interval not displayed.    Imaging/Diagnostic Tests: No results found.  Sharion Settler, DO 01/31/2021, 4:55 AM PGY-2, [place] Intern pager: [phone], text  pages welcome

## 2021-01-31 NOTE — TOC Transition Note (Signed)
Transition of Care [facility]) - CM/SW Discharge Note   Patient Details  Name: [name] [name] MRN: [id number] Date of Birth: 27-Jun-1940  Transition of [facility]) CM/SW Contact:  Carles Collet, RN Phone Number: 01/31/2021, 2:21 PM   Clinical Narrative:   Confirmed w [facility] that patient is active for [facility] services, and they will resume at DC.       Barriers to Discharge: Continued Medical Work up   Patient Goals and CMS Choice   CMS Medicare.gov Compare Post Acute Care list provided to:: Patient Represenative (must comment) Choice offered to / list presented to : Adult Children  Discharge Placement                       Discharge Plan and Services     Post Acute Care Choice: Home Health          DME Arranged: N/A         HH Arranged: PT, OT HH Agency: [place] Date [place]: 01/29/21 Time [place]: [place] Representative spoke with at [place]: [place] Determinants of Health ([place]) Interventions     Readmission Risk Interventions No flowsheet data found.

## 2021-01-31 NOTE — TOC Transition Note (Signed)
Transition of Care [facility]) - CM/SW Discharge Note   Patient Details  Name: [name] [name] MRN: [id number] Date of Birth: January 14, 1941  Transition of [facility]) CM/SW Contact:  Coralee Pesa, [place] Phone Number: 01/31/2021, 3:54 PM   Clinical Narrative:     Pt to be transported home via [facility]. Daughter at bedside, made aware.   Final next level of care: Skilled Nursing Facility Barriers to Discharge: Barriers Resolved   Patient Goals and CMS Choice   CMS Medicare.gov Compare Post Acute Care list provided to:: Patient Represenative (must comment) Choice offered to / list presented to : Adult Children  Discharge Placement                Patient to be transferred to facility by: [place] Name of family member notified: Phylis Patient and family notified of of transfer: 01/31/21  Discharge Plan and Services     Post Acute Care Choice: Home Health          DME Arranged: N/A         HH Arranged: PT, OT HH Agency: [place] Date [place]: 01/29/21 Time [place]: 1322 Representative spoke with at [place]: [place] ([place]) Interventions     Readmission Risk Interventions No flowsheet data found.

## 2021-02-02 DIAGNOSIS — E871 Hypo-osmolality and hyponatremia: Secondary | ICD-10-CM | POA: Diagnosis not present

## 2021-02-02 DIAGNOSIS — Z431 Encounter for attention to gastrostomy: Secondary | ICD-10-CM | POA: Diagnosis not present

## 2021-02-02 DIAGNOSIS — E782 Mixed hyperlipidemia: Secondary | ICD-10-CM | POA: Diagnosis not present

## 2021-02-02 DIAGNOSIS — R1314 Dysphagia, pharyngoesophageal phase: Secondary | ICD-10-CM | POA: Diagnosis not present

## 2021-02-02 DIAGNOSIS — N184 Chronic kidney disease, stage 4 (severe): Secondary | ICD-10-CM | POA: Diagnosis not present

## 2021-02-02 DIAGNOSIS — I700 Atherosclerosis of aorta: Secondary | ICD-10-CM | POA: Diagnosis not present

## 2021-02-02 DIAGNOSIS — E039 Hypothyroidism, unspecified: Secondary | ICD-10-CM | POA: Diagnosis not present

## 2021-02-02 DIAGNOSIS — Z9981 Dependence on supplemental oxygen: Secondary | ICD-10-CM | POA: Diagnosis not present

## 2021-02-02 DIAGNOSIS — J440 Chronic obstructive pulmonary disease with acute lower respiratory infection: Secondary | ICD-10-CM | POA: Diagnosis not present

## 2021-02-02 DIAGNOSIS — J9601 Acute respiratory failure with hypoxia: Secondary | ICD-10-CM | POA: Diagnosis not present

## 2021-02-02 DIAGNOSIS — I739 Peripheral vascular disease, unspecified: Secondary | ICD-10-CM | POA: Diagnosis not present

## 2021-02-02 DIAGNOSIS — E43 Unspecified severe protein-calorie malnutrition: Secondary | ICD-10-CM | POA: Diagnosis not present

## 2021-02-02 DIAGNOSIS — J449 Chronic obstructive pulmonary disease, unspecified: Secondary | ICD-10-CM | POA: Diagnosis not present

## 2021-02-08 DIAGNOSIS — R1314 Dysphagia, pharyngoesophageal phase: Secondary | ICD-10-CM | POA: Diagnosis not present

## 2021-02-08 DIAGNOSIS — Z9981 Dependence on supplemental oxygen: Secondary | ICD-10-CM | POA: Diagnosis not present

## 2021-02-08 DIAGNOSIS — I700 Atherosclerosis of aorta: Secondary | ICD-10-CM | POA: Diagnosis not present

## 2021-02-08 DIAGNOSIS — J440 Chronic obstructive pulmonary disease with acute lower respiratory infection: Secondary | ICD-10-CM | POA: Diagnosis not present

## 2021-02-08 DIAGNOSIS — Z431 Encounter for attention to gastrostomy: Secondary | ICD-10-CM | POA: Diagnosis not present

## 2021-02-08 DIAGNOSIS — E039 Hypothyroidism, unspecified: Secondary | ICD-10-CM | POA: Diagnosis not present

## 2021-02-08 DIAGNOSIS — N184 Chronic kidney disease, stage 4 (severe): Secondary | ICD-10-CM | POA: Diagnosis not present

## 2021-02-08 DIAGNOSIS — E870 Hyperosmolality and hypernatremia: Secondary | ICD-10-CM | POA: Diagnosis not present

## 2021-02-08 DIAGNOSIS — J9601 Acute respiratory failure with hypoxia: Secondary | ICD-10-CM | POA: Diagnosis not present

## 2021-02-08 DIAGNOSIS — E871 Hypo-osmolality and hyponatremia: Secondary | ICD-10-CM | POA: Diagnosis not present

## 2021-02-08 DIAGNOSIS — J449 Chronic obstructive pulmonary disease, unspecified: Secondary | ICD-10-CM | POA: Diagnosis not present

## 2021-02-08 DIAGNOSIS — E782 Mixed hyperlipidemia: Secondary | ICD-10-CM | POA: Diagnosis not present

## 2021-02-08 DIAGNOSIS — I739 Peripheral vascular disease, unspecified: Secondary | ICD-10-CM | POA: Diagnosis not present

## 2021-02-08 DIAGNOSIS — Z931 Gastrostomy status: Secondary | ICD-10-CM | POA: Diagnosis not present

## 2021-02-08 DIAGNOSIS — Z23 Encounter for immunization: Secondary | ICD-10-CM | POA: Diagnosis not present

## 2021-02-08 DIAGNOSIS — E43 Unspecified severe protein-calorie malnutrition: Secondary | ICD-10-CM | POA: Diagnosis not present

## 2021-02-09 DIAGNOSIS — Z431 Encounter for attention to gastrostomy: Secondary | ICD-10-CM | POA: Diagnosis not present

## 2021-02-09 DIAGNOSIS — N184 Chronic kidney disease, stage 4 (severe): Secondary | ICD-10-CM | POA: Diagnosis not present

## 2021-02-09 DIAGNOSIS — J9601 Acute respiratory failure with hypoxia: Secondary | ICD-10-CM | POA: Diagnosis not present

## 2021-02-09 DIAGNOSIS — I700 Atherosclerosis of aorta: Secondary | ICD-10-CM | POA: Diagnosis not present

## 2021-02-09 DIAGNOSIS — R1314 Dysphagia, pharyngoesophageal phase: Secondary | ICD-10-CM | POA: Diagnosis not present

## 2021-02-09 DIAGNOSIS — E43 Unspecified severe protein-calorie malnutrition: Secondary | ICD-10-CM | POA: Diagnosis not present

## 2021-02-09 DIAGNOSIS — J449 Chronic obstructive pulmonary disease, unspecified: Secondary | ICD-10-CM | POA: Diagnosis not present

## 2021-02-09 DIAGNOSIS — E782 Mixed hyperlipidemia: Secondary | ICD-10-CM | POA: Diagnosis not present

## 2021-02-09 DIAGNOSIS — E871 Hypo-osmolality and hyponatremia: Secondary | ICD-10-CM | POA: Diagnosis not present

## 2021-02-09 DIAGNOSIS — E039 Hypothyroidism, unspecified: Secondary | ICD-10-CM | POA: Diagnosis not present

## 2021-02-09 DIAGNOSIS — J440 Chronic obstructive pulmonary disease with acute lower respiratory infection: Secondary | ICD-10-CM | POA: Diagnosis not present

## 2021-02-09 DIAGNOSIS — Z9981 Dependence on supplemental oxygen: Secondary | ICD-10-CM | POA: Diagnosis not present

## 2021-02-09 DIAGNOSIS — I739 Peripheral vascular disease, unspecified: Secondary | ICD-10-CM | POA: Diagnosis not present

## 2021-02-10 DIAGNOSIS — J9601 Acute respiratory failure with hypoxia: Secondary | ICD-10-CM | POA: Diagnosis not present

## 2021-02-10 DIAGNOSIS — Z431 Encounter for attention to gastrostomy: Secondary | ICD-10-CM | POA: Diagnosis not present

## 2021-02-10 DIAGNOSIS — R1314 Dysphagia, pharyngoesophageal phase: Secondary | ICD-10-CM | POA: Diagnosis not present

## 2021-02-10 DIAGNOSIS — E871 Hypo-osmolality and hyponatremia: Secondary | ICD-10-CM | POA: Diagnosis not present

## 2021-02-10 DIAGNOSIS — J440 Chronic obstructive pulmonary disease with acute lower respiratory infection: Secondary | ICD-10-CM | POA: Diagnosis not present

## 2021-02-10 DIAGNOSIS — E782 Mixed hyperlipidemia: Secondary | ICD-10-CM | POA: Diagnosis not present

## 2021-02-10 DIAGNOSIS — E43 Unspecified severe protein-calorie malnutrition: Secondary | ICD-10-CM | POA: Diagnosis not present

## 2021-02-10 DIAGNOSIS — E039 Hypothyroidism, unspecified: Secondary | ICD-10-CM | POA: Diagnosis not present

## 2021-02-10 DIAGNOSIS — J449 Chronic obstructive pulmonary disease, unspecified: Secondary | ICD-10-CM | POA: Diagnosis not present

## 2021-02-10 DIAGNOSIS — Z9981 Dependence on supplemental oxygen: Secondary | ICD-10-CM | POA: Diagnosis not present

## 2021-02-10 DIAGNOSIS — N184 Chronic kidney disease, stage 4 (severe): Secondary | ICD-10-CM | POA: Diagnosis not present

## 2021-02-10 DIAGNOSIS — I700 Atherosclerosis of aorta: Secondary | ICD-10-CM | POA: Diagnosis not present

## 2021-02-10 DIAGNOSIS — I739 Peripheral vascular disease, unspecified: Secondary | ICD-10-CM | POA: Diagnosis not present

## 2021-02-12 NOTE — Telephone Encounter (Signed)
error 

## 2021-02-13 DIAGNOSIS — J449 Chronic obstructive pulmonary disease, unspecified: Secondary | ICD-10-CM | POA: Diagnosis not present

## 2021-02-13 DIAGNOSIS — I129 Hypertensive chronic kidney disease with stage 1 through stage 4 chronic kidney disease, or unspecified chronic kidney disease: Secondary | ICD-10-CM | POA: Diagnosis not present

## 2021-02-15 DIAGNOSIS — E871 Hypo-osmolality and hyponatremia: Secondary | ICD-10-CM | POA: Diagnosis not present

## 2021-02-15 DIAGNOSIS — J440 Chronic obstructive pulmonary disease with acute lower respiratory infection: Secondary | ICD-10-CM | POA: Diagnosis not present

## 2021-02-15 DIAGNOSIS — R1314 Dysphagia, pharyngoesophageal phase: Secondary | ICD-10-CM | POA: Diagnosis not present

## 2021-02-15 DIAGNOSIS — Z431 Encounter for attention to gastrostomy: Secondary | ICD-10-CM | POA: Diagnosis not present

## 2021-02-15 DIAGNOSIS — J449 Chronic obstructive pulmonary disease, unspecified: Secondary | ICD-10-CM | POA: Diagnosis not present

## 2021-02-15 DIAGNOSIS — I700 Atherosclerosis of aorta: Secondary | ICD-10-CM | POA: Diagnosis not present

## 2021-02-15 DIAGNOSIS — Z9981 Dependence on supplemental oxygen: Secondary | ICD-10-CM | POA: Diagnosis not present

## 2021-02-15 DIAGNOSIS — E039 Hypothyroidism, unspecified: Secondary | ICD-10-CM | POA: Diagnosis not present

## 2021-02-15 DIAGNOSIS — J9601 Acute respiratory failure with hypoxia: Secondary | ICD-10-CM | POA: Diagnosis not present

## 2021-02-15 DIAGNOSIS — E870 Hyperosmolality and hypernatremia: Secondary | ICD-10-CM | POA: Diagnosis not present

## 2021-02-15 DIAGNOSIS — E43 Unspecified severe protein-calorie malnutrition: Secondary | ICD-10-CM | POA: Diagnosis not present

## 2021-02-15 DIAGNOSIS — N184 Chronic kidney disease, stage 4 (severe): Secondary | ICD-10-CM | POA: Diagnosis not present

## 2021-02-15 DIAGNOSIS — I739 Peripheral vascular disease, unspecified: Secondary | ICD-10-CM | POA: Diagnosis not present

## 2021-02-15 DIAGNOSIS — E782 Mixed hyperlipidemia: Secondary | ICD-10-CM | POA: Diagnosis not present

## 2021-02-17 DIAGNOSIS — J9601 Acute respiratory failure with hypoxia: Secondary | ICD-10-CM | POA: Diagnosis not present

## 2021-02-17 DIAGNOSIS — R1314 Dysphagia, pharyngoesophageal phase: Secondary | ICD-10-CM | POA: Diagnosis not present

## 2021-02-17 DIAGNOSIS — E782 Mixed hyperlipidemia: Secondary | ICD-10-CM | POA: Diagnosis not present

## 2021-02-17 DIAGNOSIS — Z431 Encounter for attention to gastrostomy: Secondary | ICD-10-CM | POA: Diagnosis not present

## 2021-02-17 DIAGNOSIS — Z9981 Dependence on supplemental oxygen: Secondary | ICD-10-CM | POA: Diagnosis not present

## 2021-02-17 DIAGNOSIS — J440 Chronic obstructive pulmonary disease with acute lower respiratory infection: Secondary | ICD-10-CM | POA: Diagnosis not present

## 2021-02-17 DIAGNOSIS — J449 Chronic obstructive pulmonary disease, unspecified: Secondary | ICD-10-CM | POA: Diagnosis not present

## 2021-02-17 DIAGNOSIS — N184 Chronic kidney disease, stage 4 (severe): Secondary | ICD-10-CM | POA: Diagnosis not present

## 2021-02-17 DIAGNOSIS — E43 Unspecified severe protein-calorie malnutrition: Secondary | ICD-10-CM | POA: Diagnosis not present

## 2021-02-17 DIAGNOSIS — E871 Hypo-osmolality and hyponatremia: Secondary | ICD-10-CM | POA: Diagnosis not present

## 2021-02-17 DIAGNOSIS — I739 Peripheral vascular disease, unspecified: Secondary | ICD-10-CM | POA: Diagnosis not present

## 2021-02-17 DIAGNOSIS — I700 Atherosclerosis of aorta: Secondary | ICD-10-CM | POA: Diagnosis not present

## 2021-02-17 DIAGNOSIS — E039 Hypothyroidism, unspecified: Secondary | ICD-10-CM | POA: Diagnosis not present

## 2021-02-18 DIAGNOSIS — J449 Chronic obstructive pulmonary disease, unspecified: Secondary | ICD-10-CM | POA: Diagnosis not present

## 2021-02-22 DIAGNOSIS — R1314 Dysphagia, pharyngoesophageal phase: Secondary | ICD-10-CM | POA: Diagnosis not present

## 2021-02-22 DIAGNOSIS — Z431 Encounter for attention to gastrostomy: Secondary | ICD-10-CM | POA: Diagnosis not present

## 2021-02-22 DIAGNOSIS — J449 Chronic obstructive pulmonary disease, unspecified: Secondary | ICD-10-CM | POA: Diagnosis not present

## 2021-02-22 DIAGNOSIS — E782 Mixed hyperlipidemia: Secondary | ICD-10-CM | POA: Diagnosis not present

## 2021-02-22 DIAGNOSIS — I739 Peripheral vascular disease, unspecified: Secondary | ICD-10-CM | POA: Diagnosis not present

## 2021-02-22 DIAGNOSIS — J9601 Acute respiratory failure with hypoxia: Secondary | ICD-10-CM | POA: Diagnosis not present

## 2021-02-22 DIAGNOSIS — J440 Chronic obstructive pulmonary disease with acute lower respiratory infection: Secondary | ICD-10-CM | POA: Diagnosis not present

## 2021-02-22 DIAGNOSIS — E039 Hypothyroidism, unspecified: Secondary | ICD-10-CM | POA: Diagnosis not present

## 2021-02-22 DIAGNOSIS — E871 Hypo-osmolality and hyponatremia: Secondary | ICD-10-CM | POA: Diagnosis not present

## 2021-02-22 DIAGNOSIS — Z9981 Dependence on supplemental oxygen: Secondary | ICD-10-CM | POA: Diagnosis not present

## 2021-02-22 DIAGNOSIS — N184 Chronic kidney disease, stage 4 (severe): Secondary | ICD-10-CM | POA: Diagnosis not present

## 2021-02-22 DIAGNOSIS — E43 Unspecified severe protein-calorie malnutrition: Secondary | ICD-10-CM | POA: Diagnosis not present

## 2021-02-22 DIAGNOSIS — I700 Atherosclerosis of aorta: Secondary | ICD-10-CM | POA: Diagnosis not present

## 2021-03-01 DIAGNOSIS — H1013 Acute atopic conjunctivitis, bilateral: Secondary | ICD-10-CM | POA: Diagnosis not present

## 2021-03-01 DIAGNOSIS — H35363 Drusen (degenerative) of macula, bilateral: Secondary | ICD-10-CM | POA: Diagnosis not present

## 2021-03-03 DIAGNOSIS — J9601 Acute respiratory failure with hypoxia: Secondary | ICD-10-CM | POA: Diagnosis not present

## 2021-03-03 DIAGNOSIS — J440 Chronic obstructive pulmonary disease with acute lower respiratory infection: Secondary | ICD-10-CM | POA: Diagnosis not present

## 2021-03-03 DIAGNOSIS — E43 Unspecified severe protein-calorie malnutrition: Secondary | ICD-10-CM | POA: Diagnosis not present

## 2021-03-03 DIAGNOSIS — R1314 Dysphagia, pharyngoesophageal phase: Secondary | ICD-10-CM | POA: Diagnosis not present

## 2021-03-03 DIAGNOSIS — E039 Hypothyroidism, unspecified: Secondary | ICD-10-CM | POA: Diagnosis not present

## 2021-03-03 DIAGNOSIS — Z9981 Dependence on supplemental oxygen: Secondary | ICD-10-CM | POA: Diagnosis not present

## 2021-03-03 DIAGNOSIS — Z431 Encounter for attention to gastrostomy: Secondary | ICD-10-CM | POA: Diagnosis not present

## 2021-03-03 DIAGNOSIS — N184 Chronic kidney disease, stage 4 (severe): Secondary | ICD-10-CM | POA: Diagnosis not present

## 2021-03-03 DIAGNOSIS — I700 Atherosclerosis of aorta: Secondary | ICD-10-CM | POA: Diagnosis not present

## 2021-03-03 DIAGNOSIS — I739 Peripheral vascular disease, unspecified: Secondary | ICD-10-CM | POA: Diagnosis not present

## 2021-03-03 DIAGNOSIS — E782 Mixed hyperlipidemia: Secondary | ICD-10-CM | POA: Diagnosis not present

## 2021-03-03 DIAGNOSIS — E871 Hypo-osmolality and hyponatremia: Secondary | ICD-10-CM | POA: Diagnosis not present

## 2021-03-03 DIAGNOSIS — J449 Chronic obstructive pulmonary disease, unspecified: Secondary | ICD-10-CM | POA: Diagnosis not present

## 2021-03-04 DIAGNOSIS — E43 Unspecified severe protein-calorie malnutrition: Secondary | ICD-10-CM | POA: Diagnosis not present

## 2021-03-04 DIAGNOSIS — E871 Hypo-osmolality and hyponatremia: Secondary | ICD-10-CM | POA: Diagnosis not present

## 2021-03-04 DIAGNOSIS — R1314 Dysphagia, pharyngoesophageal phase: Secondary | ICD-10-CM | POA: Diagnosis not present

## 2021-03-04 DIAGNOSIS — E782 Mixed hyperlipidemia: Secondary | ICD-10-CM | POA: Diagnosis not present

## 2021-03-04 DIAGNOSIS — J449 Chronic obstructive pulmonary disease, unspecified: Secondary | ICD-10-CM | POA: Diagnosis not present

## 2021-03-04 DIAGNOSIS — J440 Chronic obstructive pulmonary disease with acute lower respiratory infection: Secondary | ICD-10-CM | POA: Diagnosis not present

## 2021-03-04 DIAGNOSIS — E039 Hypothyroidism, unspecified: Secondary | ICD-10-CM | POA: Diagnosis not present

## 2021-03-04 DIAGNOSIS — I700 Atherosclerosis of aorta: Secondary | ICD-10-CM | POA: Diagnosis not present

## 2021-03-04 DIAGNOSIS — N184 Chronic kidney disease, stage 4 (severe): Secondary | ICD-10-CM | POA: Diagnosis not present

## 2021-03-04 DIAGNOSIS — I739 Peripheral vascular disease, unspecified: Secondary | ICD-10-CM | POA: Diagnosis not present

## 2021-03-04 DIAGNOSIS — J9601 Acute respiratory failure with hypoxia: Secondary | ICD-10-CM | POA: Diagnosis not present

## 2021-03-04 DIAGNOSIS — Z431 Encounter for attention to gastrostomy: Secondary | ICD-10-CM | POA: Diagnosis not present

## 2021-03-04 DIAGNOSIS — Z9981 Dependence on supplemental oxygen: Secondary | ICD-10-CM | POA: Diagnosis not present

## 2021-03-05 DIAGNOSIS — M6259 Muscle wasting and atrophy, not elsewhere classified, multiple sites: Secondary | ICD-10-CM | POA: Diagnosis not present

## 2021-03-05 DIAGNOSIS — Z931 Gastrostomy status: Secondary | ICD-10-CM | POA: Diagnosis not present

## 2021-03-05 DIAGNOSIS — R2689 Other abnormalities of gait and mobility: Secondary | ICD-10-CM | POA: Diagnosis not present

## 2021-03-05 DIAGNOSIS — J449 Chronic obstructive pulmonary disease, unspecified: Secondary | ICD-10-CM | POA: Diagnosis not present

## 2021-03-10 DIAGNOSIS — R1314 Dysphagia, pharyngoesophageal phase: Secondary | ICD-10-CM | POA: Diagnosis not present

## 2021-03-10 DIAGNOSIS — I739 Peripheral vascular disease, unspecified: Secondary | ICD-10-CM | POA: Diagnosis not present

## 2021-03-10 DIAGNOSIS — E43 Unspecified severe protein-calorie malnutrition: Secondary | ICD-10-CM | POA: Diagnosis not present

## 2021-03-10 DIAGNOSIS — J440 Chronic obstructive pulmonary disease with acute lower respiratory infection: Secondary | ICD-10-CM | POA: Diagnosis not present

## 2021-03-10 DIAGNOSIS — Z9981 Dependence on supplemental oxygen: Secondary | ICD-10-CM | POA: Diagnosis not present

## 2021-03-10 DIAGNOSIS — E782 Mixed hyperlipidemia: Secondary | ICD-10-CM | POA: Diagnosis not present

## 2021-03-10 DIAGNOSIS — J449 Chronic obstructive pulmonary disease, unspecified: Secondary | ICD-10-CM | POA: Diagnosis not present

## 2021-03-10 DIAGNOSIS — E871 Hypo-osmolality and hyponatremia: Secondary | ICD-10-CM | POA: Diagnosis not present

## 2021-03-10 DIAGNOSIS — J9601 Acute respiratory failure with hypoxia: Secondary | ICD-10-CM | POA: Diagnosis not present

## 2021-03-10 DIAGNOSIS — E039 Hypothyroidism, unspecified: Secondary | ICD-10-CM | POA: Diagnosis not present

## 2021-03-10 DIAGNOSIS — Z431 Encounter for attention to gastrostomy: Secondary | ICD-10-CM | POA: Diagnosis not present

## 2021-03-10 DIAGNOSIS — N184 Chronic kidney disease, stage 4 (severe): Secondary | ICD-10-CM | POA: Diagnosis not present

## 2021-03-10 DIAGNOSIS — I700 Atherosclerosis of aorta: Secondary | ICD-10-CM | POA: Diagnosis not present

## 2021-03-11 DIAGNOSIS — Z431 Encounter for attention to gastrostomy: Secondary | ICD-10-CM | POA: Diagnosis not present

## 2021-03-11 DIAGNOSIS — J9601 Acute respiratory failure with hypoxia: Secondary | ICD-10-CM | POA: Diagnosis not present

## 2021-03-11 DIAGNOSIS — E782 Mixed hyperlipidemia: Secondary | ICD-10-CM | POA: Diagnosis not present

## 2021-03-11 DIAGNOSIS — N184 Chronic kidney disease, stage 4 (severe): Secondary | ICD-10-CM | POA: Diagnosis not present

## 2021-03-11 DIAGNOSIS — E43 Unspecified severe protein-calorie malnutrition: Secondary | ICD-10-CM | POA: Diagnosis not present

## 2021-03-11 DIAGNOSIS — J440 Chronic obstructive pulmonary disease with acute lower respiratory infection: Secondary | ICD-10-CM | POA: Diagnosis not present

## 2021-03-11 DIAGNOSIS — R1314 Dysphagia, pharyngoesophageal phase: Secondary | ICD-10-CM | POA: Diagnosis not present

## 2021-03-11 DIAGNOSIS — Z9981 Dependence on supplemental oxygen: Secondary | ICD-10-CM | POA: Diagnosis not present

## 2021-03-11 DIAGNOSIS — E039 Hypothyroidism, unspecified: Secondary | ICD-10-CM | POA: Diagnosis not present

## 2021-03-11 DIAGNOSIS — E871 Hypo-osmolality and hyponatremia: Secondary | ICD-10-CM | POA: Diagnosis not present

## 2021-03-11 DIAGNOSIS — J449 Chronic obstructive pulmonary disease, unspecified: Secondary | ICD-10-CM | POA: Diagnosis not present

## 2021-03-11 DIAGNOSIS — I700 Atherosclerosis of aorta: Secondary | ICD-10-CM | POA: Diagnosis not present

## 2021-03-11 DIAGNOSIS — I739 Peripheral vascular disease, unspecified: Secondary | ICD-10-CM | POA: Diagnosis not present

## 2021-03-16 DIAGNOSIS — I129 Hypertensive chronic kidney disease with stage 1 through stage 4 chronic kidney disease, or unspecified chronic kidney disease: Secondary | ICD-10-CM | POA: Diagnosis not present

## 2021-03-16 DIAGNOSIS — J449 Chronic obstructive pulmonary disease, unspecified: Secondary | ICD-10-CM | POA: Diagnosis not present

## 2021-03-17 DIAGNOSIS — I739 Peripheral vascular disease, unspecified: Secondary | ICD-10-CM | POA: Diagnosis not present

## 2021-03-17 DIAGNOSIS — Z431 Encounter for attention to gastrostomy: Secondary | ICD-10-CM | POA: Diagnosis not present

## 2021-03-17 DIAGNOSIS — N184 Chronic kidney disease, stage 4 (severe): Secondary | ICD-10-CM | POA: Diagnosis not present

## 2021-03-17 DIAGNOSIS — E871 Hypo-osmolality and hyponatremia: Secondary | ICD-10-CM | POA: Diagnosis not present

## 2021-03-17 DIAGNOSIS — E039 Hypothyroidism, unspecified: Secondary | ICD-10-CM | POA: Diagnosis not present

## 2021-03-17 DIAGNOSIS — J9601 Acute respiratory failure with hypoxia: Secondary | ICD-10-CM | POA: Diagnosis not present

## 2021-03-17 DIAGNOSIS — E782 Mixed hyperlipidemia: Secondary | ICD-10-CM | POA: Diagnosis not present

## 2021-03-17 DIAGNOSIS — E43 Unspecified severe protein-calorie malnutrition: Secondary | ICD-10-CM | POA: Diagnosis not present

## 2021-03-17 DIAGNOSIS — J449 Chronic obstructive pulmonary disease, unspecified: Secondary | ICD-10-CM | POA: Diagnosis not present

## 2021-03-17 DIAGNOSIS — I700 Atherosclerosis of aorta: Secondary | ICD-10-CM | POA: Diagnosis not present

## 2021-03-17 DIAGNOSIS — Z9981 Dependence on supplemental oxygen: Secondary | ICD-10-CM | POA: Diagnosis not present

## 2021-03-17 DIAGNOSIS — R1314 Dysphagia, pharyngoesophageal phase: Secondary | ICD-10-CM | POA: Diagnosis not present

## 2021-03-18 DIAGNOSIS — I739 Peripheral vascular disease, unspecified: Secondary | ICD-10-CM | POA: Diagnosis not present

## 2021-03-18 DIAGNOSIS — E43 Unspecified severe protein-calorie malnutrition: Secondary | ICD-10-CM | POA: Diagnosis not present

## 2021-03-18 DIAGNOSIS — E871 Hypo-osmolality and hyponatremia: Secondary | ICD-10-CM | POA: Diagnosis not present

## 2021-03-18 DIAGNOSIS — J449 Chronic obstructive pulmonary disease, unspecified: Secondary | ICD-10-CM | POA: Diagnosis not present

## 2021-03-18 DIAGNOSIS — E039 Hypothyroidism, unspecified: Secondary | ICD-10-CM | POA: Diagnosis not present

## 2021-03-18 DIAGNOSIS — J9601 Acute respiratory failure with hypoxia: Secondary | ICD-10-CM | POA: Diagnosis not present

## 2021-03-18 DIAGNOSIS — I700 Atherosclerosis of aorta: Secondary | ICD-10-CM | POA: Diagnosis not present

## 2021-03-18 DIAGNOSIS — Z9981 Dependence on supplemental oxygen: Secondary | ICD-10-CM | POA: Diagnosis not present

## 2021-03-18 DIAGNOSIS — E782 Mixed hyperlipidemia: Secondary | ICD-10-CM | POA: Diagnosis not present

## 2021-03-18 DIAGNOSIS — R1314 Dysphagia, pharyngoesophageal phase: Secondary | ICD-10-CM | POA: Diagnosis not present

## 2021-03-18 DIAGNOSIS — N184 Chronic kidney disease, stage 4 (severe): Secondary | ICD-10-CM | POA: Diagnosis not present

## 2021-03-18 DIAGNOSIS — Z431 Encounter for attention to gastrostomy: Secondary | ICD-10-CM | POA: Diagnosis not present

## 2021-03-21 DIAGNOSIS — J449 Chronic obstructive pulmonary disease, unspecified: Secondary | ICD-10-CM | POA: Diagnosis not present

## 2021-03-22 DIAGNOSIS — I129 Hypertensive chronic kidney disease with stage 1 through stage 4 chronic kidney disease, or unspecified chronic kidney disease: Secondary | ICD-10-CM | POA: Diagnosis not present

## 2021-03-22 DIAGNOSIS — E039 Hypothyroidism, unspecified: Secondary | ICD-10-CM | POA: Diagnosis not present

## 2021-03-25 DIAGNOSIS — Z9981 Dependence on supplemental oxygen: Secondary | ICD-10-CM | POA: Diagnosis not present

## 2021-03-25 DIAGNOSIS — N184 Chronic kidney disease, stage 4 (severe): Secondary | ICD-10-CM | POA: Diagnosis not present

## 2021-03-25 DIAGNOSIS — I739 Peripheral vascular disease, unspecified: Secondary | ICD-10-CM | POA: Diagnosis not present

## 2021-03-25 DIAGNOSIS — Z431 Encounter for attention to gastrostomy: Secondary | ICD-10-CM | POA: Diagnosis not present

## 2021-03-25 DIAGNOSIS — J9601 Acute respiratory failure with hypoxia: Secondary | ICD-10-CM | POA: Diagnosis not present

## 2021-03-25 DIAGNOSIS — J449 Chronic obstructive pulmonary disease, unspecified: Secondary | ICD-10-CM | POA: Diagnosis not present

## 2021-03-25 DIAGNOSIS — E43 Unspecified severe protein-calorie malnutrition: Secondary | ICD-10-CM | POA: Diagnosis not present

## 2021-03-25 DIAGNOSIS — I700 Atherosclerosis of aorta: Secondary | ICD-10-CM | POA: Diagnosis not present

## 2021-03-25 DIAGNOSIS — E039 Hypothyroidism, unspecified: Secondary | ICD-10-CM | POA: Diagnosis not present

## 2021-03-25 DIAGNOSIS — E782 Mixed hyperlipidemia: Secondary | ICD-10-CM | POA: Diagnosis not present

## 2021-03-25 DIAGNOSIS — R1314 Dysphagia, pharyngoesophageal phase: Secondary | ICD-10-CM | POA: Diagnosis not present

## 2021-03-25 DIAGNOSIS — E871 Hypo-osmolality and hyponatremia: Secondary | ICD-10-CM | POA: Diagnosis not present

## 2021-03-26 DIAGNOSIS — E782 Mixed hyperlipidemia: Secondary | ICD-10-CM | POA: Diagnosis not present

## 2021-03-26 DIAGNOSIS — R1314 Dysphagia, pharyngoesophageal phase: Secondary | ICD-10-CM | POA: Diagnosis not present

## 2021-03-26 DIAGNOSIS — N184 Chronic kidney disease, stage 4 (severe): Secondary | ICD-10-CM | POA: Diagnosis not present

## 2021-03-26 DIAGNOSIS — E871 Hypo-osmolality and hyponatremia: Secondary | ICD-10-CM | POA: Diagnosis not present

## 2021-03-26 DIAGNOSIS — I700 Atherosclerosis of aorta: Secondary | ICD-10-CM | POA: Diagnosis not present

## 2021-03-26 DIAGNOSIS — I739 Peripheral vascular disease, unspecified: Secondary | ICD-10-CM | POA: Diagnosis not present

## 2021-03-26 DIAGNOSIS — J449 Chronic obstructive pulmonary disease, unspecified: Secondary | ICD-10-CM | POA: Diagnosis not present

## 2021-03-26 DIAGNOSIS — Z431 Encounter for attention to gastrostomy: Secondary | ICD-10-CM | POA: Diagnosis not present

## 2021-03-26 DIAGNOSIS — J9601 Acute respiratory failure with hypoxia: Secondary | ICD-10-CM | POA: Diagnosis not present

## 2021-03-26 DIAGNOSIS — E43 Unspecified severe protein-calorie malnutrition: Secondary | ICD-10-CM | POA: Diagnosis not present

## 2021-03-26 DIAGNOSIS — Z9981 Dependence on supplemental oxygen: Secondary | ICD-10-CM | POA: Diagnosis not present

## 2021-03-26 DIAGNOSIS — E039 Hypothyroidism, unspecified: Secondary | ICD-10-CM | POA: Diagnosis not present

## 2021-03-27 ENCOUNTER — Other Ambulatory Visit: Payer: Self-pay | Admitting: Family Medicine

## 2021-03-29 DIAGNOSIS — I10 Essential (primary) hypertension: Secondary | ICD-10-CM | POA: Diagnosis not present

## 2021-03-29 DIAGNOSIS — E43 Unspecified severe protein-calorie malnutrition: Secondary | ICD-10-CM | POA: Diagnosis not present

## 2021-03-29 DIAGNOSIS — J9601 Acute respiratory failure with hypoxia: Secondary | ICD-10-CM | POA: Diagnosis not present

## 2021-03-29 DIAGNOSIS — J449 Chronic obstructive pulmonary disease, unspecified: Secondary | ICD-10-CM | POA: Diagnosis not present

## 2021-03-29 DIAGNOSIS — Z9981 Dependence on supplemental oxygen: Secondary | ICD-10-CM | POA: Diagnosis not present

## 2021-03-29 DIAGNOSIS — E782 Mixed hyperlipidemia: Secondary | ICD-10-CM | POA: Diagnosis not present

## 2021-03-29 DIAGNOSIS — R1314 Dysphagia, pharyngoesophageal phase: Secondary | ICD-10-CM | POA: Diagnosis not present

## 2021-03-29 DIAGNOSIS — I739 Peripheral vascular disease, unspecified: Secondary | ICD-10-CM | POA: Diagnosis not present

## 2021-03-29 DIAGNOSIS — E871 Hypo-osmolality and hyponatremia: Secondary | ICD-10-CM | POA: Diagnosis not present

## 2021-03-29 DIAGNOSIS — Z431 Encounter for attention to gastrostomy: Secondary | ICD-10-CM | POA: Diagnosis not present

## 2021-03-29 DIAGNOSIS — I700 Atherosclerosis of aorta: Secondary | ICD-10-CM | POA: Diagnosis not present

## 2021-03-29 DIAGNOSIS — N184 Chronic kidney disease, stage 4 (severe): Secondary | ICD-10-CM | POA: Diagnosis not present

## 2021-03-29 DIAGNOSIS — E039 Hypothyroidism, unspecified: Secondary | ICD-10-CM | POA: Diagnosis not present

## 2021-03-30 DIAGNOSIS — I739 Peripheral vascular disease, unspecified: Secondary | ICD-10-CM | POA: Diagnosis not present

## 2021-03-30 DIAGNOSIS — E871 Hypo-osmolality and hyponatremia: Secondary | ICD-10-CM | POA: Diagnosis not present

## 2021-03-30 DIAGNOSIS — E43 Unspecified severe protein-calorie malnutrition: Secondary | ICD-10-CM | POA: Diagnosis not present

## 2021-03-30 DIAGNOSIS — Z431 Encounter for attention to gastrostomy: Secondary | ICD-10-CM | POA: Diagnosis not present

## 2021-03-30 DIAGNOSIS — N184 Chronic kidney disease, stage 4 (severe): Secondary | ICD-10-CM | POA: Diagnosis not present

## 2021-03-30 DIAGNOSIS — I700 Atherosclerosis of aorta: Secondary | ICD-10-CM | POA: Diagnosis not present

## 2021-03-30 DIAGNOSIS — R1314 Dysphagia, pharyngoesophageal phase: Secondary | ICD-10-CM | POA: Diagnosis not present

## 2021-03-30 DIAGNOSIS — E782 Mixed hyperlipidemia: Secondary | ICD-10-CM | POA: Diagnosis not present

## 2021-03-30 DIAGNOSIS — J449 Chronic obstructive pulmonary disease, unspecified: Secondary | ICD-10-CM | POA: Diagnosis not present

## 2021-03-30 DIAGNOSIS — Z9981 Dependence on supplemental oxygen: Secondary | ICD-10-CM | POA: Diagnosis not present

## 2021-03-30 DIAGNOSIS — J9601 Acute respiratory failure with hypoxia: Secondary | ICD-10-CM | POA: Diagnosis not present

## 2021-03-30 DIAGNOSIS — E039 Hypothyroidism, unspecified: Secondary | ICD-10-CM | POA: Diagnosis not present

## 2021-03-31 DIAGNOSIS — J449 Chronic obstructive pulmonary disease, unspecified: Secondary | ICD-10-CM | POA: Diagnosis not present

## 2021-03-31 DIAGNOSIS — J9601 Acute respiratory failure with hypoxia: Secondary | ICD-10-CM | POA: Diagnosis not present

## 2021-03-31 DIAGNOSIS — Z9981 Dependence on supplemental oxygen: Secondary | ICD-10-CM | POA: Diagnosis not present

## 2021-03-31 DIAGNOSIS — E43 Unspecified severe protein-calorie malnutrition: Secondary | ICD-10-CM | POA: Diagnosis not present

## 2021-03-31 DIAGNOSIS — E782 Mixed hyperlipidemia: Secondary | ICD-10-CM | POA: Diagnosis not present

## 2021-03-31 DIAGNOSIS — E871 Hypo-osmolality and hyponatremia: Secondary | ICD-10-CM | POA: Diagnosis not present

## 2021-03-31 DIAGNOSIS — I739 Peripheral vascular disease, unspecified: Secondary | ICD-10-CM | POA: Diagnosis not present

## 2021-03-31 DIAGNOSIS — N184 Chronic kidney disease, stage 4 (severe): Secondary | ICD-10-CM | POA: Diagnosis not present

## 2021-03-31 DIAGNOSIS — R1314 Dysphagia, pharyngoesophageal phase: Secondary | ICD-10-CM | POA: Diagnosis not present

## 2021-03-31 DIAGNOSIS — E039 Hypothyroidism, unspecified: Secondary | ICD-10-CM | POA: Diagnosis not present

## 2021-03-31 DIAGNOSIS — Z431 Encounter for attention to gastrostomy: Secondary | ICD-10-CM | POA: Diagnosis not present

## 2021-03-31 DIAGNOSIS — I700 Atherosclerosis of aorta: Secondary | ICD-10-CM | POA: Diagnosis not present

## 2021-04-05 DIAGNOSIS — E039 Hypothyroidism, unspecified: Secondary | ICD-10-CM | POA: Diagnosis not present

## 2021-04-05 DIAGNOSIS — Z9981 Dependence on supplemental oxygen: Secondary | ICD-10-CM | POA: Diagnosis not present

## 2021-04-05 DIAGNOSIS — I739 Peripheral vascular disease, unspecified: Secondary | ICD-10-CM | POA: Diagnosis not present

## 2021-04-05 DIAGNOSIS — R1314 Dysphagia, pharyngoesophageal phase: Secondary | ICD-10-CM | POA: Diagnosis not present

## 2021-04-05 DIAGNOSIS — E871 Hypo-osmolality and hyponatremia: Secondary | ICD-10-CM | POA: Diagnosis not present

## 2021-04-05 DIAGNOSIS — J449 Chronic obstructive pulmonary disease, unspecified: Secondary | ICD-10-CM | POA: Diagnosis not present

## 2021-04-05 DIAGNOSIS — N184 Chronic kidney disease, stage 4 (severe): Secondary | ICD-10-CM | POA: Diagnosis not present

## 2021-04-05 DIAGNOSIS — I700 Atherosclerosis of aorta: Secondary | ICD-10-CM | POA: Diagnosis not present

## 2021-04-05 DIAGNOSIS — E782 Mixed hyperlipidemia: Secondary | ICD-10-CM | POA: Diagnosis not present

## 2021-04-05 DIAGNOSIS — E43 Unspecified severe protein-calorie malnutrition: Secondary | ICD-10-CM | POA: Diagnosis not present

## 2021-04-05 DIAGNOSIS — J9601 Acute respiratory failure with hypoxia: Secondary | ICD-10-CM | POA: Diagnosis not present

## 2021-04-05 DIAGNOSIS — Z431 Encounter for attention to gastrostomy: Secondary | ICD-10-CM | POA: Diagnosis not present

## 2021-04-07 DIAGNOSIS — M6259 Muscle wasting and atrophy, not elsewhere classified, multiple sites: Secondary | ICD-10-CM | POA: Diagnosis not present

## 2021-04-07 DIAGNOSIS — J449 Chronic obstructive pulmonary disease, unspecified: Secondary | ICD-10-CM | POA: Diagnosis not present

## 2021-04-07 DIAGNOSIS — E039 Hypothyroidism, unspecified: Secondary | ICD-10-CM | POA: Diagnosis not present

## 2021-04-07 DIAGNOSIS — E871 Hypo-osmolality and hyponatremia: Secondary | ICD-10-CM | POA: Diagnosis not present

## 2021-04-07 DIAGNOSIS — R1314 Dysphagia, pharyngoesophageal phase: Secondary | ICD-10-CM | POA: Diagnosis not present

## 2021-04-07 DIAGNOSIS — R2689 Other abnormalities of gait and mobility: Secondary | ICD-10-CM | POA: Diagnosis not present

## 2021-04-07 DIAGNOSIS — I739 Peripheral vascular disease, unspecified: Secondary | ICD-10-CM | POA: Diagnosis not present

## 2021-04-07 DIAGNOSIS — Z931 Gastrostomy status: Secondary | ICD-10-CM | POA: Diagnosis not present

## 2021-04-07 DIAGNOSIS — E43 Unspecified severe protein-calorie malnutrition: Secondary | ICD-10-CM | POA: Diagnosis not present

## 2021-04-07 DIAGNOSIS — J9601 Acute respiratory failure with hypoxia: Secondary | ICD-10-CM | POA: Diagnosis not present

## 2021-04-07 DIAGNOSIS — E782 Mixed hyperlipidemia: Secondary | ICD-10-CM | POA: Diagnosis not present

## 2021-04-07 DIAGNOSIS — I700 Atherosclerosis of aorta: Secondary | ICD-10-CM | POA: Diagnosis not present

## 2021-04-07 DIAGNOSIS — Z9981 Dependence on supplemental oxygen: Secondary | ICD-10-CM | POA: Diagnosis not present

## 2021-04-07 DIAGNOSIS — N184 Chronic kidney disease, stage 4 (severe): Secondary | ICD-10-CM | POA: Diagnosis not present

## 2021-04-07 DIAGNOSIS — Z431 Encounter for attention to gastrostomy: Secondary | ICD-10-CM | POA: Diagnosis not present

## 2021-04-14 DIAGNOSIS — J449 Chronic obstructive pulmonary disease, unspecified: Secondary | ICD-10-CM | POA: Diagnosis not present

## 2021-04-14 DIAGNOSIS — Z9981 Dependence on supplemental oxygen: Secondary | ICD-10-CM | POA: Diagnosis not present

## 2021-04-14 DIAGNOSIS — E782 Mixed hyperlipidemia: Secondary | ICD-10-CM | POA: Diagnosis not present

## 2021-04-14 DIAGNOSIS — E039 Hypothyroidism, unspecified: Secondary | ICD-10-CM | POA: Diagnosis not present

## 2021-04-14 DIAGNOSIS — Z431 Encounter for attention to gastrostomy: Secondary | ICD-10-CM | POA: Diagnosis not present

## 2021-04-14 DIAGNOSIS — I700 Atherosclerosis of aorta: Secondary | ICD-10-CM | POA: Diagnosis not present

## 2021-04-14 DIAGNOSIS — R1314 Dysphagia, pharyngoesophageal phase: Secondary | ICD-10-CM | POA: Diagnosis not present

## 2021-04-14 DIAGNOSIS — E871 Hypo-osmolality and hyponatremia: Secondary | ICD-10-CM | POA: Diagnosis not present

## 2021-04-14 DIAGNOSIS — N184 Chronic kidney disease, stage 4 (severe): Secondary | ICD-10-CM | POA: Diagnosis not present

## 2021-04-14 DIAGNOSIS — J9601 Acute respiratory failure with hypoxia: Secondary | ICD-10-CM | POA: Diagnosis not present

## 2021-04-14 DIAGNOSIS — E43 Unspecified severe protein-calorie malnutrition: Secondary | ICD-10-CM | POA: Diagnosis not present

## 2021-04-14 DIAGNOSIS — I739 Peripheral vascular disease, unspecified: Secondary | ICD-10-CM | POA: Diagnosis not present

## 2021-04-15 DIAGNOSIS — E871 Hypo-osmolality and hyponatremia: Secondary | ICD-10-CM | POA: Diagnosis not present

## 2021-04-15 DIAGNOSIS — E782 Mixed hyperlipidemia: Secondary | ICD-10-CM | POA: Diagnosis not present

## 2021-04-15 DIAGNOSIS — R1314 Dysphagia, pharyngoesophageal phase: Secondary | ICD-10-CM | POA: Diagnosis not present

## 2021-04-15 DIAGNOSIS — J449 Chronic obstructive pulmonary disease, unspecified: Secondary | ICD-10-CM | POA: Diagnosis not present

## 2021-04-15 DIAGNOSIS — Z9981 Dependence on supplemental oxygen: Secondary | ICD-10-CM | POA: Diagnosis not present

## 2021-04-15 DIAGNOSIS — J9601 Acute respiratory failure with hypoxia: Secondary | ICD-10-CM | POA: Diagnosis not present

## 2021-04-15 DIAGNOSIS — I739 Peripheral vascular disease, unspecified: Secondary | ICD-10-CM | POA: Diagnosis not present

## 2021-04-15 DIAGNOSIS — N184 Chronic kidney disease, stage 4 (severe): Secondary | ICD-10-CM | POA: Diagnosis not present

## 2021-04-15 DIAGNOSIS — E43 Unspecified severe protein-calorie malnutrition: Secondary | ICD-10-CM | POA: Diagnosis not present

## 2021-04-15 DIAGNOSIS — I129 Hypertensive chronic kidney disease with stage 1 through stage 4 chronic kidney disease, or unspecified chronic kidney disease: Secondary | ICD-10-CM | POA: Diagnosis not present

## 2021-04-15 DIAGNOSIS — Z431 Encounter for attention to gastrostomy: Secondary | ICD-10-CM | POA: Diagnosis not present

## 2021-04-15 DIAGNOSIS — E039 Hypothyroidism, unspecified: Secondary | ICD-10-CM | POA: Diagnosis not present

## 2021-04-15 DIAGNOSIS — I700 Atherosclerosis of aorta: Secondary | ICD-10-CM | POA: Diagnosis not present

## 2021-04-20 DIAGNOSIS — J449 Chronic obstructive pulmonary disease, unspecified: Secondary | ICD-10-CM | POA: Diagnosis not present

## 2021-04-22 DIAGNOSIS — E039 Hypothyroidism, unspecified: Secondary | ICD-10-CM | POA: Diagnosis not present

## 2021-04-22 DIAGNOSIS — J449 Chronic obstructive pulmonary disease, unspecified: Secondary | ICD-10-CM | POA: Diagnosis not present

## 2021-04-22 DIAGNOSIS — Z9981 Dependence on supplemental oxygen: Secondary | ICD-10-CM | POA: Diagnosis not present

## 2021-04-22 DIAGNOSIS — I700 Atherosclerosis of aorta: Secondary | ICD-10-CM | POA: Diagnosis not present

## 2021-04-22 DIAGNOSIS — N184 Chronic kidney disease, stage 4 (severe): Secondary | ICD-10-CM | POA: Diagnosis not present

## 2021-04-22 DIAGNOSIS — Z431 Encounter for attention to gastrostomy: Secondary | ICD-10-CM | POA: Diagnosis not present

## 2021-04-22 DIAGNOSIS — R1314 Dysphagia, pharyngoesophageal phase: Secondary | ICD-10-CM | POA: Diagnosis not present

## 2021-04-22 DIAGNOSIS — E871 Hypo-osmolality and hyponatremia: Secondary | ICD-10-CM | POA: Diagnosis not present

## 2021-04-22 DIAGNOSIS — E782 Mixed hyperlipidemia: Secondary | ICD-10-CM | POA: Diagnosis not present

## 2021-04-22 DIAGNOSIS — I739 Peripheral vascular disease, unspecified: Secondary | ICD-10-CM | POA: Diagnosis not present

## 2021-04-22 DIAGNOSIS — E43 Unspecified severe protein-calorie malnutrition: Secondary | ICD-10-CM | POA: Diagnosis not present

## 2021-04-22 DIAGNOSIS — J9601 Acute respiratory failure with hypoxia: Secondary | ICD-10-CM | POA: Diagnosis not present

## 2021-04-23 DIAGNOSIS — E43 Unspecified severe protein-calorie malnutrition: Secondary | ICD-10-CM | POA: Diagnosis not present

## 2021-04-23 DIAGNOSIS — E039 Hypothyroidism, unspecified: Secondary | ICD-10-CM | POA: Diagnosis not present

## 2021-04-23 DIAGNOSIS — E871 Hypo-osmolality and hyponatremia: Secondary | ICD-10-CM | POA: Diagnosis not present

## 2021-04-23 DIAGNOSIS — N184 Chronic kidney disease, stage 4 (severe): Secondary | ICD-10-CM | POA: Diagnosis not present

## 2021-04-23 DIAGNOSIS — Z9981 Dependence on supplemental oxygen: Secondary | ICD-10-CM | POA: Diagnosis not present

## 2021-04-23 DIAGNOSIS — E782 Mixed hyperlipidemia: Secondary | ICD-10-CM | POA: Diagnosis not present

## 2021-04-23 DIAGNOSIS — J9601 Acute respiratory failure with hypoxia: Secondary | ICD-10-CM | POA: Diagnosis not present

## 2021-04-23 DIAGNOSIS — J449 Chronic obstructive pulmonary disease, unspecified: Secondary | ICD-10-CM | POA: Diagnosis not present

## 2021-04-23 DIAGNOSIS — I700 Atherosclerosis of aorta: Secondary | ICD-10-CM | POA: Diagnosis not present

## 2021-04-23 DIAGNOSIS — I739 Peripheral vascular disease, unspecified: Secondary | ICD-10-CM | POA: Diagnosis not present

## 2021-04-23 DIAGNOSIS — Z431 Encounter for attention to gastrostomy: Secondary | ICD-10-CM | POA: Diagnosis not present

## 2021-04-23 DIAGNOSIS — R1314 Dysphagia, pharyngoesophageal phase: Secondary | ICD-10-CM | POA: Diagnosis not present

## 2021-04-26 DIAGNOSIS — Z431 Encounter for attention to gastrostomy: Secondary | ICD-10-CM | POA: Diagnosis not present

## 2021-04-26 DIAGNOSIS — R1314 Dysphagia, pharyngoesophageal phase: Secondary | ICD-10-CM | POA: Diagnosis not present

## 2021-04-26 DIAGNOSIS — E039 Hypothyroidism, unspecified: Secondary | ICD-10-CM | POA: Diagnosis not present

## 2021-04-26 DIAGNOSIS — E871 Hypo-osmolality and hyponatremia: Secondary | ICD-10-CM | POA: Diagnosis not present

## 2021-04-26 DIAGNOSIS — N184 Chronic kidney disease, stage 4 (severe): Secondary | ICD-10-CM | POA: Diagnosis not present

## 2021-04-26 DIAGNOSIS — E782 Mixed hyperlipidemia: Secondary | ICD-10-CM | POA: Diagnosis not present

## 2021-04-26 DIAGNOSIS — I700 Atherosclerosis of aorta: Secondary | ICD-10-CM | POA: Diagnosis not present

## 2021-04-26 DIAGNOSIS — J9601 Acute respiratory failure with hypoxia: Secondary | ICD-10-CM | POA: Diagnosis not present

## 2021-04-26 DIAGNOSIS — I739 Peripheral vascular disease, unspecified: Secondary | ICD-10-CM | POA: Diagnosis not present

## 2021-04-26 DIAGNOSIS — J449 Chronic obstructive pulmonary disease, unspecified: Secondary | ICD-10-CM | POA: Diagnosis not present

## 2021-04-26 DIAGNOSIS — E43 Unspecified severe protein-calorie malnutrition: Secondary | ICD-10-CM | POA: Diagnosis not present

## 2021-04-26 DIAGNOSIS — Z9981 Dependence on supplemental oxygen: Secondary | ICD-10-CM | POA: Diagnosis not present

## 2021-04-28 DIAGNOSIS — E039 Hypothyroidism, unspecified: Secondary | ICD-10-CM | POA: Diagnosis not present

## 2021-04-28 DIAGNOSIS — Z9981 Dependence on supplemental oxygen: Secondary | ICD-10-CM | POA: Diagnosis not present

## 2021-04-28 DIAGNOSIS — N184 Chronic kidney disease, stage 4 (severe): Secondary | ICD-10-CM | POA: Diagnosis not present

## 2021-04-28 DIAGNOSIS — E782 Mixed hyperlipidemia: Secondary | ICD-10-CM | POA: Diagnosis not present

## 2021-04-28 DIAGNOSIS — I739 Peripheral vascular disease, unspecified: Secondary | ICD-10-CM | POA: Diagnosis not present

## 2021-04-28 DIAGNOSIS — E43 Unspecified severe protein-calorie malnutrition: Secondary | ICD-10-CM | POA: Diagnosis not present

## 2021-04-28 DIAGNOSIS — I700 Atherosclerosis of aorta: Secondary | ICD-10-CM | POA: Diagnosis not present

## 2021-04-28 DIAGNOSIS — E871 Hypo-osmolality and hyponatremia: Secondary | ICD-10-CM | POA: Diagnosis not present

## 2021-04-28 DIAGNOSIS — R1314 Dysphagia, pharyngoesophageal phase: Secondary | ICD-10-CM | POA: Diagnosis not present

## 2021-04-28 DIAGNOSIS — J449 Chronic obstructive pulmonary disease, unspecified: Secondary | ICD-10-CM | POA: Diagnosis not present

## 2021-04-28 DIAGNOSIS — Z431 Encounter for attention to gastrostomy: Secondary | ICD-10-CM | POA: Diagnosis not present

## 2021-04-28 DIAGNOSIS — J9601 Acute respiratory failure with hypoxia: Secondary | ICD-10-CM | POA: Diagnosis not present

## 2021-05-05 DIAGNOSIS — R1314 Dysphagia, pharyngoesophageal phase: Secondary | ICD-10-CM | POA: Diagnosis not present

## 2021-05-05 DIAGNOSIS — J9601 Acute respiratory failure with hypoxia: Secondary | ICD-10-CM | POA: Diagnosis not present

## 2021-05-05 DIAGNOSIS — Z431 Encounter for attention to gastrostomy: Secondary | ICD-10-CM | POA: Diagnosis not present

## 2021-05-05 DIAGNOSIS — E871 Hypo-osmolality and hyponatremia: Secondary | ICD-10-CM | POA: Diagnosis not present

## 2021-05-05 DIAGNOSIS — E782 Mixed hyperlipidemia: Secondary | ICD-10-CM | POA: Diagnosis not present

## 2021-05-05 DIAGNOSIS — I739 Peripheral vascular disease, unspecified: Secondary | ICD-10-CM | POA: Diagnosis not present

## 2021-05-05 DIAGNOSIS — N184 Chronic kidney disease, stage 4 (severe): Secondary | ICD-10-CM | POA: Diagnosis not present

## 2021-05-05 DIAGNOSIS — E43 Unspecified severe protein-calorie malnutrition: Secondary | ICD-10-CM | POA: Diagnosis not present

## 2021-05-05 DIAGNOSIS — J449 Chronic obstructive pulmonary disease, unspecified: Secondary | ICD-10-CM | POA: Diagnosis not present

## 2021-05-05 DIAGNOSIS — Z9981 Dependence on supplemental oxygen: Secondary | ICD-10-CM | POA: Diagnosis not present

## 2021-05-05 DIAGNOSIS — E039 Hypothyroidism, unspecified: Secondary | ICD-10-CM | POA: Diagnosis not present

## 2021-05-05 DIAGNOSIS — I700 Atherosclerosis of aorta: Secondary | ICD-10-CM | POA: Diagnosis not present

## 2021-05-12 DIAGNOSIS — E782 Mixed hyperlipidemia: Secondary | ICD-10-CM | POA: Diagnosis not present

## 2021-05-12 DIAGNOSIS — I700 Atherosclerosis of aorta: Secondary | ICD-10-CM | POA: Diagnosis not present

## 2021-05-12 DIAGNOSIS — E43 Unspecified severe protein-calorie malnutrition: Secondary | ICD-10-CM | POA: Diagnosis not present

## 2021-05-12 DIAGNOSIS — J449 Chronic obstructive pulmonary disease, unspecified: Secondary | ICD-10-CM | POA: Diagnosis not present

## 2021-05-12 DIAGNOSIS — E039 Hypothyroidism, unspecified: Secondary | ICD-10-CM | POA: Diagnosis not present

## 2021-05-12 DIAGNOSIS — J9601 Acute respiratory failure with hypoxia: Secondary | ICD-10-CM | POA: Diagnosis not present

## 2021-05-12 DIAGNOSIS — E871 Hypo-osmolality and hyponatremia: Secondary | ICD-10-CM | POA: Diagnosis not present

## 2021-05-12 DIAGNOSIS — N184 Chronic kidney disease, stage 4 (severe): Secondary | ICD-10-CM | POA: Diagnosis not present

## 2021-05-12 DIAGNOSIS — Z431 Encounter for attention to gastrostomy: Secondary | ICD-10-CM | POA: Diagnosis not present

## 2021-05-12 DIAGNOSIS — Z9981 Dependence on supplemental oxygen: Secondary | ICD-10-CM | POA: Diagnosis not present

## 2021-05-12 DIAGNOSIS — I739 Peripheral vascular disease, unspecified: Secondary | ICD-10-CM | POA: Diagnosis not present

## 2021-05-12 DIAGNOSIS — R1314 Dysphagia, pharyngoesophageal phase: Secondary | ICD-10-CM | POA: Diagnosis not present

## 2021-05-14 DIAGNOSIS — Z931 Gastrostomy status: Secondary | ICD-10-CM | POA: Diagnosis not present

## 2021-05-14 DIAGNOSIS — R2689 Other abnormalities of gait and mobility: Secondary | ICD-10-CM | POA: Diagnosis not present

## 2021-05-14 DIAGNOSIS — M6259 Muscle wasting and atrophy, not elsewhere classified, multiple sites: Secondary | ICD-10-CM | POA: Diagnosis not present

## 2021-05-14 DIAGNOSIS — J449 Chronic obstructive pulmonary disease, unspecified: Secondary | ICD-10-CM | POA: Diagnosis not present

## 2021-05-16 DIAGNOSIS — I129 Hypertensive chronic kidney disease with stage 1 through stage 4 chronic kidney disease, or unspecified chronic kidney disease: Secondary | ICD-10-CM | POA: Diagnosis not present

## 2021-05-16 DIAGNOSIS — E441 Mild protein-calorie malnutrition: Secondary | ICD-10-CM | POA: Diagnosis not present

## 2021-05-16 DIAGNOSIS — J449 Chronic obstructive pulmonary disease, unspecified: Secondary | ICD-10-CM | POA: Diagnosis not present

## 2021-05-18 DIAGNOSIS — E039 Hypothyroidism, unspecified: Secondary | ICD-10-CM | POA: Diagnosis not present

## 2021-05-19 DIAGNOSIS — Z931 Gastrostomy status: Secondary | ICD-10-CM | POA: Diagnosis not present

## 2021-05-19 DIAGNOSIS — J449 Chronic obstructive pulmonary disease, unspecified: Secondary | ICD-10-CM | POA: Diagnosis not present

## 2021-05-19 DIAGNOSIS — R2689 Other abnormalities of gait and mobility: Secondary | ICD-10-CM | POA: Diagnosis not present

## 2021-05-19 DIAGNOSIS — M6259 Muscle wasting and atrophy, not elsewhere classified, multiple sites: Secondary | ICD-10-CM | POA: Diagnosis not present

## 2021-05-19 DIAGNOSIS — Z681 Body mass index (BMI) 19 or less, adult: Secondary | ICD-10-CM | POA: Diagnosis not present

## 2021-05-19 DIAGNOSIS — E039 Hypothyroidism, unspecified: Secondary | ICD-10-CM | POA: Diagnosis not present

## 2021-05-21 DIAGNOSIS — J449 Chronic obstructive pulmonary disease, unspecified: Secondary | ICD-10-CM | POA: Diagnosis not present

## 2021-06-16 DIAGNOSIS — I129 Hypertensive chronic kidney disease with stage 1 through stage 4 chronic kidney disease, or unspecified chronic kidney disease: Secondary | ICD-10-CM | POA: Diagnosis not present

## 2021-06-16 DIAGNOSIS — E441 Mild protein-calorie malnutrition: Secondary | ICD-10-CM | POA: Diagnosis not present

## 2021-06-16 DIAGNOSIS — J449 Chronic obstructive pulmonary disease, unspecified: Secondary | ICD-10-CM | POA: Diagnosis not present

## 2021-06-16 DIAGNOSIS — E782 Mixed hyperlipidemia: Secondary | ICD-10-CM | POA: Diagnosis not present

## 2021-06-18 DIAGNOSIS — M6259 Muscle wasting and atrophy, not elsewhere classified, multiple sites: Secondary | ICD-10-CM | POA: Diagnosis not present

## 2021-06-18 DIAGNOSIS — Z931 Gastrostomy status: Secondary | ICD-10-CM | POA: Diagnosis not present

## 2021-06-18 DIAGNOSIS — R2689 Other abnormalities of gait and mobility: Secondary | ICD-10-CM | POA: Diagnosis not present

## 2021-06-18 DIAGNOSIS — J449 Chronic obstructive pulmonary disease, unspecified: Secondary | ICD-10-CM | POA: Diagnosis not present

## 2021-06-21 DIAGNOSIS — J449 Chronic obstructive pulmonary disease, unspecified: Secondary | ICD-10-CM | POA: Diagnosis not present

## 2021-06-24 DIAGNOSIS — E039 Hypothyroidism, unspecified: Secondary | ICD-10-CM | POA: Diagnosis not present

## 2021-07-02 DIAGNOSIS — E039 Hypothyroidism, unspecified: Secondary | ICD-10-CM | POA: Diagnosis not present

## 2021-07-02 DIAGNOSIS — Z931 Gastrostomy status: Secondary | ICD-10-CM | POA: Diagnosis not present

## 2021-07-02 DIAGNOSIS — Z681 Body mass index (BMI) 19 or less, adult: Secondary | ICD-10-CM | POA: Diagnosis not present

## 2021-07-14 DIAGNOSIS — E782 Mixed hyperlipidemia: Secondary | ICD-10-CM | POA: Diagnosis not present

## 2021-07-14 DIAGNOSIS — I129 Hypertensive chronic kidney disease with stage 1 through stage 4 chronic kidney disease, or unspecified chronic kidney disease: Secondary | ICD-10-CM | POA: Diagnosis not present

## 2021-07-14 DIAGNOSIS — E441 Mild protein-calorie malnutrition: Secondary | ICD-10-CM | POA: Diagnosis not present

## 2021-07-14 DIAGNOSIS — J449 Chronic obstructive pulmonary disease, unspecified: Secondary | ICD-10-CM | POA: Diagnosis not present

## 2021-07-19 DIAGNOSIS — J449 Chronic obstructive pulmonary disease, unspecified: Secondary | ICD-10-CM | POA: Diagnosis not present

## 2021-07-29 DIAGNOSIS — R2689 Other abnormalities of gait and mobility: Secondary | ICD-10-CM | POA: Diagnosis not present

## 2021-07-29 DIAGNOSIS — M6259 Muscle wasting and atrophy, not elsewhere classified, multiple sites: Secondary | ICD-10-CM | POA: Diagnosis not present

## 2021-07-29 DIAGNOSIS — Z931 Gastrostomy status: Secondary | ICD-10-CM | POA: Diagnosis not present

## 2021-07-29 DIAGNOSIS — J449 Chronic obstructive pulmonary disease, unspecified: Secondary | ICD-10-CM | POA: Diagnosis not present

## 2021-08-14 DIAGNOSIS — E782 Mixed hyperlipidemia: Secondary | ICD-10-CM | POA: Diagnosis not present

## 2021-08-14 DIAGNOSIS — I129 Hypertensive chronic kidney disease with stage 1 through stage 4 chronic kidney disease, or unspecified chronic kidney disease: Secondary | ICD-10-CM | POA: Diagnosis not present

## 2021-08-19 DIAGNOSIS — J449 Chronic obstructive pulmonary disease, unspecified: Secondary | ICD-10-CM | POA: Diagnosis not present

## 2021-09-02 DIAGNOSIS — Z136 Encounter for screening for cardiovascular disorders: Secondary | ICD-10-CM | POA: Diagnosis not present

## 2021-09-02 DIAGNOSIS — Z139 Encounter for screening, unspecified: Secondary | ICD-10-CM | POA: Diagnosis not present

## 2021-09-02 DIAGNOSIS — R636 Underweight: Secondary | ICD-10-CM | POA: Diagnosis not present

## 2021-09-02 DIAGNOSIS — Z0000 Encounter for general adult medical examination without abnormal findings: Secondary | ICD-10-CM | POA: Diagnosis not present

## 2021-09-13 DIAGNOSIS — E782 Mixed hyperlipidemia: Secondary | ICD-10-CM | POA: Diagnosis not present

## 2021-09-13 DIAGNOSIS — I129 Hypertensive chronic kidney disease with stage 1 through stage 4 chronic kidney disease, or unspecified chronic kidney disease: Secondary | ICD-10-CM | POA: Diagnosis not present

## 2021-09-18 DIAGNOSIS — J449 Chronic obstructive pulmonary disease, unspecified: Secondary | ICD-10-CM | POA: Diagnosis not present

## 2021-09-28 DIAGNOSIS — Z931 Gastrostomy status: Secondary | ICD-10-CM | POA: Diagnosis not present

## 2021-09-28 DIAGNOSIS — J449 Chronic obstructive pulmonary disease, unspecified: Secondary | ICD-10-CM | POA: Diagnosis not present

## 2021-09-28 DIAGNOSIS — M6259 Muscle wasting and atrophy, not elsewhere classified, multiple sites: Secondary | ICD-10-CM | POA: Diagnosis not present

## 2021-09-28 DIAGNOSIS — R2689 Other abnormalities of gait and mobility: Secondary | ICD-10-CM | POA: Diagnosis not present

## 2021-10-14 DIAGNOSIS — I129 Hypertensive chronic kidney disease with stage 1 through stage 4 chronic kidney disease, or unspecified chronic kidney disease: Secondary | ICD-10-CM | POA: Diagnosis not present

## 2021-10-14 DIAGNOSIS — E782 Mixed hyperlipidemia: Secondary | ICD-10-CM | POA: Diagnosis not present

## 2021-10-19 DIAGNOSIS — J449 Chronic obstructive pulmonary disease, unspecified: Secondary | ICD-10-CM | POA: Diagnosis not present

## 2021-11-13 DIAGNOSIS — E782 Mixed hyperlipidemia: Secondary | ICD-10-CM | POA: Diagnosis not present

## 2021-11-13 DIAGNOSIS — I129 Hypertensive chronic kidney disease with stage 1 through stage 4 chronic kidney disease, or unspecified chronic kidney disease: Secondary | ICD-10-CM | POA: Diagnosis not present

## 2021-11-18 DIAGNOSIS — J449 Chronic obstructive pulmonary disease, unspecified: Secondary | ICD-10-CM | POA: Diagnosis not present

## 2021-12-01 DIAGNOSIS — R2689 Other abnormalities of gait and mobility: Secondary | ICD-10-CM | POA: Diagnosis not present

## 2021-12-01 DIAGNOSIS — M6259 Muscle wasting and atrophy, not elsewhere classified, multiple sites: Secondary | ICD-10-CM | POA: Diagnosis not present

## 2021-12-01 DIAGNOSIS — J449 Chronic obstructive pulmonary disease, unspecified: Secondary | ICD-10-CM | POA: Diagnosis not present

## 2021-12-01 DIAGNOSIS — Z931 Gastrostomy status: Secondary | ICD-10-CM | POA: Diagnosis not present

## 2021-12-14 DIAGNOSIS — E782 Mixed hyperlipidemia: Secondary | ICD-10-CM | POA: Diagnosis not present

## 2021-12-14 DIAGNOSIS — I129 Hypertensive chronic kidney disease with stage 1 through stage 4 chronic kidney disease, or unspecified chronic kidney disease: Secondary | ICD-10-CM | POA: Diagnosis not present

## 2021-12-19 DIAGNOSIS — J449 Chronic obstructive pulmonary disease, unspecified: Secondary | ICD-10-CM | POA: Diagnosis not present

## 2021-12-28 DIAGNOSIS — I10 Essential (primary) hypertension: Secondary | ICD-10-CM | POA: Diagnosis not present

## 2021-12-28 DIAGNOSIS — E039 Hypothyroidism, unspecified: Secondary | ICD-10-CM | POA: Diagnosis not present

## 2021-12-28 DIAGNOSIS — E782 Mixed hyperlipidemia: Secondary | ICD-10-CM | POA: Diagnosis not present

## 2022-01-05 DIAGNOSIS — E782 Mixed hyperlipidemia: Secondary | ICD-10-CM | POA: Diagnosis not present

## 2022-01-05 DIAGNOSIS — I10 Essential (primary) hypertension: Secondary | ICD-10-CM | POA: Diagnosis not present

## 2022-01-05 DIAGNOSIS — J449 Chronic obstructive pulmonary disease, unspecified: Secondary | ICD-10-CM | POA: Diagnosis not present

## 2022-01-05 DIAGNOSIS — E039 Hypothyroidism, unspecified: Secondary | ICD-10-CM | POA: Diagnosis not present

## 2022-01-14 DIAGNOSIS — E782 Mixed hyperlipidemia: Secondary | ICD-10-CM | POA: Diagnosis not present

## 2022-01-14 DIAGNOSIS — I129 Hypertensive chronic kidney disease with stage 1 through stage 4 chronic kidney disease, or unspecified chronic kidney disease: Secondary | ICD-10-CM | POA: Diagnosis not present

## 2022-01-19 DIAGNOSIS — J449 Chronic obstructive pulmonary disease, unspecified: Secondary | ICD-10-CM | POA: Diagnosis not present

## 2022-01-19 DIAGNOSIS — R2689 Other abnormalities of gait and mobility: Secondary | ICD-10-CM | POA: Diagnosis not present

## 2022-01-19 DIAGNOSIS — M6259 Muscle wasting and atrophy, not elsewhere classified, multiple sites: Secondary | ICD-10-CM | POA: Diagnosis not present

## 2022-01-19 DIAGNOSIS — Z931 Gastrostomy status: Secondary | ICD-10-CM | POA: Diagnosis not present

## 2022-02-13 DIAGNOSIS — I129 Hypertensive chronic kidney disease with stage 1 through stage 4 chronic kidney disease, or unspecified chronic kidney disease: Secondary | ICD-10-CM | POA: Diagnosis not present

## 2022-02-13 DIAGNOSIS — E782 Mixed hyperlipidemia: Secondary | ICD-10-CM | POA: Diagnosis not present

## 2022-02-18 DIAGNOSIS — J449 Chronic obstructive pulmonary disease, unspecified: Secondary | ICD-10-CM | POA: Diagnosis not present

## 2022-03-16 DIAGNOSIS — E782 Mixed hyperlipidemia: Secondary | ICD-10-CM | POA: Diagnosis not present

## 2022-03-16 DIAGNOSIS — I129 Hypertensive chronic kidney disease with stage 1 through stage 4 chronic kidney disease, or unspecified chronic kidney disease: Secondary | ICD-10-CM | POA: Diagnosis not present

## 2022-03-18 DIAGNOSIS — Z931 Gastrostomy status: Secondary | ICD-10-CM | POA: Diagnosis not present

## 2022-03-18 DIAGNOSIS — J449 Chronic obstructive pulmonary disease, unspecified: Secondary | ICD-10-CM | POA: Diagnosis not present

## 2022-03-18 DIAGNOSIS — R2689 Other abnormalities of gait and mobility: Secondary | ICD-10-CM | POA: Diagnosis not present

## 2022-03-18 DIAGNOSIS — M6259 Muscle wasting and atrophy, not elsewhere classified, multiple sites: Secondary | ICD-10-CM | POA: Diagnosis not present

## 2022-03-21 DIAGNOSIS — J449 Chronic obstructive pulmonary disease, unspecified: Secondary | ICD-10-CM | POA: Diagnosis not present

## 2022-04-04 DIAGNOSIS — I10 Essential (primary) hypertension: Secondary | ICD-10-CM | POA: Diagnosis not present

## 2022-04-04 DIAGNOSIS — Z139 Encounter for screening, unspecified: Secondary | ICD-10-CM | POA: Diagnosis not present

## 2022-04-04 DIAGNOSIS — Z931 Gastrostomy status: Secondary | ICD-10-CM | POA: Diagnosis not present

## 2022-04-04 DIAGNOSIS — J449 Chronic obstructive pulmonary disease, unspecified: Secondary | ICD-10-CM | POA: Diagnosis not present

## 2022-04-04 DIAGNOSIS — E782 Mixed hyperlipidemia: Secondary | ICD-10-CM | POA: Diagnosis not present

## 2022-04-04 DIAGNOSIS — R0609 Other forms of dyspnea: Secondary | ICD-10-CM | POA: Diagnosis not present

## 2022-04-04 DIAGNOSIS — Z6820 Body mass index (BMI) 20.0-20.9, adult: Secondary | ICD-10-CM | POA: Diagnosis not present

## 2022-04-06 DIAGNOSIS — R0609 Other forms of dyspnea: Secondary | ICD-10-CM | POA: Diagnosis not present

## 2022-04-15 DIAGNOSIS — I129 Hypertensive chronic kidney disease with stage 1 through stage 4 chronic kidney disease, or unspecified chronic kidney disease: Secondary | ICD-10-CM | POA: Diagnosis not present

## 2022-04-15 DIAGNOSIS — E782 Mixed hyperlipidemia: Secondary | ICD-10-CM | POA: Diagnosis not present

## 2022-04-20 DIAGNOSIS — J449 Chronic obstructive pulmonary disease, unspecified: Secondary | ICD-10-CM | POA: Diagnosis not present

## 2022-04-25 DIAGNOSIS — Z139 Encounter for screening, unspecified: Secondary | ICD-10-CM | POA: Diagnosis not present

## 2022-04-25 DIAGNOSIS — J449 Chronic obstructive pulmonary disease, unspecified: Secondary | ICD-10-CM | POA: Diagnosis not present

## 2022-04-25 DIAGNOSIS — R2689 Other abnormalities of gait and mobility: Secondary | ICD-10-CM | POA: Diagnosis not present

## 2022-04-25 DIAGNOSIS — Z931 Gastrostomy status: Secondary | ICD-10-CM | POA: Diagnosis not present

## 2022-04-25 DIAGNOSIS — Z6820 Body mass index (BMI) 20.0-20.9, adult: Secondary | ICD-10-CM | POA: Diagnosis not present

## 2022-04-25 DIAGNOSIS — M6259 Muscle wasting and atrophy, not elsewhere classified, multiple sites: Secondary | ICD-10-CM | POA: Diagnosis not present

## 2022-04-25 DIAGNOSIS — R0609 Other forms of dyspnea: Secondary | ICD-10-CM | POA: Diagnosis not present

## 2022-05-16 DIAGNOSIS — E782 Mixed hyperlipidemia: Secondary | ICD-10-CM | POA: Diagnosis not present

## 2022-05-16 DIAGNOSIS — I129 Hypertensive chronic kidney disease with stage 1 through stage 4 chronic kidney disease, or unspecified chronic kidney disease: Secondary | ICD-10-CM | POA: Diagnosis not present

## 2022-05-21 DIAGNOSIS — J449 Chronic obstructive pulmonary disease, unspecified: Secondary | ICD-10-CM | POA: Diagnosis not present

## 2022-05-25 DIAGNOSIS — Z931 Gastrostomy status: Secondary | ICD-10-CM | POA: Diagnosis not present

## 2022-05-25 DIAGNOSIS — M6259 Muscle wasting and atrophy, not elsewhere classified, multiple sites: Secondary | ICD-10-CM | POA: Diagnosis not present

## 2022-05-25 DIAGNOSIS — R2689 Other abnormalities of gait and mobility: Secondary | ICD-10-CM | POA: Diagnosis not present

## 2022-05-25 DIAGNOSIS — J449 Chronic obstructive pulmonary disease, unspecified: Secondary | ICD-10-CM | POA: Diagnosis not present

## 2022-06-16 DIAGNOSIS — E782 Mixed hyperlipidemia: Secondary | ICD-10-CM | POA: Diagnosis not present

## 2022-06-16 DIAGNOSIS — I129 Hypertensive chronic kidney disease with stage 1 through stage 4 chronic kidney disease, or unspecified chronic kidney disease: Secondary | ICD-10-CM | POA: Diagnosis not present

## 2022-06-20 DIAGNOSIS — Z931 Gastrostomy status: Secondary | ICD-10-CM | POA: Diagnosis not present

## 2022-06-20 DIAGNOSIS — R2689 Other abnormalities of gait and mobility: Secondary | ICD-10-CM | POA: Diagnosis not present

## 2022-06-20 DIAGNOSIS — J449 Chronic obstructive pulmonary disease, unspecified: Secondary | ICD-10-CM | POA: Diagnosis not present

## 2022-06-20 DIAGNOSIS — M6259 Muscle wasting and atrophy, not elsewhere classified, multiple sites: Secondary | ICD-10-CM | POA: Diagnosis not present

## 2022-06-21 DIAGNOSIS — J449 Chronic obstructive pulmonary disease, unspecified: Secondary | ICD-10-CM | POA: Diagnosis not present

## 2022-06-27 IMAGING — XA IR PERC PLACEMENT GASTROSTOMY
1 series · 5 of 5 positions shown · non-contrast
Comparison: none

INDICATION: 80-year-old female with a history of dysphagia

[Series 1: fl neuro · 2 acquisitions, 5 frames shown]
[im 1/2]
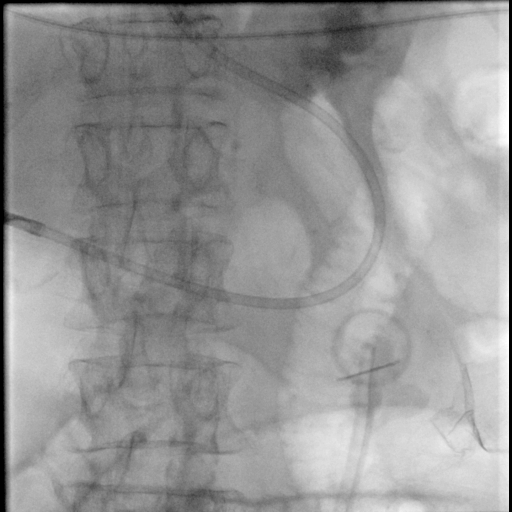
[im 1/2]
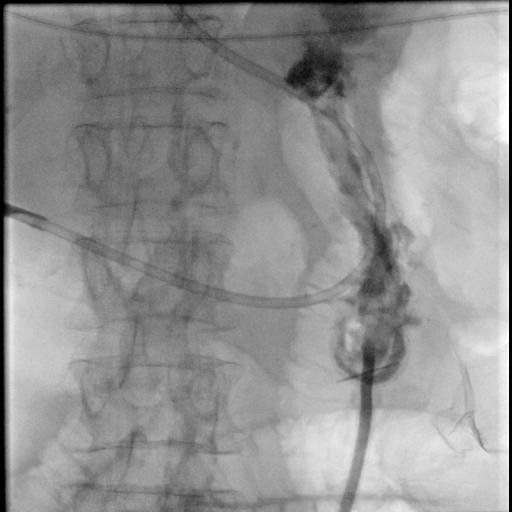
[im 1/2]
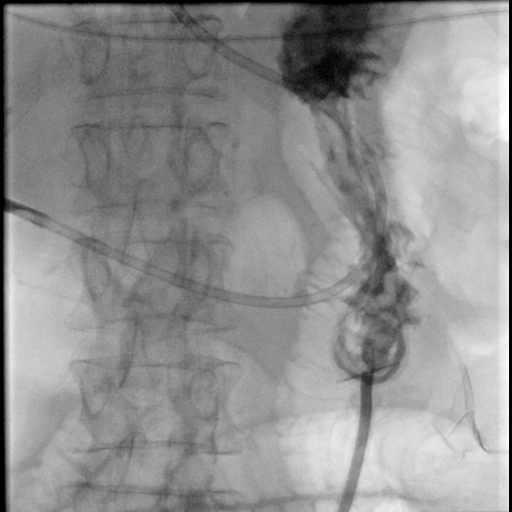
[im 1/2]
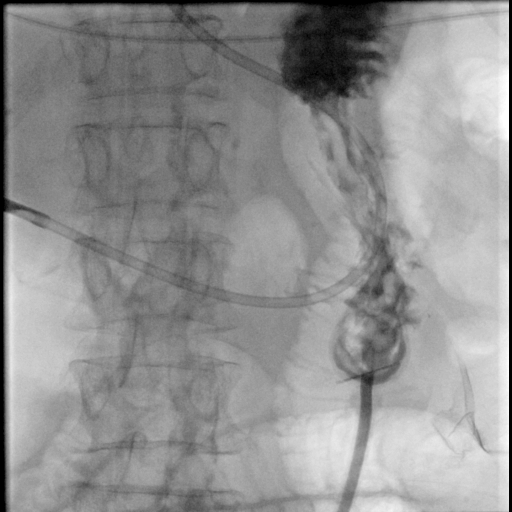
[im 2/2]
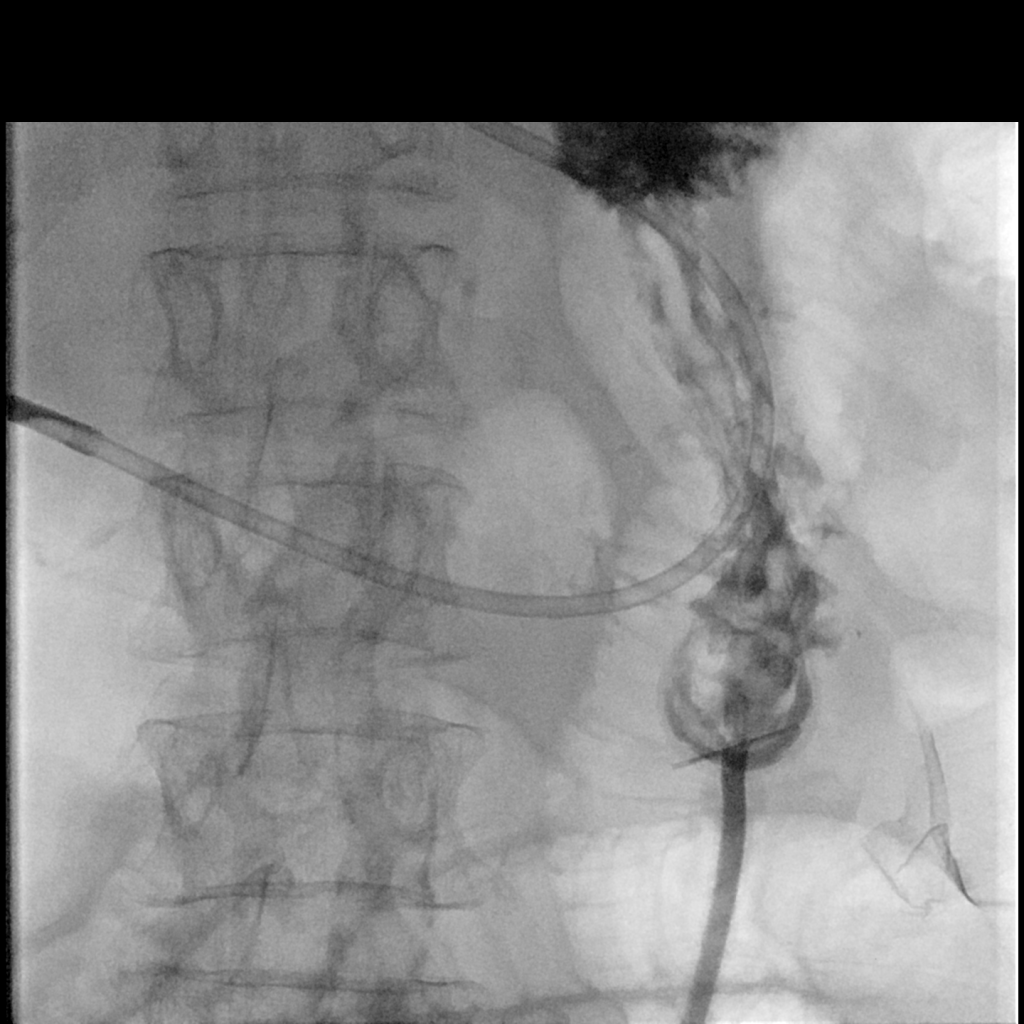

[5 of 5 positions shown; findings below may reference images not displayed]

EXAM:
PERC PLACEMENT GASTROSTOMY

MEDICATIONS:
2 g Ancef; Antibiotics were administered within 1 hour of the
procedure.

ANESTHESIA/SEDATION:
Versed 2.0 mg IV; Fentanyl 150 mcg IV

Moderate Sedation Time:  12 minutes

The patient was continuously monitored during the procedure by the
interventional radiology nurse under my direct supervision.

CONTRAST:  10 cc-administered into the gastric lumen.

FLUOROSCOPY TIME:  Fluoroscopy Time: 7 minutes 6 seconds (25.7 mGy).

COMPLICATIONS:
None

PROCEDURE:
Informed written consent was obtained from the patient and the
patient's family after a thorough discussion of the procedural
risks, benefits and alternatives. All questions were addressed.
Maximal Sterile Barrier Technique was utilized including caps, mask,
sterile gowns, sterile gloves, sterile drape, hand hygiene and skin
antiseptic. A timeout was performed prior to the initiation of the
procedure.

The epigastrium was prepped with Betadine in a sterile fashion, and
a sterile drape was applied covering the operative field. A sterile
gown and sterile gloves were used for the procedure.

A 5-French orogastric tube is placed under fluoroscopic guidance.
Scout imaging of the abdomen confirms barium within the transverse
colon.

The stomach was distended with gas. Under fluoroscopic guidance, an
18 gauge needle was utilized to puncture the anterior wall of the
body of the stomach. An Amplatz wire was advanced through the needle
passing a T fastener into the lumen of the stomach. The T fastener
was secured for gastropexy. A 9-French sheath was inserted.

A snare was advanced through the 9-French sheath. Azusena Ramey was
advanced through the orogastric tube. It was snared then pulled out
the oral cavity, pulling the snare, as well. The leading edge of the
gastrostomy was attached to the snare. It was then pulled down the
esophagus and out the percutaneous site. Tube secured in place.
Contrast was injected.

Patient tolerated the procedure well and remained hemodynamically
stable throughout.

No complications were encountered and no significant blood loss
encountered.
IMPRESSION: Status post fluoroscopic placed percutaneous gastrostomy tube, with
20 French pull-through.

## 2022-07-04 DIAGNOSIS — E782 Mixed hyperlipidemia: Secondary | ICD-10-CM | POA: Diagnosis not present

## 2022-07-04 DIAGNOSIS — E039 Hypothyroidism, unspecified: Secondary | ICD-10-CM | POA: Diagnosis not present

## 2022-07-04 DIAGNOSIS — I10 Essential (primary) hypertension: Secondary | ICD-10-CM | POA: Diagnosis not present

## 2022-07-13 ENCOUNTER — Emergency Department (HOSPITAL_COMMUNITY): Payer: Medicare Other

## 2022-07-13 ENCOUNTER — Inpatient Hospital Stay (HOSPITAL_COMMUNITY)
Admission: EM | Admit: 2022-07-13 | Discharge: 2022-07-20 | DRG: 640 | Disposition: A | Discharge status: Home / Self Care | Payer: Medicare Other | Attending: Family Medicine | Admitting: Family Medicine

## 2022-07-13 DIAGNOSIS — E870 Hyperosmolality and hypernatremia: Principal | ICD-10-CM | POA: Diagnosis present

## 2022-07-13 DIAGNOSIS — Z79899 Other long term (current) drug therapy: Secondary | ICD-10-CM

## 2022-07-13 DIAGNOSIS — R1310 Dysphagia, unspecified: Secondary | ICD-10-CM | POA: Diagnosis not present

## 2022-07-13 DIAGNOSIS — R059 Cough, unspecified: Secondary | ICD-10-CM | POA: Diagnosis not present

## 2022-07-13 DIAGNOSIS — E441 Mild protein-calorie malnutrition: Secondary | ICD-10-CM | POA: Diagnosis not present

## 2022-07-13 DIAGNOSIS — R1312 Dysphagia, oropharyngeal phase: Secondary | ICD-10-CM | POA: Diagnosis not present

## 2022-07-13 DIAGNOSIS — R64 Cachexia: Secondary | ICD-10-CM | POA: Diagnosis present

## 2022-07-13 DIAGNOSIS — I10 Essential (primary) hypertension: Secondary | ICD-10-CM | POA: Diagnosis present

## 2022-07-13 DIAGNOSIS — E039 Hypothyroidism, unspecified: Secondary | ICD-10-CM | POA: Diagnosis present

## 2022-07-13 DIAGNOSIS — E782 Mixed hyperlipidemia: Secondary | ICD-10-CM | POA: Diagnosis not present

## 2022-07-13 DIAGNOSIS — F32A Depression, unspecified: Secondary | ICD-10-CM | POA: Diagnosis not present

## 2022-07-13 DIAGNOSIS — I779 Disorder of arteries and arterioles, unspecified: Secondary | ICD-10-CM | POA: Diagnosis not present

## 2022-07-13 DIAGNOSIS — I739 Peripheral vascular disease, unspecified: Secondary | ICD-10-CM | POA: Diagnosis not present

## 2022-07-13 DIAGNOSIS — E785 Hyperlipidemia, unspecified: Secondary | ICD-10-CM | POA: Diagnosis not present

## 2022-07-13 DIAGNOSIS — Z66 Do not resuscitate: Secondary | ICD-10-CM | POA: Diagnosis present

## 2022-07-13 DIAGNOSIS — J449 Chronic obstructive pulmonary disease, unspecified: Secondary | ICD-10-CM | POA: Diagnosis not present

## 2022-07-13 DIAGNOSIS — Z7401 Bed confinement status: Secondary | ICD-10-CM | POA: Diagnosis not present

## 2022-07-13 DIAGNOSIS — I129 Hypertensive chronic kidney disease with stage 1 through stage 4 chronic kidney disease, or unspecified chronic kidney disease: Secondary | ICD-10-CM | POA: Diagnosis present

## 2022-07-13 DIAGNOSIS — N184 Chronic kidney disease, stage 4 (severe): Secondary | ICD-10-CM | POA: Diagnosis not present

## 2022-07-13 DIAGNOSIS — Z6820 Body mass index (BMI) 20.0-20.9, adult: Secondary | ICD-10-CM | POA: Diagnosis not present

## 2022-07-13 DIAGNOSIS — Z7409 Other reduced mobility: Secondary | ICD-10-CM | POA: Diagnosis not present

## 2022-07-13 DIAGNOSIS — Z79890 Hormone replacement therapy (postmenopausal): Secondary | ICD-10-CM

## 2022-07-13 DIAGNOSIS — R0602 Shortness of breath: Secondary | ICD-10-CM | POA: Diagnosis not present

## 2022-07-13 DIAGNOSIS — R627 Adult failure to thrive: Secondary | ICD-10-CM | POA: Diagnosis present

## 2022-07-13 DIAGNOSIS — E860 Dehydration: Secondary | ICD-10-CM | POA: Diagnosis not present

## 2022-07-13 DIAGNOSIS — R1013 Epigastric pain: Secondary | ICD-10-CM | POA: Diagnosis not present

## 2022-07-13 DIAGNOSIS — G8929 Other chronic pain: Secondary | ICD-10-CM | POA: Diagnosis not present

## 2022-07-13 DIAGNOSIS — Z743 Need for continuous supervision: Secondary | ICD-10-CM | POA: Diagnosis not present

## 2022-07-13 DIAGNOSIS — N19 Unspecified kidney failure: Secondary | ICD-10-CM

## 2022-07-13 DIAGNOSIS — I2510 Atherosclerotic heart disease of native coronary artery without angina pectoris: Secondary | ICD-10-CM | POA: Diagnosis present

## 2022-07-13 DIAGNOSIS — N179 Acute kidney failure, unspecified: Secondary | ICD-10-CM | POA: Diagnosis not present

## 2022-07-13 DIAGNOSIS — Z87891 Personal history of nicotine dependence: Secondary | ICD-10-CM | POA: Diagnosis not present

## 2022-07-13 DIAGNOSIS — Z931 Gastrostomy status: Secondary | ICD-10-CM | POA: Diagnosis not present

## 2022-07-13 DIAGNOSIS — R531 Weakness: Secondary | ICD-10-CM | POA: Diagnosis not present

## 2022-07-13 DIAGNOSIS — R0689 Other abnormalities of breathing: Secondary | ICD-10-CM | POA: Diagnosis not present

## 2022-07-13 DIAGNOSIS — R5381 Other malaise: Secondary | ICD-10-CM | POA: Diagnosis present

## 2022-07-13 DIAGNOSIS — I700 Atherosclerosis of aorta: Secondary | ICD-10-CM | POA: Diagnosis not present

## 2022-07-13 DIAGNOSIS — E43 Unspecified severe protein-calorie malnutrition: Secondary | ICD-10-CM | POA: Diagnosis present

## 2022-07-13 DIAGNOSIS — Z139 Encounter for screening, unspecified: Secondary | ICD-10-CM | POA: Diagnosis not present

## 2022-07-13 DIAGNOSIS — M6259 Muscle wasting and atrophy, not elsewhere classified, multiple sites: Secondary | ICD-10-CM | POA: Diagnosis not present

## 2022-07-13 DIAGNOSIS — I499 Cardiac arrhythmia, unspecified: Secondary | ICD-10-CM | POA: Diagnosis not present

## 2022-07-13 DIAGNOSIS — R262 Difficulty in walking, not elsewhere classified: Secondary | ICD-10-CM | POA: Diagnosis not present

## 2022-07-13 LAB — BASIC METABOLIC PANEL
Anion gap: 14 (ref 5–15)
BUN: 104 mg/dL — ABNORMAL HIGH (ref 8–23)
CO2: 22 mmol/L (ref 22–32)
Calcium: 9.3 mg/dL (ref 8.9–10.3)
Chloride: 121 mmol/L — ABNORMAL HIGH (ref 98–111)
Creatinine, Ser: 3.07 mg/dL — ABNORMAL HIGH (ref 0.44–1.00)
GFR, Estimated: 15 mL/min — ABNORMAL LOW (ref >60)
Glucose, Bld: 120 mg/dL — ABNORMAL HIGH (ref 70–99)
Potassium: 4.2 mmol/L (ref 3.5–5.1)
Sodium: 157 mmol/L — ABNORMAL HIGH (ref 135–145)

## 2022-07-13 LAB — CBC WITH DIFFERENTIAL/PLATELET
Abs Immature Granulocytes: 0.02 10*3/uL (ref 0.00–0.07)
Basophils Absolute: 0 10*3/uL (ref 0.0–0.1)
Basophils Relative: 0 %
Eosinophils Absolute: 0.1 10*3/uL (ref 0.0–0.5)
Eosinophils Relative: 1 %
HCT: 42.1 % (ref 36.0–46.0)
Hemoglobin: 13.1 g/dL (ref 12.0–15.0)
Immature Granulocytes: 0 %
Lymphocytes Relative: 24 %
Lymphs Abs: 2.6 10*3/uL (ref 0.7–4.0)
MCH: 28.5 pg (ref 26.0–34.0)
MCHC: 31.1 g/dL (ref 30.0–36.0)
MCV: 91.5 fL (ref 80.0–100.0)
Monocytes Absolute: 1.1 10*3/uL — ABNORMAL HIGH (ref 0.1–1.0)
Monocytes Relative: 10 %
Neutro Abs: 7.1 10*3/uL (ref 1.7–7.7)
Neutrophils Relative %: 65 %
Platelets: 233 10*3/uL (ref 150–400)
RBC: 4.6 MIL/uL (ref 3.87–5.11)
RDW: 13.9 % (ref 11.5–15.5)
WBC: 11 10*3/uL — ABNORMAL HIGH (ref 4.0–10.5)
nRBC: 0 % (ref 0.0–0.2)

## 2022-07-13 MED ORDER — DIATRIZOATE MEGLUMINE & SODIUM 66-10 % PO SOLN
ORAL | Status: AC
  Administered 2022-07-14: 30 mL
  Filled 2022-07-13: qty 30

## 2022-07-13 NOTE — ED Notes (Signed)
Daughter [name] [name] [phone]. Would like an update

## 2022-07-13 NOTE — ED Triage Notes (Signed)
Pt to ED via EMS from home. Pt c/o sudden onset of generalized weakness. Pt has PEG tube and had appointment here today because daughter thought it was dislodged but was [facility] stating PEG tube was normal. No other neuro deficits noted other than pt being weak. Pt normally ambulatory with assistance at home but is unable to ambulate today. Pt is AAOx4.   EMS Vitals: 20 RAC 129 CBG 138/92 95% RA 105 HR 100cc NS bolus

## 2022-07-13 NOTE — ED Provider Notes (Incomplete)
[facility] Provider Note   CSN: [id number] Arrival date & time: 07/13/22  2006     History {Add pertinent medical, surgical, social history, OB history to HPI:1} Chief Complaint  Patient presents with  . Weakness    [name] [name] is a 82 y.o. female.  HPI     Home Medications Prior to Admission medications   Medication Sig Start Date End Date Taking? Authorizing Provider  albuterol (VENTOLIN HFA) 108 (90 Base) MCG/ACT inhaler Inhale 2 puffs into the lungs every 6 (six) hours as needed. 11/09/20   [provider]  levothyroxine (SYNTHROID) 50 MCG tablet Place 1 tablet (50 mcg total) into feeding tube daily. 01/31/21   Sharion Settler, DO  metoprolol succinate (TOPROL-XL) 100 MG 24 hr tablet Take 1 tablet (100 mg total) by mouth daily. Take with or immediately following a meal. 01/31/21   Espinoza, Dawson Bills, DO  polyethylene glycol (MIRALAX / GLYCOLAX) 17 g packet Place 17 g into feeding tube every other day. 01/31/21   Sharion Settler, DO  rosuvastatin (CRESTOR) 40 MG tablet Take 40 mg by mouth daily.      [provider]  tiotropium (SPIRIVA) 18 MCG inhalation capsule Place 18 mcg into inhaler and inhale daily.      [provider]      Allergies    Patient has no known allergies.    Review of Systems   Review of Systems  Constitutional:  Negative for fever.  Respiratory:  Negative for cough.   Cardiovascular:  Negative for chest pain.  Gastrointestinal:  Negative for diarrhea and vomiting.  Neurological:  Negative for headaches.    Physical Exam Updated Vital Signs BP 139/72   Pulse 92   Temp (!) 97.5 F (36.4 C) (Oral)   Resp 20   SpO2 98%  Physical Exam  ED Results / Procedures / Treatments   Labs (all labs ordered are listed, but only abnormal results are displayed) Labs Reviewed  CBC WITH DIFFERENTIAL/PLATELET - Abnormal; Notable for the following components:      Result  Value   WBC 11.0 (*)    Monocytes Absolute 1.1 (*)    All other components within normal limits  BASIC METABOLIC PANEL - Abnormal; Notable for the following components:   Sodium 157 (*)    Chloride 121 (*)    Glucose, Bld 120 (*)    BUN 104 (*)    Creatinine, Ser 3.07 (*)    GFR, Estimated 15 (*)    All other components within normal limits  URINALYSIS, ROUTINE W REFLEX MICROSCOPIC    EKG None  Radiology No results found.  Procedures Procedures  {Document cardiac monitor, telemetry assessment procedure when appropriate:1}  Medications Ordered in ED Medications - No data to display  ED Course/ Medical Decision Making/ A&P   {   Click here for ABCD2, HEART and other calculatorsREFRESH Note before signing :1}                          Medical Decision Making Amount and/or Complexity of Data Reviewed Labs: ordered. Radiology: ordered.  Risk Decision regarding hospitalization.   ***  {Document critical care time when appropriate:1} {Document review of labs and clinical decision tools ie heart score, Chads2Vasc2 etc:1}  {Document your independent review of radiology images, and any outside records:1} {Document your discussion with family members, caretakers, and with consultants:1} {Document social determinants of health affecting pt's care:1} {Document your  decision making why or why not admission, treatments were needed:1} Final Clinical Impression(s) / ED Diagnoses Final diagnoses:  Dehydration  Renal failure, unspecified chronicity    Rx / DC Orders ED Discharge Orders     None

## 2022-07-13 NOTE — ED Provider Notes (Signed)
[facility] Provider Note   CSN: [id number] Arrival date & time: 07/13/22  2006     History  Chief Complaint  Patient presents with   Weakness    [name] [name] is a 82 y.o. female.  HPI   82 year old female presents emergency department accompanied by daughter for concern of weakness.  Patient has extensive medical history including severe dysphagia and malnutrition requiring a PEG tube.  Patient currently lives by herself, is independent and ambulatory.  Daughter who is her primary caregiver states over the past couple days she has had a significant decrease in her intake.  She has been declining any intake through her PEG tube stating that she feels full and does not want it.  Otherwise daughter has not noted any signs of acute illness including fever, vomiting, diarrhea.  Patient herself is a poor historian but has no specific complaints.  Daughter states that she took the patient to be evaluated earlier today, they checked the PEG tube and felt that it was appropriate and she was sent home.  Home Medications Prior to Admission medications   Medication Sig Start Date End Date Taking? Authorizing Provider  albuterol (VENTOLIN HFA) 108 (90 Base) MCG/ACT inhaler Inhale 2 puffs into the lungs every 6 (six) hours as needed. 11/09/20   [provider]  levothyroxine (SYNTHROID) 50 MCG tablet Place 1 tablet (50 mcg total) into feeding tube daily. 01/31/21   Sharion Settler, DO  metoprolol succinate (TOPROL-XL) 100 MG 24 hr tablet Take 1 tablet (100 mg total) by mouth daily. Take with or immediately following a meal. 01/31/21   Espinoza, Dawson Bills, DO  polyethylene glycol (MIRALAX / GLYCOLAX) 17 g packet Place 17 g into feeding tube every other day. 01/31/21   Sharion Settler, DO  rosuvastatin (CRESTOR) 40 MG tablet Take 40 mg by mouth daily.      [provider]  tiotropium (SPIRIVA) 18 MCG inhalation capsule Place 18 mcg  into inhaler and inhale daily.      [provider]      Allergies    Patient has no known allergies.    Review of Systems   Review of Systems  Constitutional:  Negative for fever.  Respiratory:  Negative for cough.   Cardiovascular:  Negative for chest pain.  Gastrointestinal:  Negative for diarrhea and vomiting.  Neurological:  Negative for headaches.    Physical Exam Updated Vital Signs BP 139/72   Pulse 92   Temp (!) 97.5 F (36.4 C) (Oral)   Resp 20   SpO2 98%  Physical Exam Vitals and nursing note reviewed.  Constitutional:      General: She is not in acute distress.    Appearance: Normal appearance. She is ill-appearing.     Comments: cachetic  HENT:     Head: Normocephalic.     Mouth/Throat:     Mouth: Mucous membranes are moist.  Eyes:     Pupils: Pupils are equal, round, and reactive to light.  Cardiovascular:     Rate and Rhythm: Normal rate.  Pulmonary:     Effort: Pulmonary effort is normal. No respiratory distress.  Abdominal:     General: There is no distension.     Palpations: Abdomen is soft.     Tenderness: There is no abdominal tenderness.     Comments: PEG tube in place  Musculoskeletal:        General: No deformity.  Skin:    General: Skin is warm.  Neurological:     Mental Status: She is alert and oriented to person, place, and time. Mental status is at baseline.  Psychiatric:        Mood and Affect: Mood normal.     ED Results / Procedures / Treatments   Labs (all labs ordered are listed, but only abnormal results are displayed) Labs Reviewed  CBC WITH DIFFERENTIAL/PLATELET - Abnormal; Notable for the following components:      Result Value   WBC 11.0 (*)    Monocytes Absolute 1.1 (*)    All other components within normal limits  BASIC METABOLIC PANEL - Abnormal; Notable for the following components:   Sodium 157 (*)    Chloride 121 (*)    Glucose, Bld 120 (*)    BUN 104 (*)    Creatinine, Ser 3.07 (*)    GFR,  Estimated 15 (*)    All other components within normal limits  URINALYSIS, ROUTINE W REFLEX MICROSCOPIC    EKG None  Radiology No results found.  Procedures Procedures    Medications Ordered in ED Medications - No data to display  ED Course/ Medical Decision Making/ A&P                             Medical Decision Making Amount and/or Complexity of Data Reviewed Labs: ordered. Radiology: ordered.  Risk Decision regarding hospitalization.   82 year old female presents emergency department with worsening weakness.  Brought in originally by the daughter who states that she has been having decreased intake through her PEG tube.  Was tachycardic on arrival but this resolved on my initial evaluation.  Patient is a poor historian, no specific complaints.  Lab evaluation reveals renal failure and hyponatremia, consistent with decreased p.o. intake.  Will plan for x-ray to confirm PEG tube placement.  If appropriate we will plan for free water.  Otherwise will admit for further care.  Daughter updated by myself.  Patients evaluation and results requires admission for further treatment and care.  Spoke with hospitalist, reviewed patient's ED course and they accept admission.  Patient agrees with admission plan, offers no new complaints and is stable/unchanged at time of admit.        Final Clinical Impression(s) / ED Diagnoses Final diagnoses:  Dehydration  Renal failure, unspecified chronicity    Rx / DC Orders ED Discharge Orders     None         Lorelle Gibbs, DO 07/14/22 [id number]

## 2022-07-14 DIAGNOSIS — E860 Dehydration: Secondary | ICD-10-CM | POA: Diagnosis present

## 2022-07-14 DIAGNOSIS — Z931 Gastrostomy status: Secondary | ICD-10-CM | POA: Diagnosis not present

## 2022-07-14 DIAGNOSIS — E785 Hyperlipidemia, unspecified: Secondary | ICD-10-CM | POA: Diagnosis present

## 2022-07-14 DIAGNOSIS — Z79890 Hormone replacement therapy (postmenopausal): Secondary | ICD-10-CM | POA: Diagnosis not present

## 2022-07-14 DIAGNOSIS — N184 Chronic kidney disease, stage 4 (severe): Secondary | ICD-10-CM | POA: Diagnosis not present

## 2022-07-14 DIAGNOSIS — I10 Essential (primary) hypertension: Secondary | ICD-10-CM | POA: Diagnosis not present

## 2022-07-14 DIAGNOSIS — E039 Hypothyroidism, unspecified: Secondary | ICD-10-CM | POA: Diagnosis present

## 2022-07-14 DIAGNOSIS — E43 Unspecified severe protein-calorie malnutrition: Secondary | ICD-10-CM | POA: Diagnosis present

## 2022-07-14 DIAGNOSIS — E782 Mixed hyperlipidemia: Secondary | ICD-10-CM | POA: Diagnosis not present

## 2022-07-14 DIAGNOSIS — Z79899 Other long term (current) drug therapy: Secondary | ICD-10-CM | POA: Diagnosis not present

## 2022-07-14 DIAGNOSIS — R627 Adult failure to thrive: Secondary | ICD-10-CM | POA: Diagnosis present

## 2022-07-14 DIAGNOSIS — F32A Depression, unspecified: Secondary | ICD-10-CM | POA: Diagnosis present

## 2022-07-14 DIAGNOSIS — J449 Chronic obstructive pulmonary disease, unspecified: Secondary | ICD-10-CM | POA: Diagnosis present

## 2022-07-14 DIAGNOSIS — Z87891 Personal history of nicotine dependence: Secondary | ICD-10-CM | POA: Diagnosis not present

## 2022-07-14 DIAGNOSIS — R1310 Dysphagia, unspecified: Secondary | ICD-10-CM | POA: Diagnosis present

## 2022-07-14 DIAGNOSIS — N179 Acute kidney failure, unspecified: Secondary | ICD-10-CM | POA: Diagnosis present

## 2022-07-14 DIAGNOSIS — R5381 Other malaise: Secondary | ICD-10-CM | POA: Diagnosis present

## 2022-07-14 DIAGNOSIS — I2510 Atherosclerotic heart disease of native coronary artery without angina pectoris: Secondary | ICD-10-CM | POA: Diagnosis present

## 2022-07-14 DIAGNOSIS — I739 Peripheral vascular disease, unspecified: Secondary | ICD-10-CM | POA: Diagnosis present

## 2022-07-14 DIAGNOSIS — Z66 Do not resuscitate: Secondary | ICD-10-CM | POA: Diagnosis present

## 2022-07-14 DIAGNOSIS — E870 Hyperosmolality and hypernatremia: Secondary | ICD-10-CM | POA: Diagnosis present

## 2022-07-14 DIAGNOSIS — R64 Cachexia: Secondary | ICD-10-CM | POA: Diagnosis present

## 2022-07-14 DIAGNOSIS — Z6820 Body mass index (BMI) 20.0-20.9, adult: Secondary | ICD-10-CM | POA: Diagnosis not present

## 2022-07-14 DIAGNOSIS — I129 Hypertensive chronic kidney disease with stage 1 through stage 4 chronic kidney disease, or unspecified chronic kidney disease: Secondary | ICD-10-CM | POA: Diagnosis not present

## 2022-07-14 LAB — URINALYSIS, ROUTINE W REFLEX MICROSCOPIC
Bilirubin Urine: NEGATIVE
Glucose, UA: NEGATIVE mg/dL
Ketones, ur: NEGATIVE mg/dL
Nitrite: NEGATIVE
Protein, ur: 30 mg/dL — AB
Specific Gravity, Urine: 1.013 (ref 1.005–1.030)
pH: 5 (ref 5.0–8.0)

## 2022-07-14 LAB — COMPREHENSIVE METABOLIC PANEL
ALT: 47 U/L — ABNORMAL HIGH (ref 0–44)
AST: 55 U/L — ABNORMAL HIGH (ref 15–41)
Albumin: 3.2 g/dL — ABNORMAL LOW (ref 3.5–5.0)
Alkaline Phosphatase: 53 U/L (ref 38–126)
Anion gap: 13 (ref 5–15)
BUN: 97 mg/dL — ABNORMAL HIGH (ref 8–23)
CO2: 23 mmol/L (ref 22–32)
Calcium: 9.3 mg/dL (ref 8.9–10.3)
Chloride: 122 mmol/L — ABNORMAL HIGH (ref 98–111)
Creatinine, Ser: 2.88 mg/dL — ABNORMAL HIGH (ref 0.44–1.00)
GFR, Estimated: 16 mL/min — ABNORMAL LOW (ref >60)
Glucose, Bld: 108 mg/dL — ABNORMAL HIGH (ref 70–99)
Potassium: 3.7 mmol/L (ref 3.5–5.1)
Sodium: 158 mmol/L — ABNORMAL HIGH (ref 135–145)
Total Bilirubin: 0.7 mg/dL (ref 0.3–1.2)
Total Protein: 7.5 g/dL (ref 6.5–8.1)

## 2022-07-14 LAB — CBC WITH DIFFERENTIAL/PLATELET
Abs Immature Granulocytes: 0.02 10*3/uL (ref 0.00–0.07)
Basophils Absolute: 0 10*3/uL (ref 0.0–0.1)
Basophils Relative: 0 %
Eosinophils Absolute: 0.1 10*3/uL (ref 0.0–0.5)
Eosinophils Relative: 1 %
HCT: 40.9 % (ref 36.0–46.0)
Hemoglobin: 12.5 g/dL (ref 12.0–15.0)
Immature Granulocytes: 0 %
Lymphocytes Relative: 21 %
Lymphs Abs: 2.3 10*3/uL (ref 0.7–4.0)
MCH: 27.7 pg (ref 26.0–34.0)
MCHC: 30.6 g/dL (ref 30.0–36.0)
MCV: 90.5 fL (ref 80.0–100.0)
Monocytes Absolute: 1 10*3/uL (ref 0.1–1.0)
Monocytes Relative: 9 %
Neutro Abs: 7.6 10*3/uL (ref 1.7–7.7)
Neutrophils Relative %: 69 %
Platelets: 249 10*3/uL (ref 150–400)
RBC: 4.52 MIL/uL (ref 3.87–5.11)
RDW: 13.9 % (ref 11.5–15.5)
WBC: 11 10*3/uL — ABNORMAL HIGH (ref 4.0–10.5)
nRBC: 0 % (ref 0.0–0.2)

## 2022-07-14 LAB — SODIUM: Sodium: 157 mmol/L — ABNORMAL HIGH (ref 135–145)

## 2022-07-14 LAB — MAGNESIUM
Magnesium: 3.2 mg/dL — ABNORMAL HIGH (ref 1.7–2.4)
Magnesium: 3.3 mg/dL — ABNORMAL HIGH (ref 1.7–2.4)
Magnesium: 3.2 mg/dL — ABNORMAL HIGH (ref 1.7–2.4)

## 2022-07-14 LAB — GLUCOSE, CAPILLARY
Glucose-Capillary: 155 mg/dL — ABNORMAL HIGH (ref 70–99)
Glucose-Capillary: 185 mg/dL — ABNORMAL HIGH (ref 70–99)

## 2022-07-14 LAB — PREALBUMIN: Prealbumin: 21 mg/dL (ref 18–38)

## 2022-07-14 LAB — TSH: TSH: 0.1 u[IU]/mL — ABNORMAL LOW (ref 0.350–4.500)

## 2022-07-14 LAB — PHOSPHORUS
Phosphorus: 4.9 mg/dL — ABNORMAL HIGH (ref 2.5–4.6)
Phosphorus: 4.8 mg/dL — ABNORMAL HIGH (ref 2.5–4.6)

## 2022-07-14 LAB — T4, FREE: Free T4: 1.6 ng/dL — ABNORMAL HIGH (ref 0.61–1.12)

## 2022-07-14 MED ORDER — DEXTROSE 5 % IV SOLN: INTRAVENOUS | Status: DC

## 2022-07-14 MED ORDER — ALBUTEROL SULFATE (2.5 MG/3ML) 0.083% IN NEBU: 2.5000 mg | INHALATION_SOLUTION | RESPIRATORY_TRACT | Status: DC | PRN

## 2022-07-14 MED ORDER — GUAIFENESIN-DM 100-10 MG/5ML PO SYRP
5.0000 mL | ORAL_SOLUTION | ORAL | Status: DC | PRN
  Administered 2022-07-14 – 2022-07-20 (×13): 5 mL
  Filled 2022-07-14 (×13): qty 10

## 2022-07-14 MED ORDER — FREE WATER
100.0000 mL | Status: DC
  Administered 2022-07-14: 100 mL

## 2022-07-14 MED ORDER — HEPARIN SODIUM (PORCINE) 5000 UNIT/ML IJ SOLN
5000.0000 [IU] | Freq: Three times a day (TID) | INTRAMUSCULAR | Status: DC
  Administered 2022-07-14 – 2022-07-20 (×20): 5000 [IU] via SUBCUTANEOUS
  Filled 2022-07-14 (×20): qty 1

## 2022-07-14 MED ORDER — THIAMINE MONONITRATE 100 MG PO TABS
100.0000 mg | ORAL_TABLET | Freq: Every day | ORAL | Status: DC
  Administered 2022-07-14 – 2022-07-20 (×7): 100 mg
  Filled 2022-07-14 (×7): qty 1

## 2022-07-14 MED ORDER — DIATRIZOATE MEGLUMINE & SODIUM 66-10 % PO SOLN
30.0000 mL | Freq: Once | ORAL | Status: AC
  Filled 2022-07-14: qty 30

## 2022-07-14 MED ORDER — ROSUVASTATIN CALCIUM 20 MG PO TABS
40.0000 mg | ORAL_TABLET | Freq: Every day | ORAL | Status: DC
  Administered 2022-07-14 – 2022-07-20 (×7): 40 mg
  Filled 2022-07-14 (×7): qty 2

## 2022-07-14 MED ORDER — LEVOTHYROXINE SODIUM 25 MCG PO TABS
75.0000 ug | ORAL_TABLET | Freq: Every day | ORAL | Status: DC
  Administered 2022-07-14 – 2022-07-20 (×7): 75 ug
  Filled 2022-07-14 (×7): qty 3

## 2022-07-14 MED ORDER — ONDANSETRON HCL 4 MG/2ML IJ SOLN: 4.0000 mg | Freq: Four times a day (QID) | INTRAMUSCULAR | Status: DC | PRN

## 2022-07-14 MED ORDER — JEVITY 1.5 CAL/FIBER PO LIQD
1000.0000 mL | ORAL | Status: DC
  Administered 2022-07-14 – 2022-07-18 (×4): 1000 mL
  Filled 2022-07-14 (×9): qty 1000

## 2022-07-14 MED ORDER — ACETAMINOPHEN 160 MG/5ML PO SOLN: 650.0000 mg | ORAL | Status: DC | PRN

## 2022-07-14 NOTE — Assessment & Plan Note (Signed)
stable °

## 2022-07-14 NOTE — Assessment & Plan Note (Signed)
Will need updated MAR.

## 2022-07-14 NOTE — Evaluation (Signed)
Physical Therapy Evaluation Patient Details Name: [name] [name] MRN: [id number] DOB: [birth year] Today's Date: 07/14/2022  History of Present Illness  82yo F who presented to the ED via EMS due to weakness and refusing tube feeds via PEG. Admitted with hypernatremia, ARF. PMH COPD, depression, frailty syndrome, HLD, HTN, PEG placement, PVD, throat surgery  Clinical Impression   Patient received in bed, pleasant and cooperative with PT today. Required heavy Mod-MaxAx1 to get to EOB and sit for several minutes, very limited by fatigue this morning. Returned to supine and positioned to comfort with bed in chair position. From available information, she lives alone at baseline- at this point this does not seem very safe, would really benefit from skilled rehab services in SNF setting at this point.        Recommendations for follow up therapy are one component of a multi-disciplinary discharge planning process, led by the attending physician.  Recommendations may be updated based on patient status, additional functional criteria and insurance authorization.  Follow Up Recommendations Skilled nursing-short term rehab (<3 hours/day)      Assistance Recommended at Discharge Frequent or constant Supervision/Assistance  Patient can return home with the following  A lot of help with bathing/dressing/bathroom;Direct supervision/assist for medications management;Direct supervision/assist for financial management;A lot of help with walking and/or transfers;Assist for transportation;Assistance with cooking/housework;Help with stairs or ramp for entrance;Two people to help with walking and/or transfers    Bent Hospital bed;BSC/3in1;Wheelchair (measurements PT);Wheelchair cushion (measurements PT)  Recommendations for Other Services       Functional Status Assessment Patient has had a recent decline in their functional status and demonstrates the ability to make significant  improvements in function in a reasonable and predictable amount of time.     Precautions / Restrictions Precautions Precautions: Fall Restrictions Weight Bearing Restrictions: No      Mobility  Bed Mobility Overal bed mobility: Needs Assistance Bed Mobility: Rolling, Supine to Sit, Sit to Supine Rolling: Mod assist   Supine to sit: Max assist, HOB elevated Sit to supine: Max assist, HOB elevated   General bed mobility comments: ModA to roll side to side, MaxAx1 with HOB elevated for trunk management and to pivot hips around to EOB; poor balance at EOB and only able to tolerate sitting for 3-4 minutes without posterior and R lean    Transfers                   General transfer comment: unable    Ambulation/Gait               General Gait Details: unable  Stairs            Wheelchair Mobility    Modified Rankin (Stroke Patients Only)       Balance Overall balance assessment: Needs assistance Sitting-balance support: Feet supported, Bilateral upper extremity supported Sitting balance-Leahy Scale: Poor                                       Pertinent Vitals/Pain Pain Assessment Pain Assessment: Faces Faces Pain Scale: No hurt    Home Living Family/patient expects to be discharged to:: Private residence Living Arrangements: Alone Available Help at Discharge: Family;Available PRN/intermittently Type of Home: House Home Access: Level entry       Home Layout: One level Home Equipment: None      Prior Function Prior Level of Function : Independent/Modified Independent  Hand Dominance        Extremity/Trunk Assessment   Upper Extremity Assessment Upper Extremity Assessment: Defer to OT evaluation    Lower Extremity Assessment Lower Extremity Assessment: Generalized weakness    Cervical / Trunk Assessment Cervical / Trunk Assessment: Kyphotic  Communication   Communication: Other  (comment) (very soft spoken)  Cognition Arousal/Alertness: Awake/alert Behavior During Therapy: WFL for tasks assessed/performed Overall Cognitive Status: Within Functional Limits for tasks assessed                                          General Comments      Exercises     Assessment/Plan    PT Assessment Patient needs continued PT services  PT Problem List Decreased strength;Decreased activity tolerance;Decreased balance;Decreased mobility       PT Treatment Interventions DME instruction;Neuromuscular re-education;Gait training;Stair training;Wheelchair mobility training;Functional mobility training;Therapeutic activities;Manual techniques;Therapeutic exercise;Balance training    PT Goals (Current goals can be found in the Care Plan section)  Acute Rehab PT Goals Patient Stated Goal: get stronger PT Goal Formulation: With patient Time For Goal Achievement: 07/28/22 Potential to Achieve Goals: Fair    Frequency Min 2X/week     Co-evaluation               AM-PAC PT "6 Clicks" Mobility  Outcome Measure Help needed turning from your back to your side while in a flat bed without using bedrails?: A Lot Help needed moving from lying on your back to sitting on the side of a flat bed without using bedrails?: A Lot Help needed moving to and from a bed to a chair (including a wheelchair)?: Total Help needed standing up from a chair using your arms (e.g., wheelchair or bedside chair)?: Total Help needed to walk in hospital room?: Total Help needed climbing 3-5 steps with a railing? : Total 6 Click Score: 8    End of Session   Activity Tolerance: Patient tolerated treatment well Patient left: in bed;with call bell/phone within reach;with bed alarm set Nurse Communication: Mobility status PT Visit Diagnosis: Muscle weakness (generalized) (M62.81);Unsteadiness on feet (R26.81);Difficulty in walking, not elsewhere classified (R26.2)    Time:  [id number] PT Time Calculation (min) (ACUTE ONLY): 17 min   Charges:   PT Evaluation $PT Eval Moderate Complexity: 1 Mod         Deniece Ree PT DPT PN2

## 2022-07-14 NOTE — Assessment & Plan Note (Signed)
Baseline Scr 1.92 from 01-2021.  However, given her BUN of 104, have to assume this is acute renal failure on CKD 4.  Repeat BMP in AM.

## 2022-07-14 NOTE — H&P (Signed)
History and Physical    [name] [name] [id number] DOB: 26-May-1940 DOA: 07/13/2022  DOS: the patient was seen and examined on 07/13/2022  PCP: Maryella Shivers, MD   Patient coming from: Home  I have personally briefly reviewed patient's old medical records in [place]  CC: weakness HPI: 82 year old African-American female history of PEG tube, dysphagia, presented to the ER via EMS due to weakness.  No family available at bedside.  Reportedly patient lives at home by herself.  Patient has a daughter who is her primary caretaker.  Reportedly patient has been refusing feeds by her feeding tube.  No other history or review of systems is obtainable from the patient due to poor historical recall.  On arrival temp 97.5, heart rate 102, blood pressure 90/76 satting 97% on room air.  Labs showed a sodium 157, potassium 4.2, chloride of 121, BUN of 104, creatinine 3.07  White count of 11, hemoglobin 13.1, platelets of 233  PEG tube study demonstrated not obstructive bowel gas pattern.  PEG tube was in proper location.  Due to the patient's acute on chronic renal failure, hypernatremia/dehydration, [facility] contacted for admission.   ED Course: BUN 104, Na 157  Review of Systems:  Review of Systems  Unable to perform ROS: Other  Pt with poor historical recall  Past Medical History:  Diagnosis Date   COPD (chronic obstructive pulmonary disease) (HCC)    Depression    Exertional dyspnea    Frailty syndrome in geriatric patient 12/24/2020   HLD (hyperlipidemia)    HTN (hypertension)    Hypothyroidism    PEG (percutaneous endoscopic gastrostomy) status ([place]) 12/24/2020   PVD (peripheral vascular disease) ([place])    occluded right subclavian artery. asymptomatic.     Past Surgical History:  Procedure Laterality Date   hysterectomy - unknown type     IR GASTROSTOMY TUBE MOD SED  12/22/2020   THROAT SURGERY       reports that she quit smoking about 28 years ago. She  has never used smokeless tobacco. She reports that she does not drink alcohol and does not use drugs.  No Known Allergies  No family history on file.  Prior to Admission medications   Medication Sig Start Date End Date Taking? Authorizing Provider  albuterol (VENTOLIN HFA) 108 (90 Base) MCG/ACT inhaler Inhale 2 puffs into the lungs every 6 (six) hours as needed. 11/09/20   [provider]  levothyroxine (SYNTHROID) 50 MCG tablet Place 1 tablet (50 mcg total) into feeding tube daily. 01/31/21   Sharion Settler, DO  metoprolol succinate (TOPROL-XL) 100 MG 24 hr tablet Take 1 tablet (100 mg total) by mouth daily. Take with or immediately following a meal. 01/31/21   Espinoza, Dawson Bills, DO  polyethylene glycol (MIRALAX / GLYCOLAX) 17 g packet Place 17 g into feeding tube every other day. 01/31/21   Sharion Settler, DO  rosuvastatin (CRESTOR) 40 MG tablet Take 40 mg by mouth daily.      [provider]  tiotropium (SPIRIVA) 18 MCG inhalation capsule Place 18 mcg into inhaler and inhale daily.      [provider]    Physical Exam: Vitals:   07/13/22 2315 07/13/22 2345 07/14/22 0015 07/14/22 0025  BP: 139/72 132/73 137/66   Pulse: 92 88 94   Resp: '20 18 14   '$ Temp:    97.8 F (36.6 C)  TempSrc:    Oral  SpO2: 98% 100% 100%     Physical Exam Vitals and nursing note  reviewed.  Constitutional:      Comments: Thin cachectic African-American female.  Appears chronically ill.  HENT:     Head: Normocephalic.     Nose: Nose normal.  Cardiovascular:     Rate and Rhythm: Normal rate and regular rhythm.  Pulmonary:     Effort: Pulmonary effort is normal.  Abdominal:     General: Abdomen is flat. There is no distension.     Comments: PEG tube in the left upper quadrant.  Musculoskeletal:     Right lower leg: No edema.     Left lower leg: No edema.  Skin:    General: Skin is warm and dry.     Capillary Refill: Capillary refill takes less than 2 seconds.      Comments: +tenting of skin on abd  Neurological:     General: No focal deficit present.     Mental Status: She is oriented to person, place, and time.      Labs on Admission: I have personally reviewed following labs and imaging studies  CBC: Recent Labs  Lab 07/13/22 2015  WBC 11.0*  NEUTROABS 7.1  HGB 13.1  HCT 42.1  MCV 91.5  PLT 0000000   Basic Metabolic Panel: Recent Labs  Lab 07/13/22 2015  NA 157*  K 4.2  CL 121*  CO2 22  GLUCOSE 120*  BUN 104*  CREATININE 3.07*  CALCIUM 9.3   GFR: CrCl cannot be calculated (Unknown ideal weight.). Liver Function Tests: No results for input(s): "AST", "ALT", "ALKPHOS", "BILITOT", "PROT", "ALBUMIN" in the last 168 hours. No results for input(s): "LIPASE", "AMYLASE" in the last 168 hours. No results for input(s): "AMMONIA" in the last 168 hours. Coagulation Profile: No results for input(s): "INR", "PROTIME" in the last 168 hours. Cardiac Enzymes: No results for input(s): "CKTOTAL", "CKMB", "CKMBINDEX", "TROPONINI", "TROPONINIHS" in the last 168 hours. BNP (last 3 results) No results for input(s): "PROBNP" in the last 8760 hours. HbA1C: No results for input(s): "HGBA1C" in the last 72 hours. CBG: No results for input(s): "GLUCAP" in the last 168 hours. Lipid Profile: No results for input(s): "CHOL", "HDL", "LDLCALC", "TRIG", "CHOLHDL", "LDLDIRECT" in the last 72 hours. Thyroid Function Tests: No results for input(s): "TSH", "T4TOTAL", "FREET4", "T3FREE", "THYROIDAB" in the last 72 hours. Anemia Panel: No results for input(s): "VITAMINB12", "FOLATE", "FERRITIN", "TIBC", "IRON", "RETICCTPCT" in the last 72 hours. Urine analysis:    Component Value Date/Time   COLORURINE YELLOW 07/13/2022 2350   APPEARANCEUR HAZY (A) 07/13/2022 2350   LABSPEC 1.013 07/13/2022 2350   PHURINE 5.0 07/13/2022 2350   GLUCOSEU NEGATIVE 07/13/2022 2350   HGBUR SMALL (A) 07/13/2022 2350   BILIRUBINUR NEGATIVE 07/13/2022 2350   KETONESUR NEGATIVE  07/13/2022 2350   PROTEINUR 30 (A) 07/13/2022 2350   NITRITE NEGATIVE 07/13/2022 2350   LEUKOCYTESUR SMALL (A) 07/13/2022 2350    Radiological Exams on Admission: I have personally reviewed images DG ABDOMEN PEG TUBE LOCATION  Result Date: 07/14/2022 CLINICAL DATA:  Epigastric pain EXAM: ABDOMEN - 1 VIEW COMPARISON:  12/18/2020 FINDINGS: Nonobstructive bowel-gas pattern. No radiopaque calculi. Contrast within the stomach. IMPRESSION: Negative. Electronically Signed   By: Placido Sou M.D.   On: 07/14/2022 00:22    EKG: My personal interpretation of EKG shows: NSR    Assessment/Plan Principal Problem:   Hypernatremia Active Problems:   Acute renal failure superimposed on stage 4 chronic kidney disease (HCC)   HTN (hypertension)   Protein-calorie malnutrition, severe   PEG (percutaneous endoscopic gastrostomy) status ([place])  Chronic obstructive pulmonary disease (HCC)   Acquired hypothyroidism    Assessment and Plan: * Hypernatremia Observation med/surg bed. Pt states she takes no meds/foods by mouth. Given her level of hypernatremia and hyperchloremia, will try and hydrate her via PEG. PEG tube study shows that PEG is patent. Will start with free H2O via peg @ 100 ml/hr. This will avoid giving her additional sodium and chloride. If she does not tolerate free water via PEG, will change to D5W IVF @ 100 ml/hr. Repeat BMP in AM.  Pt living at home alone may not be the best situation for her. Will need TOC consult.  Acute renal failure superimposed on stage 4 chronic kidney disease ([place]) Baseline Scr 1.92 from 01-2021.  However, given her BUN of 104, have to assume this is acute renal failure on CKD 4.  Repeat BMP in AM.  Acquired hypothyroidism Check TSH.  Chronic obstructive pulmonary disease (HCC) stable  PEG (percutaneous endoscopic gastrostomy) status (HCC) PEG tube study shows that PEG is in proper location and functioning normally.  Protein-calorie malnutrition,  severe Severe. Will need RD consult. Check prealbumin.  HTN (hypertension) Will need updated MAR.   DVT prophylaxis: SQ Heparin Code Status: Full Code by default Family Communication: no family at bedside  Disposition Plan: return home vs ??  Consults called: none  Admission status: Observation, Med-Surg   Kristopher Oppenheim, DO [facility] 07/14/2022, 12:35 AM

## 2022-07-14 NOTE — ED Notes (Signed)
ED TO INPATIENT HANDOFF REPORT  ED Nurse Name and Phone #: Mechele Claude, [phone]  S Name/Age/Gender [name] [name] 82 y.o. female Room/Bed: 024C/024C  Code Status   Code Status: Prior  Home/SNF/Other Home Patient oriented to: self, place, time, and situation Is this baseline? Yes   Triage Complete: Triage complete  Chief Complaint Hypernatremia [E87.0]  Triage Note Pt to ED via EMS from home. Pt c/o sudden onset of generalized weakness. Pt has PEG tube and had appointment here today because daughter thought it was dislodged but was [facility] stating PEG tube was normal. No other neuro deficits noted other than pt being weak. Pt normally ambulatory with assistance at home but is unable to ambulate today. Pt is AAOx4.   EMS Vitals: 20 RAC 129 CBG 138/92 95% RA 105 HR 100cc NS bolus   Allergies No Known Allergies  Level of Care/Admitting Diagnosis ED Disposition     ED Disposition  Admit   Condition  --   Comment  Hospital Area: [place] [[id number]]  Level of Care: Med-Surg [16]  May place patient in observation at [facility] or [place] if equivalent level of care is available:: No  Covid Evaluation: Asymptomatic - no recent exposure (last 10 days) testing not required  Diagnosis: Hypernatremia [id number]  Admitting Physician: Bridgett Larsson, [place]  Attending Physician: Bridgett Larsson, ERIC [[id number]]          B Medical/Surgery History Past Medical History:  Diagnosis Date   COPD (chronic obstructive pulmonary disease) ([place])    Depression    Exertional dyspnea    Frailty syndrome in geriatric patient 12/24/2020   HLD (hyperlipidemia)    HTN (hypertension)    Hypothyroidism    PEG (percutaneous endoscopic gastrostomy) status ([place]) 12/24/2020   PVD (peripheral vascular disease) ([place])    occluded right subclavian artery. asymptomatic.    Past Surgical History:  Procedure Laterality Date   hysterectomy - unknown type     IR GASTROSTOMY TUBE MOD SED   12/22/2020   THROAT SURGERY       A IV Location/Drains/Wounds Patient Lines/Drains/Airways Status     Active Line/Drains/Airways     Name Placement date Placement time Site Days   Peripheral IV 07/13/22 20 G Posterior;Right Forearm 07/13/22  2019  Forearm  1   Peripheral IV 07/13/22 20 G Left;Posterior Forearm 07/13/22  2035  Forearm  1   Gastrostomy/Enterostomy Gastrostomy 20 Fr. LUQ 12/22/20  1653  LUQ  569   External Urinary Catheter 07/13/22  2256  --  1            Intake/Output Last 24 hours No intake or output data in the 24 hours ending 07/14/22 0030  Labs/Imaging Results for orders placed or performed during the hospital encounter of 07/13/22 (from the past 48 hour(s))  CBC with Differential     Status: Abnormal   Collection Time: 07/13/22  8:15 PM  Result Value Ref Range   WBC 11.0 (H) 4.0 - 10.5 K/uL   RBC 4.60 3.87 - 5.11 MIL/uL   Hemoglobin 13.1 12.0 - 15.0 g/dL   HCT 42.1 36.0 - 46.0 %   MCV 91.5 80.0 - 100.0 fL   MCH 28.5 26.0 - 34.0 pg   MCHC 31.1 30.0 - 36.0 g/dL   RDW 13.9 11.5 - 15.5 %   Platelets 233 150 - 400 K/uL    Comment: REPEATED TO VERIFY   nRBC 0.0 0.0 - 0.2 %   Neutrophils Relative % 65 %  Neutro Abs 7.1 1.7 - 7.7 K/uL   Lymphocytes Relative 24 %   Lymphs Abs 2.6 0.7 - 4.0 K/uL   Monocytes Relative 10 %   Monocytes Absolute 1.1 (H) 0.1 - 1.0 K/uL   Eosinophils Relative 1 %   Eosinophils Absolute 0.1 0.0 - 0.5 K/uL   Basophils Relative 0 %   Basophils Absolute 0.0 0.0 - 0.1 K/uL   Immature Granulocytes 0 %   Abs Immature Granulocytes 0.02 0.00 - 0.07 K/uL    Comment: Performed at [facility] [address], [place], [place] [id number]  Basic metabolic panel     Status: Abnormal   Collection Time: 07/13/22  8:15 PM  Result Value Ref Range   Sodium 157 (H) 135 - 145 mmol/L   Potassium 4.2 3.5 - 5.1 mmol/L   Chloride 121 (H) 98 - 111 mmol/L   CO2 22 22 - 32 mmol/L   Glucose, Bld 120 (H) 70 - 99 mg/dL    Comment: Glucose  reference range applies only to samples taken after fasting for at least 8 hours.   BUN 104 (H) 8 - 23 mg/dL   Creatinine, Ser 3.07 (H) 0.44 - 1.00 mg/dL   Calcium 9.3 8.9 - 10.3 mg/dL   GFR, Estimated 15 (L) >60 mL/min    Comment: (NOTE) Calculated using the CKD-EPI Creatinine Equation (2021)    Anion gap 14 5 - 15    Comment: Performed at [facility] [address], [place], [place] [address]   DG ABDOMEN PEG TUBE LOCATION  Result Date: 07/14/2022 CLINICAL DATA:  Epigastric pain EXAM: ABDOMEN - 1 VIEW COMPARISON:  12/18/2020 FINDINGS: Nonobstructive bowel-gas pattern. No radiopaque calculi. Contrast within the stomach. IMPRESSION: Negative. Electronically Signed   By: Placido Sou M.D.   On: 07/14/2022 00:22    Pending Labs Unresulted Labs (From admission, onward)     Start     Ordered   07/13/22 2250  Urinalysis, Routine w reflex microscopic -Urine, Clean Catch  Once,   URGENT       Question:  Specimen Source  Answer:  Urine, Clean Catch   07/13/22 2249            Vitals/Pain Today's Vitals   07/13/22 2315 07/13/22 2345 07/14/22 0015 07/14/22 0025  BP: 139/72 132/73 137/66   Pulse: 92 88 94   Resp: '20 18 14   '$ Temp:    97.8 F (36.6 C)  TempSrc:    Oral  SpO2: 98% 100% 100%   PainSc:        Isolation Precautions No active isolations  Medications Medications  free water 100 mL (has no administration in time range)  diatrizoate meglumine-sodium (GASTROGRAFIN) 66-10 % solution 30 mL (30 mLs Tube Given 07/14/22 0016)    Mobility non-ambulatory     Focused Assessments Neuro Assessment Handoff:  Swallow screen pass? Yes          Neuro Assessment: Exceptions to W[facility] Neuro Checks:      Has TPA been given? No If patient is a Neuro Trauma and patient is going to OR before floor call report to [place] nurse: [phone] or [phone]   R Recommendations: See Admitting Provider Note  Report given to:   Additional Notes: pt is AAOx4. Pt is  on room air. Pt has on purewick.

## 2022-07-14 NOTE — Progress Notes (Signed)
PROGRESS NOTE    [name] [name]  [id number] DOB: 1940/10/04 DOA: 07/13/2022 PCP: Maryella Shivers, MD    Brief Narrative:   [name] [name] is a 82 y.o. female with past medical history significant for hypothyroidism, CKD stage IV, coronary artery disease, PVD, COPD, dysphagia/severe protein calorie malnutrition s/p PEG tube who presented to [facility] on 2/28 via EMS from home with generalized weakness and decreased intake via PEG tube with concern for malfunction.  Daughter reports that patient has been feeling "full" and subsequently has been taking the last tube feeds and water intake via PEG tube.  Patient unable to fully participate given poor historical recall.  In the ED, temperature 97.5 F, HR 102, RR 19, BP 90/76, SpO2 97% room air.  WBC 11.0, hemoglobin 13.1, platelets 233.  Sodium 157, potassium 4.2, chloride 121, CO2 22, glucose 120, BUN 104, creatinine 3.07.  Alysis with small leukocytes, negative nitrite, rare bacteria, 0-5 WBCs.  Abdominal x-ray/PEG tube evaluation with nonobstructive bowel gas pattern, contrast within the stomach.  TRH consulted for further evaluation management of acute on chronic renal failure, hypernatremia secondary to dehydration and generalized weakness.  Assessment & Plan:   Hypernatremia Patient presenting with a sodium 137 in the setting of poor intake via PEG tube over the preceding days of hospitalization.  Likely severe dehydration. -- Na 157>158 -- Nutrition consulted to reinitiate tube feeds today -- D5W at 75 mL/h -- Monitor sodium level every 6 hours, BMP daily  Acute renal failure on CKD stage IV Baseline creatinine 1.9 to from September 2022.  Creatinine on admission elevated to 3.09.  Suspect etiology likely secondary to severe dehydration given decreased tube feeds and water per feeding tube outpatient per daughter. -- Cr 3.07>2.88 -- Nutrition consulted to reinitiate tube feeds today -- D5W at 75 MLS per hour -- Avoid  nephrotoxins, renal dose all medications -- BMP daily  Acquired hypothyroidism TSH 0.100, free T4 1.60 -- Continue levothyroxine 50 mcg per tube daily  COPD -- Albuterol neb every 4 hours as needed shortness of breath/wheezing  Hyperlipidemia CAD/PVD --Atorvastatin 40 mg per tube daily  Dysphagia s/p PEG tube Severe protein calorie malnutrition Adult failure to thrive PEG tube study shows that PEG is in proper location and functioning normally.  Nutrition consulted for reinitiation of tube feeds.  Weakness/debility/deconditioning: --PT/OT consultation   DVT prophylaxis: heparin injection 5,000 Units Start: 07/14/22 0600 SCDs Start: 07/14/22 0437    Code Status: DNR Family Communication: Updated daughter present at bedside this morning  Disposition Plan:  Level of care: Med-Surg Status is: Observation The patient remains OBS appropriate and will d/c before 2 midnights.    Consultants:  None  Procedures:  None  Antimicrobials:  None   Subjective: Patient seen examined bedside, resting comfortably.  Lying in bed.  Daughter present.  Patient continues to "whisper", this is typical behavior per daughter.  Daughter reports that she has had very poor intake due to poor toleration of tube feeds and water via PEG tube.  PEG tube study performed on admission that shows normally functioning PEG tube.  Discussed with daughter that she is severely dehydrated and needs IV fluids and reinitiation of tube feeds today.  Daughter also reports that she has been relatively weak over the last week in which she is normally fairly independent at baseline.  Patient with no other questions or concerns at this time.  Denies headache, no chest pain, no shortness of breath, no abdominal pain, no fever, no nausea/vomiting/diarrhea.  No  acute events overnight per nursing staff.  Objective: Vitals:   07/14/22 0115 07/14/22 0159 07/14/22 0233 07/14/22 0731  BP: 130/74 (!) 87/60 90/67 138/76   Pulse: 95 96 89 100  Resp: 18  18   Temp:    97.7 F (36.5 C)  TempSrc:    Oral  SpO2: 97% 97% 97% 97%   No intake or output data in the 24 hours ending 07/14/22 1102 There were no vitals filed for this visit.  Examination:  Physical Exam: GEN: NAD, alert and oriented x 3, thin/cachectic in appearance HEENT: NCAT, PERRL, EOMI, sclera clear, dry mucous membranes PULM: CTAB w/o wheezes/crackles, normal respiratory effort room air CV: RRR w/o M/G/R GI: abd soft, NTND, NABS, tube noted in place MSK: no peripheral edema, moves all extremity independently NEURO: No focal deficits PSYCH: Depressed mood, flat affect Integumentary: No visible rashes/lesions/wounds noted on exposed skin surfaces    Data Reviewed: I have personally reviewed following labs and imaging studies  CBC: Recent Labs  Lab 07/13/22 2015 07/14/22 0605  WBC 11.0* 11.0*  NEUTROABS 7.1 7.6  HGB 13.1 12.5  HCT 42.1 40.9  MCV 91.5 90.5  PLT 233 0000000   Basic Metabolic Panel: Recent Labs  Lab 07/13/22 2015 07/14/22 0605  NA 157* 158*  K 4.2 3.7  CL 121* 122*  CO2 22 23  GLUCOSE 120* 108*  BUN 104* 97*  CREATININE 3.07* 2.88*  CALCIUM 9.3 9.3  MG  --  3.2*   GFR: CrCl cannot be calculated (Unknown ideal weight.). Liver Function Tests: Recent Labs  Lab 07/14/22 0605  AST 55*  ALT 47*  ALKPHOS 53  BILITOT 0.7  PROT 7.5  ALBUMIN 3.2*   No results for input(s): "LIPASE", "AMYLASE" in the last 168 hours. No results for input(s): "AMMONIA" in the last 168 hours. Coagulation Profile: No results for input(s): "INR", "PROTIME" in the last 168 hours. Cardiac Enzymes: No results for input(s): "CKTOTAL", "CKMB", "CKMBINDEX", "TROPONINI" in the last 168 hours. BNP (last 3 results) No results for input(s): "PROBNP" in the last 8760 hours. HbA1C: No results for input(s): "HGBA1C" in the last 72 hours. CBG: No results for input(s): "GLUCAP" in the last 168 hours. Lipid Profile: No results for  input(s): "CHOL", "HDL", "LDLCALC", "TRIG", "CHOLHDL", "LDLDIRECT" in the last 72 hours. Thyroid Function Tests: Recent Labs    07/13/22 2015 07/14/22 0605  TSH 0.100*  --   FREET4  --  1.60*   Anemia Panel: No results for input(s): "VITAMINB12", "FOLATE", "FERRITIN", "TIBC", "IRON", "RETICCTPCT" in the last 72 hours. Sepsis Labs: No results for input(s): "PROCALCITON", "LATICACIDVEN" in the last 168 hours.  No results found for this or any previous visit (from the past 240 hour(s)).       Radiology Studies: DG ABDOMEN PEG TUBE LOCATION  Result Date: 07/14/2022 CLINICAL DATA:  Epigastric pain EXAM: ABDOMEN - 1 VIEW COMPARISON:  12/18/2020 FINDINGS: Nonobstructive bowel-gas pattern. No radiopaque calculi. Contrast within the stomach. IMPRESSION: Negative. Electronically Signed   By: Placido Sou M.D.   On: 07/14/2022 00:22        Scheduled Meds:  heparin  5,000 Units Subcutaneous Q8H   levothyroxine  50 mcg Per Tube Daily   rosuvastatin  40 mg Per Tube Daily   thiamine  100 mg Per Tube Daily   Continuous Infusions:  dextrose     feeding supplement (JEVITY 1.5 CAL/FIBER)     free water       LOS: 0 days    Time  spent: 52 minutes spent on chart review, discussion with nursing staff, consultants, updating family and interview/physical exam; more than 50% of that time was spent in counseling and/or coordination of care.    Paighton Godette J [place] DO [facility] Available via Epic secure chat 7am-7pm After these hours, please refer to coverage provider listed on [email or web address] 07/14/2022, 11:02 AM

## 2022-07-14 NOTE — Assessment & Plan Note (Signed)
PEG tube study shows that PEG is in proper location and functioning normally.

## 2022-07-14 NOTE — Progress Notes (Signed)
Initial Nutrition Assessment  DOCUMENTATION CODES:   Severe malnutrition in context of chronic illness  INTERVENTION:  - Initiate Jevity 1.5 @ 15 mL/hr and advance by 10 mL Q6H to goal rate of 55 mL/hr (1320 mL/day).  - This will provide 1980 kcals, 84 gm protein, 1003 mL free water.  - FWF of 150 mL Q4H. (Once sodium is corrected).  - MD currently has FWF @ 100 mL/hr due to elevated sodium.   - Add thiamine via tube.   NUTRITION DIAGNOSIS:   Severe Malnutrition related to chronic illness as evidenced by severe fat depletion, severe muscle depletion.  GOAL:   Provide needs based on ASPEN/SCCM guidelines  MONITOR:   TF tolerance  REASON FOR ASSESSMENT:   Consult Enteral/tube feeding initiation and management  ASSESSMENT:   82 y.o. female admits related to weakness. PMH includes: COPD, depression, exertional dyspnea, HLD, HTN, hypothyroidism, PEG. Pt is currently receiving medical management related to hypernatremia.  Meds reviewed. Labs reviewed: Na high, Chloride high, BUN creatinine elevated.   The pt was resting in bed with daughter present at bedside. Daughter reports that the pt was receiving 4 cans of "Nutrient". Unsure, what formula this is exactly but it could be Nutren ([place] formula). Daughter reports that the pt has not been tolerating her TF over the past few days and reports of "feeling full". She also reports that the pt has always had watery bowel movements. RD will trial formula with fiber to see if this helps in setting of watery bowel movements. Pt is severely malnourished. RD will initiate continuous feeds gradually and continue to monitor TF tolerance. MD currently has FWF @ 100 mL/hr related to hypernatremia, RD will continue to monitor Na levels.   NUTRITION - FOCUSED PHYSICAL EXAM:  Flowsheet Row Most Recent Value  Orbital Region Severe depletion  Upper Arm Region Severe depletion  Thoracic and Lumbar Region Severe depletion  Buccal Region Severe  depletion  Temple Region Severe depletion  Clavicle Bone Region Severe depletion  Clavicle and Acromion Bone Region Severe depletion  Scapular Bone Region Severe depletion  Dorsal Hand Severe depletion  Patellar Region Severe depletion  Anterior Thigh Region Severe depletion  Posterior Calf Region Severe depletion  Edema (RD Assessment) None  Hair Reviewed  Eyes Reviewed  Mouth Reviewed  Skin Reviewed  Nails Reviewed       Diet Order:   Diet Order             Diet NPO time specified  Diet effective now                   EDUCATION NEEDS:   Not appropriate for education at this time  Skin:  Skin Assessment: Reviewed RN Assessment  Last BM:  2/27  Height:   Ht Readings from Last 1 Encounters:  01/24/21 '5\' 4"'$  (1.626 m)    Weight:   Wt Readings from Last 1 Encounters:  01/31/21 56.2 kg    Ideal Body Weight:     BMI:  There is no height or weight on file to calculate BMI.  Estimated Nutritional Needs:   Kcal:  [id number] kcals  Protein:  85-100 gm  Fluid:  >/= 1.7 L  Thalia Bloodgood, RD, LDN, CNSC.

## 2022-07-14 NOTE — Subjective & Objective (Addendum)
CC: weakness HPI: 82 year old African-American female history of PEG tube, dysphagia, presented to the ER via EMS due to weakness.  No family available at bedside.  Reportedly patient lives at home by herself.  Patient has a daughter who is her primary caretaker.  Reportedly patient has been refusing feeds by her feeding tube.  No other history or review of systems is obtainable from the patient due to poor historical recall.  On arrival temp 97.5, heart rate 102, blood pressure 90/76 satting 97% on room air.  Labs showed a sodium 157, potassium 4.2, chloride of 121, BUN of 104, creatinine 3.07  White count of 11, hemoglobin 13.1, platelets of 233  PEG tube study demonstrated not obstructive bowel gas pattern.  PEG tube was in proper location.  Due to the patient's acute on chronic renal failure, hypernatremia/dehydration, [facility] contacted for admission.

## 2022-07-14 NOTE — Assessment & Plan Note (Signed)
Check TSH 

## 2022-07-14 NOTE — Assessment & Plan Note (Addendum)
Severe. Will need RD consult. Check prealbumin.

## 2022-07-14 NOTE — Evaluation (Signed)
Occupational Therapy Evaluation Patient Details Name: [name] [name] MRN: [id number] DOB: 12-Jul-1940 Today's Date: 07/14/2022   History of Present Illness 82yo F who presented to the ED via EMS due to weakness and refusing tube feeds via PEG. Admitted with hypernatremia, ARF. PMH COPD, depression, frailty syndrome, HLD, HTN, PEG placement, PVD, throat surgery   Clinical Impression   PTA, pt lives alone though reports daughter present "almost all of the time" - will need to confirm. Pt reports recently requiring assist for ADLs and limited household ambulator.  Pt presents now with deficits in strength, balance and overall endurance. Pt able to sit EOB with Min A but unable to progress to standing trials due to fatigue. Pt requires Min A for UB ADL and up to Total A for LB ADLs. If family unable to provide the assistance pt currently requires, would recommend SNF rehab. Will follow acutely to address strength and ADL/transfer endurance.      Recommendations for follow up therapy are one component of a multi-disciplinary discharge planning process, led by the attending physician.  Recommendations may be updated based on patient status, additional functional criteria and insurance authorization.   Follow Up Recommendations  Skilled nursing-short term rehab (<3 hours/day) (unless family able to provide the assistance currently needed)     Assistance Recommended at Discharge Frequent or constant Supervision/Assistance  Patient can return home with the following A lot of help with walking and/or transfers;A lot of help with bathing/dressing/bathroom    Functional Status Assessment  Patient has had a recent decline in their functional status and demonstrates the ability to make significant improvements in function in a reasonable and predictable amount of time.  Equipment Recommendations  BSC/3in1;Hospital bed;Wheelchair cushion (measurements OT);Wheelchair (measurements OT) (if does not already  have this DME at home)    Recommendations for Other Services       Precautions / Restrictions Precautions Precautions: Fall;Other (comment) Precaution Comments: PEG Restrictions Weight Bearing Restrictions: No      Mobility Bed Mobility Overal bed mobility: Needs Assistance Bed Mobility: Supine to Sit, Sit to Supine     Supine to sit: Min assist, HOB elevated Sit to supine: Min assist   General bed mobility comments: light tactile cues to advance BLE to EOB and Min A to get LE back to bed. pt able to lift trunk without assist    Transfers                          Balance Overall balance assessment: Needs assistance Sitting-balance support: Feet supported, Bilateral upper extremity supported Sitting balance-Leahy Scale: Fair Sitting balance - Comments: able to sit without support from therapist though appears fairly reliant on BUE support                                   ADL either performed or assessed with clinical judgement   ADL Overall ADL's : Needs assistance/impaired Eating/Feeding: NPO   Grooming: Set up;Sitting   Upper Body Bathing: Minimal assistance;Sitting   Lower Body Bathing: Maximal assistance;Sitting/lateral leans;Bed level   Upper Body Dressing : Minimal assistance;Sitting   Lower Body Dressing: Maximal assistance;Sitting/lateral leans;Bed level       Toileting- Clothing Manipulation and Hygiene: Total assistance;Bed level Toileting - Clothing Manipulation Details (indicate cue type and reason): purewick in place       General ADL Comments: Pt with considerable weakness with recent decreased  intake from tube feeds     Vision Ability to See in Adequate Light: 0 Adequate Patient Visual Report: No change from baseline Vision Assessment?: No apparent visual deficits     Perception     Praxis      Pertinent Vitals/Pain Pain Assessment Pain Assessment: No/denies pain     Hand Dominance Right    Extremity/Trunk Assessment Upper Extremity Assessment Upper Extremity Assessment: Generalized weakness   Lower Extremity Assessment Lower Extremity Assessment: Defer to PT evaluation   Cervical / Trunk Assessment Cervical / Trunk Assessment: Kyphotic   Communication Communication Communication: Other (comment) (very soft spoken)   Cognition Arousal/Alertness: Awake/alert Behavior During Therapy: WFL for tasks assessed/performed Overall Cognitive Status: No family/caregiver present to determine baseline cognitive functioning                                 General Comments: reports "i dont remember" to some PLOF questions but very pleasant, follows directions consistently     General Comments       Exercises     Shoulder Instructions      Home Living Family/patient expects to be discharged to:: Private residence Living Arrangements: Alone Available Help at Discharge: Family;Other (Comment) (daughter there "almost all of the time") Type of Home: House Home Access: Level entry     Home Layout: One level     Bathroom Shower/Tub: Teacher, early years/pre: Standard     Home Equipment: Rollator (4 wheels);Shower seat          Prior Functioning/Environment Prior Level of Function : Needs assist;Patient poor historian/Family not available             Mobility Comments: reports using rollator for some limited mobility ADLs Comments: reports recently needing some more assist for bathing, dressing; pt reports unsure if using BSC or going to bathroom        OT Problem List: Decreased strength;Decreased activity tolerance;Impaired balance (sitting and/or standing);Decreased cognition      OT Treatment/Interventions: Self-care/ADL training;Therapeutic exercise;DME and/or AE instruction;Therapeutic activities    OT Goals(Current goals can be found in the care plan section) Acute Rehab OT Goals Patient Stated Goal: gradually increase  strength OT Goal Formulation: With patient Time For Goal Achievement: 07/28/22 Potential to Achieve Goals: Fair  OT Frequency: Min 2X/week    Co-evaluation              AM-PAC OT "6 Clicks" Daily Activity     Outcome Measure Help from another person eating meals?: Total (NPO) Help from another person taking care of personal grooming?: A Little Help from another person toileting, which includes using toliet, bedpan, or urinal?: Total Help from another person bathing (including washing, rinsing, drying)?: A Lot Help from another person to put on and taking off regular upper body clothing?: A Little Help from another person to put on and taking off regular lower body clothing?: A Lot 6 Click Score: 12   End of Session Nurse Communication: Other (comment) (discussed with NT)  Activity Tolerance: Patient limited by fatigue Patient left: in bed;with call bell/phone within reach;with bed alarm set  OT Visit Diagnosis: Other abnormalities of gait and mobility (R26.89);Muscle weakness (generalized) (M62.81)                Time: [id number] OT Time Calculation (min): 15 min Charges:  OT General Charges $OT Visit: 1 Visit OT Evaluation $OT Eval Moderate Complexity: 1 Mod  Malachy Chamber, OTR/L Acute Rehab Services Office: [phone]   Layla Maw 07/14/2022, 2:20 PM

## 2022-07-14 NOTE — Assessment & Plan Note (Signed)
Observation med/surg bed. Pt states she takes no meds/foods by mouth. Given her level of hypernatremia and hyperchloremia, will try and hydrate her via PEG. PEG tube study shows that PEG is patent. Will start with free H2O via peg @ 100 ml/hr. This will avoid giving her additional sodium and chloride. If she does not tolerate free water via PEG, will change to D5W IVF @ 100 ml/hr. Repeat BMP in AM.  Pt living at home alone may not be the best situation for her. Will need TOC consult.

## 2022-07-15 ENCOUNTER — Other Ambulatory Visit: Payer: Self-pay

## 2022-07-15 ENCOUNTER — Inpatient Hospital Stay (HOSPITAL_COMMUNITY): Payer: Medicare Other

## 2022-07-15 DIAGNOSIS — I129 Hypertensive chronic kidney disease with stage 1 through stage 4 chronic kidney disease, or unspecified chronic kidney disease: Secondary | ICD-10-CM | POA: Diagnosis not present

## 2022-07-15 DIAGNOSIS — E782 Mixed hyperlipidemia: Secondary | ICD-10-CM | POA: Diagnosis not present

## 2022-07-15 DIAGNOSIS — E870 Hyperosmolality and hypernatremia: Secondary | ICD-10-CM | POA: Diagnosis not present

## 2022-07-15 LAB — GLUCOSE, CAPILLARY
Glucose-Capillary: 131 mg/dL — ABNORMAL HIGH (ref 70–99)
Glucose-Capillary: 138 mg/dL — ABNORMAL HIGH (ref 70–99)
Glucose-Capillary: 148 mg/dL — ABNORMAL HIGH (ref 70–99)
Glucose-Capillary: 155 mg/dL — ABNORMAL HIGH (ref 70–99)
Glucose-Capillary: 177 mg/dL — ABNORMAL HIGH (ref 70–99)
Glucose-Capillary: 177 mg/dL — ABNORMAL HIGH (ref 70–99)

## 2022-07-15 LAB — CBC
HCT: 36.1 % (ref 36.0–46.0)
Hemoglobin: 11.3 g/dL — ABNORMAL LOW (ref 12.0–15.0)
MCH: 28.2 pg (ref 26.0–34.0)
MCHC: 31.3 g/dL (ref 30.0–36.0)
MCV: 90 fL (ref 80.0–100.0)
Platelets: 202 10*3/uL (ref 150–400)
RBC: 4.01 MIL/uL (ref 3.87–5.11)
RDW: 13.5 % (ref 11.5–15.5)
WBC: 11.3 10*3/uL — ABNORMAL HIGH (ref 4.0–10.5)
nRBC: 0 % (ref 0.0–0.2)

## 2022-07-15 LAB — BASIC METABOLIC PANEL
Anion gap: 10 (ref 5–15)
BUN: 83 mg/dL — ABNORMAL HIGH (ref 8–23)
CO2: 21 mmol/L — ABNORMAL LOW (ref 22–32)
Calcium: 8.7 mg/dL — ABNORMAL LOW (ref 8.9–10.3)
Chloride: 117 mmol/L — ABNORMAL HIGH (ref 98–111)
Creatinine, Ser: 2.56 mg/dL — ABNORMAL HIGH (ref 0.44–1.00)
GFR, Estimated: 18 mL/min — ABNORMAL LOW (ref >60)
Glucose, Bld: 154 mg/dL — ABNORMAL HIGH (ref 70–99)
Potassium: 3.2 mmol/L — ABNORMAL LOW (ref 3.5–5.1)
Sodium: 150 mmol/L — ABNORMAL HIGH (ref 135–145)

## 2022-07-15 LAB — EXPECTORATED SPUTUM ASSESSMENT W GRAM STAIN, RFLX TO RESP C

## 2022-07-15 LAB — PHOSPHORUS
Phosphorus: 3.5 mg/dL (ref 2.5–4.6)
Phosphorus: 2.8 mg/dL (ref 2.5–4.6)

## 2022-07-15 LAB — MAGNESIUM
Magnesium: 2.8 mg/dL — ABNORMAL HIGH (ref 1.7–2.4)
Magnesium: 2.7 mg/dL — ABNORMAL HIGH (ref 1.7–2.4)

## 2022-07-15 MED ORDER — POTASSIUM CHLORIDE 20 MEQ PO PACK
40.0000 meq | PACK | ORAL | Status: AC
  Administered 2022-07-15 (×2): 40 meq
  Filled 2022-07-15 (×2): qty 2

## 2022-07-15 NOTE — TOC Initial Note (Signed)
Transition of Care [facility]) - Initial/Assessment Note    Patient Details  Name: [name] [name] MRN: [id number] Date of Birth: 11-Jan-1941  [facility] CM/SW Contact:    Jinger Neighbors, LCSW Phone Number: 07/15/2022, 2:15 PM  Clinical Narrative:                  CSW met with pt at bedside to discuss PT/OT recommendations. Pt asked CSW to call her dtr. CSW called dtr and left a voicemail. CSW completed FL2 and PASRR note, as pt has a hx of depression and will flag for [place] MUST. CSW received a returned call from dtr stating she would prefer for pt to return home with [facility]. CSW made RNCM aware.        Patient Goals and CMS Choice            Expected Discharge Plan and Services                                              Prior Living Arrangements/Services                       Activities of Daily Living Home Assistive Devices/Equipment: None ADL Screening (condition at time of admission) Patient's cognitive ability adequate to safely complete daily activities?: No Is the patient deaf or have difficulty hearing?: No Does the patient have difficulty seeing, even when wearing glasses/contacts?: No Does the patient have difficulty concentrating, remembering, or making decisions?: No Patient able to express need for assistance with ADLs?: Yes Does the patient have difficulty dressing or bathing?: Yes Independently performs ADLs?: No Communication: Independent Dressing (OT): Needs assistance Grooming: Needs assistance Feeding: Independent Toileting: Needs assistance Walks in Home: Independent Does the patient have difficulty walking or climbing stairs?: Yes Weakness of Legs: Both Weakness of Arms/Hands: Both  Permission Sought/Granted                  Emotional Assessment              Admission diagnosis:  Dehydration [E86.0] Hypernatremia [E87.0] Renal failure, unspecified chronicity [N19] Acute renal failure superimposed on stage 4  chronic kidney disease ([place]) [N17.9, N18.4] Patient Active Problem List   Diagnosis Date Noted   Acute renal failure superimposed on stage 4 chronic kidney disease ([place]) 07/14/2022   Hypernatremia    Chronic obstructive pulmonary disease ([place]) 01/23/2021   Severe protein-calorie malnutrition ([place]) 12/25/2020   Underweight 12/24/2020   Frailty syndrome in geriatric patient 12/24/2020   Emphysema lung ([place]) 12/24/2020   PEG (percutaneous endoscopic gastrostomy) status ([place]) 12/24/2020   Dysphagia, pharyngoesophageal phase    Protein-calorie malnutrition, severe 12/16/2020   Weight loss, non-intentional 12/15/2020   Cachexia ([place]) 12/15/2020   Chronic kidney disease, stage 4 (severe) ([place]) 05/16/2020   Acquired hypothyroidism 05/16/2020   Mixed hyperlipidemia 05/16/2020   Carotid artery disease ([place]) 08/13/2019   PVD (peripheral vascular disease) ([place])    HTN (hypertension)    ATHEROSLERO NATV ART EXTREM W/INTERMIT CLAUDICAT 12/22/2009   PCP:  Maryella Shivers, MD Pharmacy:   [facility] [place], [place] - [address] AT [address] [place] [address] Phone: [phone] [phone]     Social Determinants of Health (SDOH) Social History: SDOH Screenings   Food Insecurity: No Food Insecurity (07/14/2022)  Housing: Low Risk  (07/14/2022)  Transportation Needs: No Transportation Needs (07/14/2022)  Utilities: Not At Risk (07/14/2022)  Tobacco Use: Medium Risk (01/24/2021)   SDOH Interventions:     Readmission Risk Interventions     No data to display

## 2022-07-15 NOTE — TOC PASRR Note (Signed)
..  re: [name] [name] Date of birth: Nov 15, 1940 Date: 07/15/2022  TO WHOM IT MAY CONCERN:  Please be advised that the above named patient will require a short term nursing home stay, anticipated 30 days or less for rehabilitation and strengthening. The plan is for return home.

## 2022-07-15 NOTE — Progress Notes (Signed)
Mobility Specialist - Progress Note   07/15/22 0929  Mobility  Activity Dangled on edge of bed  Level of Assistance Moderate assist, patient does 50-74%  Activity Response Tolerated fair  Mobility Referral Yes  $Mobility charge 1 Mobility   Pt was received in bed and agreeable to mobility. Pt was able to come to EOB with MinA. Pt was able to do x1 Sit and stand with ModA. Pt was returned to bed with all needs met.  Franki Monte  Mobility Specialist Please contact via Solicitor or Rehab office at [phone]

## 2022-07-15 NOTE — Progress Notes (Signed)
PROGRESS NOTE    [name]  [id number] DOB: 07-29-1940 DOA: 07/13/2022 PCP: Maryella Shivers, MD    Brief Narrative:   [name] [name] is a 82 y.o. female with past medical history significant for hypothyroidism, CKD stage IV, coronary artery disease, PVD, COPD, dysphagia/severe protein calorie malnutrition s/p PEG tube who presented to [facility] on 2/28 via EMS from home with generalized weakness and decreased intake via PEG tube with concern for malfunction.  Daughter reports that patient has been feeling "full" and subsequently has been taking the last tube feeds and water intake via PEG tube.  Patient unable to fully participate given poor historical recall.  In the ED, temperature 97.5 F, HR 102, RR 19, BP 90/76, SpO2 97% room air.  WBC 11.0, hemoglobin 13.1, platelets 233.  Sodium 157, potassium 4.2, chloride 121, CO2 22, glucose 120, BUN 104, creatinine 3.07.  Alysis with small leukocytes, negative nitrite, rare bacteria, 0-5 WBCs.  Abdominal x-ray/PEG tube evaluation with nonobstructive bowel gas pattern, contrast within the stomach.  TRH consulted for further evaluation management of acute on chronic renal failure, hypernatremia secondary to dehydration and generalized weakness.  Assessment & Plan:   Hypernatremia Patient presenting with a sodium 137 in the setting of poor intake via PEG tube over the preceding days of hospitalization.  Likely severe dehydration. -- Na [id number] -- Restarted on tube feeds 2/29; tolerating, continue titration to goal -- D5W at 75 mL/h -- BMP daily  Acute renal failure on CKD stage IV Baseline creatinine 1.9 to from September 2022.  Creatinine on admission elevated to 3.09.  Suspect etiology likely secondary to severe dehydration given decreased tube feeds and water per feeding tube outpatient per daughter. -- Cr 3.07>2.88>2.56 -- Restart on tube feeds, continue D5W at 75 mL/h -- Avoid nephrotoxins, renal dose all  medications -- BMP daily  Acquired hypothyroidism TSH 0.100, free T4 1.60 -- Continue levothyroxine 50 mcg per tube daily  COPD -- Albuterol neb every 4 hours as needed shortness of breath/wheezing  Hyperlipidemia CAD/PVD --Atorvastatin 40 mg per tube daily  Dysphagia s/p PEG tube Severe protein calorie malnutrition Adult failure to thrive PEG tube study shows that PEG is in proper location and functioning normally.  Dietitian consulted; and restarted on tube feeds.  Nutrition Status: Nutrition Problem: Severe Malnutrition Etiology: chronic illness Signs/Symptoms: severe fat depletion, severe muscle depletion Interventions: Tube feeding    Weakness/debility/deconditioning: --PT/OT recommend SNF placement, TOC consulted   DVT prophylaxis: heparin injection 5,000 Units Start: 07/14/22 0600 SCDs Start: 07/14/22 0437    Code Status: DNR Family Communication: Updated daughter present at bedside yesterday morning, no family present at bedside this morning  Disposition Plan:  Level of care: Med-Surg Status is: Inpatient Remains inpatient appropriate because: Needs further up titration of tube feeds with toleration, SNF placement   Consultants:  None  Procedures:  None  Antimicrobials:  None   Subjective: Patient seen examined bedside, resting comfortably.  Lying in bed.  No family present.  No specific complaints this morning.  Restarted on tube feeds yesterday with toleration, needs further up titration to goal.  Sodium and renal function improving.  No other questions or concerns at this time. Denies headache, no chest pain, no shortness of breath, no abdominal pain, no fever, no nausea/vomiting/diarrhea.  No acute events overnight per nursing staff.  Objective: Vitals:   07/14/22 1636 07/14/22 2034 07/15/22 0539 07/15/22 0810  BP: (!) 143/68 92/70 103/76 91/66  Pulse: 94 93 96 96  Resp:  18  16 20  Temp:  98.1 F (36.7 C) 97.7 F (36.5 C) 97.7 F (36.5 C)   TempSrc:  Oral Oral Oral  SpO2: 99% 100% 97% 96%   No intake or output data in the 24 hours ending 07/15/22 1000 There were no vitals filed for this visit.  Examination:  Physical Exam: GEN: NAD, alert and oriented x 3, thin/cachectic in appearance HEENT: NCAT, PERRL, EOMI, sclera clear, dry mucous membranes PULM: Coarse breath sounds bilaterally, no wheezing, normal respiratory effort room air CV: RRR w/o M/G/R GI: abd soft, NTND, NABS, PEG tube noted in place with tube feeds infusing MSK: no peripheral edema, moves all extremity independently NEURO: No focal deficits PSYCH: Depressed mood, flat affect Integumentary: No visible rashes/lesions/wounds noted on exposed skin surfaces    Data Reviewed: I have personally reviewed following labs and imaging studies  CBC: Recent Labs  Lab 07/13/22 2015 07/14/22 0605 07/15/22 0807  WBC 11.0* 11.0* 11.3*  NEUTROABS 7.1 7.6  --   HGB 13.1 12.5 11.3*  HCT 42.1 40.9 36.1  MCV 91.5 90.5 90.0  PLT 233 249 [id number]   Basic Metabolic Panel: Recent Labs  Lab 07/13/22 2015 07/14/22 0605 07/14/22 1132 07/14/22 1433 07/14/22 1814 07/15/22 0807  NA 157* 158*  --  157*  --  150*  K 4.2 3.7  --   --   --  3.2*  CL 121* 122*  --   --   --  117*  CO2 22 23  --   --   --  21*  GLUCOSE 120* 108*  --   --   --  154*  BUN 104* 97*  --   --   --  83*  CREATININE 3.07* 2.88*  --   --   --  2.56*  CALCIUM 9.3 9.3  --   --   --  8.7*  MG  --  3.2* 3.3*  --  3.2* 2.8*  PHOS  --   --  4.9*  --  4.8* 3.5   GFR: CrCl cannot be calculated (Unknown ideal weight.). Liver Function Tests: Recent Labs  Lab 07/14/22 0605  AST 55*  ALT 47*  ALKPHOS 53  BILITOT 0.7  PROT 7.5  ALBUMIN 3.2*   No results for input(s): "LIPASE", "AMYLASE" in the last 168 hours. No results for input(s): "AMMONIA" in the last 168 hours. Coagulation Profile: No results for input(s): "INR", "PROTIME" in the last 168 hours. Cardiac Enzymes: No results for input(s):  "CKTOTAL", "CKMB", "CKMBINDEX", "TROPONINI" in the last 168 hours. BNP (last 3 results) No results for input(s): "PROBNP" in the last 8760 hours. HbA1C: No results for input(s): "HGBA1C" in the last 72 hours. CBG: Recent Labs  Lab 07/14/22 1700 07/14/22 2102 07/15/22 0003 07/15/22 0558 07/15/22 0807  GLUCAP 155* 185* 177* 177* 148*   Lipid Profile: No results for input(s): "CHOL", "HDL", "LDLCALC", "TRIG", "CHOLHDL", "LDLDIRECT" in the last 72 hours. Thyroid Function Tests: Recent Labs    07/13/22 2015 07/14/22 0605  TSH 0.100*  --   FREET4  --  1.60*   Anemia Panel: No results for input(s): "VITAMINB12", "FOLATE", "FERRITIN", "TIBC", "IRON", "RETICCTPCT" in the last 72 hours. Sepsis Labs: No results for input(s): "PROCALCITON", "LATICACIDVEN" in the last 168 hours.  No results found for this or any previous visit (from the past 240 hour(s)).       Radiology Studies: DG ABDOMEN PEG TUBE LOCATION  Result Date: 07/14/2022 CLINICAL DATA:  Epigastric pain EXAM: ABDOMEN - 1 VIEW  COMPARISON:  12/18/2020 FINDINGS: Nonobstructive bowel-gas pattern. No radiopaque calculi. Contrast within the stomach. IMPRESSION: Negative. Electronically Signed   By: Placido Sou M.D.   On: 07/14/2022 00:22        Scheduled Meds:  heparin  5,000 Units Subcutaneous Q8H   levothyroxine  75 mcg Per Tube Daily   potassium chloride  40 mEq Per Tube Q4H   rosuvastatin  40 mg Per Tube Daily   thiamine  100 mg Per Tube Daily   Continuous Infusions:  dextrose 75 mL/hr at 07/15/22 0317   feeding supplement (JEVITY 1.5 CAL/FIBER) 1,000 mL (07/14/22 1213)   free water       LOS: 1 day    Time spent: 52 minutes spent on chart review, discussion with nursing staff, consultants, updating family and interview/physical exam; more than 50% of that time was spent in counseling and/or coordination of care.    Atziri Zubiate J [place] DO [facility] Available via Epic secure chat 7am-7pm After  these hours, please refer to coverage provider listed on [email or web address] 07/15/2022, 10:00 AM

## 2022-07-15 NOTE — TOC Progression Note (Signed)
Transition of Care [facility]) - Progression Note    Patient Details  Name: [name] [name] MRN: [id number] Date of Birth: [birth year]  Transition of Care [facility]) CM/SW Contact  Curlene Labrum, RN Phone Number: 07/15/2022, 3:01 PM  Clinical Narrative:    CM called and spoke with the patient's daughter, Forde Radon, by phone and she states that she will speak with her husband about patient's condition and likely need for SNF placement.  The patient lives alone at the home and the daughter lives in the area but was unaware that patient needed 24 supervision and care in the home.  The daughter was updated that if she chose to take the patient home with home health services that she would need to provide 24 hour care due to her total care needs and safety.  The patient's daughter was agreeable to fax her out in the hub for SNF placement availability and bed offers.  [place] MSW updated and will complete the SNF work up.          Expected Discharge Plan and Services                                               Social Determinants of Health (SDOH) Interventions SDOH Screenings   Food Insecurity: No Food Insecurity (07/14/2022)  Housing: Low Risk  (07/14/2022)  Transportation Needs: No Transportation Needs (07/14/2022)  Utilities: Not At Risk (07/14/2022)  Tobacco Use: Medium Risk (01/24/2021)    Readmission Risk Interventions     No data to display

## 2022-07-15 NOTE — NC FL2 (Signed)
[place] LEVEL OF CARE FORM     IDENTIFICATION  Patient Name: [name] [name] Birthdate: Feb 08, 1941 Sex: female Admission Date (Current Location): 07/13/2022  [facility] and [place] Number:  Herbalist and Address:  [facility] [place], [place] [address] [place], [place] [address]      Provider Number: [id number]  Attending Physician Name and Address:  [place] Eric J, DO  Relative Name and Phone Number:  [name] [phone]    Current Level of Care: Hospital Recommended Level of Care: [place] Prior Approval Number:    Date Approved/Denied:   PASRR Number:    Discharge Plan: SNF    Current Diagnoses: Patient Active Problem List   Diagnosis Date Noted   Acute renal failure superimposed on stage 4 chronic kidney disease ([place]) 07/14/2022   Hypernatremia    Chronic obstructive pulmonary disease ([place]) 01/23/2021   Severe protein-calorie malnutrition ([place]) 12/25/2020   Underweight 12/24/2020   Frailty syndrome in geriatric patient 12/24/2020   Emphysema lung ([place]) 12/24/2020   PEG (percutaneous endoscopic gastrostomy) status ([place]) 12/24/2020   Dysphagia, pharyngoesophageal phase    Protein-calorie malnutrition, severe 12/16/2020   Weight loss, non-intentional 12/15/2020   Cachexia ([place]) 12/15/2020   Chronic kidney disease, stage 4 (severe) ([place]) 05/16/2020   Acquired hypothyroidism 05/16/2020   Mixed hyperlipidemia 05/16/2020   Carotid artery disease ([place]) 08/13/2019   PVD (peripheral vascular disease) ([place])    HTN (hypertension)    ATHEROSLERO NATV ART EXTREM W/INTERMIT CLAUDICAT 12/22/2009    Orientation RESPIRATION BLADDER Height & Weight     Self  Normal Continent, External catheter Weight:   Height:     BEHAVIORAL SYMPTOMS/MOOD NEUROLOGICAL BOWEL NUTRITION STATUS      Incontinent Feeding tube  AMBULATORY STATUS COMMUNICATION OF NEEDS Skin   Extensive Assist  (imapired voice, but does try to speak)  Normal                       Personal Care Assistance Level of Assistance  Bathing, Feeding, Dressing Bathing Assistance: Limited assistance Feeding assistance:  (pt on tube feeds) Dressing Assistance: Maximum assistance     Functional Limitations Info  Sight, Hearing, Speech Sight Info: Adequate Hearing Info: Adequate Speech Info: Impaired    SPECIAL CARE FACTORS FREQUENCY  OT (By licensed OT), PT (By licensed PT)     PT Frequency: 5x/wk OT Frequency: 5x/wk            Contractures Contractures Info: Not present    Additional Factors Info  Code Status Code Status Info: DNR             Current Medications (07/15/2022):  This is the current hospital active medication list Current Facility-Administered Medications  Medication Dose Route Frequency Provider Last Rate Last Admin   acetaminophen (TYLENOL) 160 MG/5ML solution 650 mg  650 mg Per Tube Q4H PRN Kristopher Oppenheim, DO       albuterol (PROVENTIL) (2.5 MG/3ML) 0.083% nebulizer solution 2.5 mg  2.5 mg Nebulization Q4H PRN [place] Eric J, DO       dextrose 5 % solution   Intravenous Continuous [place] Eric J, DO 75 mL/hr at 07/15/22 0317 New Bag at 07/15/22 0317   feeding supplement (JEVITY 1.5 CAL/FIBER) liquid 1,000 mL  1,000 mL Per Tube Continuous [place] Eric J, DO 55 mL/hr at 07/14/22 1213 1,000 mL at 07/14/22 1213   free water 100 mL  100 mL Per Tube Continuous Kristopher Oppenheim, DO  100 mL at 07/14/22 0258   guaiFENesin-dextromethorphan (ROBITUSSIN DM) 100-10 MG/5ML syrup 5 mL  5 mL Per Tube Q4H PRN [place] Eric J, DO   5 mL at 07/15/22 1245   heparin injection 5,000 Units  5,000 Units Subcutaneous Q8H Kristopher Oppenheim, DO   5,000 Units at 07/15/22 1405   levothyroxine (SYNTHROID) tablet 75 mcg  75 mcg Per Tube Daily [place] Eric J, DO   75 mcg at 07/15/22 0539   ondansetron (ZOFRAN) injection 4 mg  4 mg Intravenous Q6H PRN Kristopher Oppenheim, DO       potassium chloride (KLOR-CON) packet 40 mEq  40 mEq Per Tube Q4H [place] Donnamarie Poag, DO   40 mEq at 07/15/22 1009   rosuvastatin (CRESTOR) tablet 40 mg  40 mg Per Tube Daily [place] Eric J, DO   40 mg at 07/15/22 1005   thiamine (VITAMIN B1) tablet 100 mg  100 mg Per Tube Daily [place] Donnamarie Poag, DO   100 mg at 07/15/22 1006     Discharge Medications: Please see discharge summary for a list of discharge medications.  Relevant Imaging Results:  Relevant Lab Results:   Additional Information [id number]  Jinger Neighbors, LCSW

## 2022-07-16 DIAGNOSIS — E870 Hyperosmolality and hypernatremia: Secondary | ICD-10-CM | POA: Diagnosis not present

## 2022-07-16 LAB — BASIC METABOLIC PANEL
Anion gap: 12 (ref 5–15)
BUN: 65 mg/dL — ABNORMAL HIGH (ref 8–23)
CO2: 19 mmol/L — ABNORMAL LOW (ref 22–32)
Calcium: 8.4 mg/dL — ABNORMAL LOW (ref 8.9–10.3)
Chloride: 111 mmol/L (ref 98–111)
Creatinine, Ser: 2.26 mg/dL — ABNORMAL HIGH (ref 0.44–1.00)
GFR, Estimated: 21 mL/min — ABNORMAL LOW (ref >60)
Glucose, Bld: 123 mg/dL — ABNORMAL HIGH (ref 70–99)
Potassium: 4.6 mmol/L (ref 3.5–5.1)
Sodium: 142 mmol/L (ref 135–145)

## 2022-07-16 LAB — GLUCOSE, CAPILLARY
Glucose-Capillary: 135 mg/dL — ABNORMAL HIGH (ref 70–99)
Glucose-Capillary: 152 mg/dL — ABNORMAL HIGH (ref 70–99)
Glucose-Capillary: 148 mg/dL — ABNORMAL HIGH (ref 70–99)
Glucose-Capillary: 177 mg/dL — ABNORMAL HIGH (ref 70–99)
Glucose-Capillary: 178 mg/dL — ABNORMAL HIGH (ref 70–99)

## 2022-07-16 NOTE — Progress Notes (Signed)
PROGRESS NOTE    [name] [name]  [id number] DOB: 03/03/1941 DOA: 07/13/2022 PCP: Maryella Shivers, MD    Brief Narrative:   [name] [name] is a 83 y.o. female with past medical history significant for hypothyroidism, CKD stage IV, coronary artery disease, PVD, COPD, dysphagia/severe protein calorie malnutrition s/p PEG tube who presented to [facility] on 2/28 via EMS from home with generalized weakness and decreased intake via PEG tube with concern for malfunction.  Daughter reports that patient has been feeling "full" and subsequently has been taking the last tube feeds and water intake via PEG tube.  Patient unable to fully participate given poor historical recall.  In the ED, temperature 97.5 F, HR 102, RR 19, BP 90/76, SpO2 97% room air.  WBC 11.0, hemoglobin 13.1, platelets 233.  Sodium 157, potassium 4.2, chloride 121, CO2 22, glucose 120, BUN 104, creatinine 3.07.  Alysis with small leukocytes, negative nitrite, rare bacteria, 0-5 WBCs.  Abdominal x-ray/PEG tube evaluation with nonobstructive bowel gas pattern, contrast within the stomach.  TRH consulted for further evaluation management of acute on chronic renal failure, hypernatremia secondary to dehydration and generalized weakness.  Assessment & Plan:   Hypernatremia Patient presenting with a sodium 137 in the setting of poor intake via PEG tube over the preceding days of hospitalization.  Likely severe dehydration. -- Na 157>158>150> Labs pending today -- Restarted on tube feeds 2/29; tolerating, continue titration to goal -- D5W at 75 mL/h -- BMP daily  Acute renal failure on CKD stage IV Baseline creatinine 1.9 to from September 2022.  Creatinine on admission elevated to 3.09.  Suspect etiology likely secondary to severe dehydration given decreased tube feeds and water per feeding tube outpatient per daughter. -- Cr 3.07>2.88>2.56> Labs pending today -- Restart on tube feeds, continue D5W at 75 mL/h -- Avoid  nephrotoxins, renal dose all medications -- BMP daily  Acquired hypothyroidism TSH 0.100, free T4 1.60 -- Continue levothyroxine 50 mcg per tube daily  COPD -- Albuterol neb every 4 hours as needed shortness of breath/wheezing  Hyperlipidemia CAD/PVD --Atorvastatin 40 mg per tube daily  Dysphagia s/p PEG tube Severe protein calorie malnutrition Adult failure to thrive PEG tube study shows that PEG is in proper location and functioning normally.  Dietitian consulted; and restarted on tube feeds.  Nutrition Status: Nutrition Problem: Severe Malnutrition Etiology: chronic illness Signs/Symptoms: severe fat depletion, severe muscle depletion Interventions: Tube feeding    Weakness/debility/deconditioning: --PT/OT recommend SNF placement, TOC consulted   DVT prophylaxis: heparin injection 5,000 Units Start: 07/14/22 0600 SCDs Start: 07/14/22 0437    Code Status: DNR Family Communication: No family present at bedside this morning  Disposition Plan:  Level of care: Med-Surg Status is: Inpatient Remains inpatient appropriate because: Pending SNF placement   Consultants:  None  Procedures:  None  Antimicrobials:  None   Subjective: Patient seen examined bedside, resting comfortably.  Lying in bed.  No family present.  No specific complaints this morning.  Labs pending for today.  Continues on tube feeds and tolerating up titration.  No specific questions or concerns at this time.  Pending SNF placement. Denies headache, no chest pain, no shortness of breath, no abdominal pain, no fever, no nausea/vomiting/diarrhea.  No acute events overnight per nursing staff.  Objective: Vitals:   07/15/22 1558 07/15/22 1939 07/15/22 2245 07/16/22 0757  BP: (!) 128/54 (!) 71/53 (!) 72/51 131/61  Pulse: 96 96 85 89  Resp: '16 17 17 16  '$ Temp: 97.7 F (36.5 C) 97.7  F (36.5 C) 98.3 F (36.8 C) 98.5 F (36.9 C)  TempSrc:  Oral Oral Oral  SpO2: 99% 97% 92% 98%   No intake or  output data in the 24 hours ending 07/16/22 0924 There were no vitals filed for this visit.  Examination:  Physical Exam: GEN: NAD, alert and oriented x 3, thin/cachectic in appearance HEENT: NCAT, PERRL, EOMI, sclera clear, dry mucous membranes PULM: Coarse breath sounds bilaterally, no wheezing, normal respiratory effort room air CV: RRR w/o M/G/R GI: abd soft, NTND, NABS, PEG tube noted in place with tube feeds infusing MSK: no peripheral edema, moves all extremity independently NEURO: No focal deficits PSYCH: Depressed mood, flat affect Integumentary: No visible rashes/lesions/wounds noted on exposed skin surfaces    Data Reviewed: I have personally reviewed following labs and imaging studies  CBC: Recent Labs  Lab 07/13/22 2015 07/14/22 0605 07/15/22 0807  WBC 11.0* 11.0* 11.3*  NEUTROABS 7.1 7.6  --   HGB 13.1 12.5 11.3*  HCT 42.1 40.9 36.1  MCV 91.5 90.5 90.0  PLT 233 249 [id number]   Basic Metabolic Panel: Recent Labs  Lab 07/13/22 2015 07/14/22 0605 07/14/22 1132 07/14/22 1433 07/14/22 1814 07/15/22 0807 07/15/22 1807  NA 157* 158*  --  157*  --  150*  --   K 4.2 3.7  --   --   --  3.2*  --   CL 121* 122*  --   --   --  117*  --   CO2 22 23  --   --   --  21*  --   GLUCOSE 120* 108*  --   --   --  154*  --   BUN 104* 97*  --   --   --  83*  --   CREATININE 3.07* 2.88*  --   --   --  2.56*  --   CALCIUM 9.3 9.3  --   --   --  8.7*  --   MG  --  3.2* 3.3*  --  3.2* 2.8* 2.7*  PHOS  --   --  4.9*  --  4.8* 3.5 2.8   GFR: CrCl cannot be calculated (Unknown ideal weight.). Liver Function Tests: Recent Labs  Lab 07/14/22 0605  AST 55*  ALT 47*  ALKPHOS 53  BILITOT 0.7  PROT 7.5  ALBUMIN 3.2*   No results for input(s): "LIPASE", "AMYLASE" in the last 168 hours. No results for input(s): "AMMONIA" in the last 168 hours. Coagulation Profile: No results for input(s): "INR", "PROTIME" in the last 168 hours. Cardiac Enzymes: No results for input(s):  "CKTOTAL", "CKMB", "CKMBINDEX", "TROPONINI" in the last 168 hours. BNP (last 3 results) No results for input(s): "PROBNP" in the last 8760 hours. HbA1C: No results for input(s): "HGBA1C" in the last 72 hours. CBG: Recent Labs  Lab 07/15/22 1142 07/15/22 1701 07/15/22 2326 07/16/22 0517 07/16/22 0819  GLUCAP 131* 155* 138* 135* 152*   Lipid Profile: No results for input(s): "CHOL", "HDL", "LDLCALC", "TRIG", "CHOLHDL", "LDLDIRECT" in the last 72 hours. Thyroid Function Tests: Recent Labs    07/13/22 2015 07/14/22 0605  TSH 0.100*  --   FREET4  --  1.60*   Anemia Panel: No results for input(s): "VITAMINB12", "FOLATE", "FERRITIN", "TIBC", "IRON", "RETICCTPCT" in the last 72 hours. Sepsis Labs: No results for input(s): "PROCALCITON", "LATICACIDVEN" in the last 168 hours.  Recent Results (from the past 240 hour(s))  Expectorated Sputum Assessment w Gram Stain, Rflx to Resp Cult  Status: None   Collection Time: 07/15/22 11:02 AM   Specimen: Expectorated Sputum  Result Value Ref Range Status   Specimen Description EXPECTORATED SPUTUM  Final   Special Requests NONE  Final   Sputum evaluation   Final    Sputum specimen not acceptable for testing.  Please recollect.    Willette Brace, RN 07/15/22 1602 A. LAFRANCE Performed at [facility], [place] [address], [place], [place] [address]    Report Status 07/15/2022 FINAL  Final         Radiology Studies: DG CHEST PORT 1 VIEW  Result Date: 07/15/2022 CLINICAL DATA:  Shortness of breath. EXAM: PORTABLE CHEST 1 VIEW COMPARISON:  January 24, 2021. FINDINGS: The heart size and mediastinal contours are within normal limits. Both lungs are clear. The visualized skeletal structures are unremarkable. IMPRESSION: No active disease. Aortic Atherosclerosis (ICD10-I70.0). Electronically Signed   By: Marijo Conception M.D.   On: 07/15/2022 11:56        Scheduled Meds:  heparin  5,000 Units Subcutaneous Q8H   levothyroxine  75 mcg Per  Tube Daily   rosuvastatin  40 mg Per Tube Daily   thiamine  100 mg Per Tube Daily   Continuous Infusions:  dextrose 75 mL/hr at 07/15/22 2016   feeding supplement (JEVITY 1.5 CAL/FIBER) 1,000 mL (07/15/22 2213)   free water       LOS: 2 days    Time spent: 52 minutes spent on chart review, discussion with nursing staff, consultants, updating family and interview/physical exam; more than 50% of that time was spent in counseling and/or coordination of care.    Vera Wishart J [place] DO [facility] Available via Epic secure chat 7am-7pm After these hours, please refer to coverage provider listed on [email or web address] 07/16/2022, 9:24 AM

## 2022-07-16 NOTE — Progress Notes (Signed)
Spoke to pt's dtr who is requesting SNF placement in [facility] or [place]. SNF search expanded to include family preferences. Will f/u with offers as available.   Wandra Feinstein, MSW, LCSW [phone] (coverage)

## 2022-07-16 NOTE — Progress Notes (Signed)
Mobility Specialist Progress Note   07/16/22 1600  Mobility  Activity Transferred to/from [facility]  Level of Assistance Minimal assist, patient does 75% or more  Assistive Device Front wheel walker  Distance Ambulated (ft) 2 ft  Activity Response Tolerated well  Mobility Referral Yes  $Mobility charge 1 Mobility   Pt in bed agreeable to mobility. Rattle like cough throughout session. Able to get EOB w/ minA supporting trunk using chuck pad. Stood w/ minA but pt unable to stand fully up right remaining in flexed trunk position. Once standing, pt has an unexpected BM and request to sit on BSC. Pt able to pivot to [facility] w/ increased time + directional cues. Left on the [facility] w/ NT present in the room.     Holland Falling Mobility Specialist Please contact via SecureChat or  Rehab office at [phone]

## 2022-07-17 DIAGNOSIS — E870 Hyperosmolality and hypernatremia: Secondary | ICD-10-CM | POA: Diagnosis not present

## 2022-07-17 LAB — BASIC METABOLIC PANEL
Anion gap: 7 (ref 5–15)
BUN: 54 mg/dL — ABNORMAL HIGH (ref 8–23)
CO2: 24 mmol/L (ref 22–32)
Calcium: 8.2 mg/dL — ABNORMAL LOW (ref 8.9–10.3)
Chloride: 108 mmol/L (ref 98–111)
Creatinine, Ser: 2.08 mg/dL — ABNORMAL HIGH (ref 0.44–1.00)
GFR, Estimated: 23 mL/min — ABNORMAL LOW (ref >60)
Glucose, Bld: 153 mg/dL — ABNORMAL HIGH (ref 70–99)
Potassium: 3.7 mmol/L (ref 3.5–5.1)
Sodium: 139 mmol/L (ref 135–145)

## 2022-07-17 LAB — GLUCOSE, CAPILLARY
Glucose-Capillary: 135 mg/dL — ABNORMAL HIGH (ref 70–99)
Glucose-Capillary: 135 mg/dL — ABNORMAL HIGH (ref 70–99)
Glucose-Capillary: 139 mg/dL — ABNORMAL HIGH (ref 70–99)
Glucose-Capillary: 148 mg/dL — ABNORMAL HIGH (ref 70–99)
Glucose-Capillary: 194 mg/dL — ABNORMAL HIGH (ref 70–99)

## 2022-07-17 NOTE — Progress Notes (Signed)
Patient stated she can not cough up her secretions. I did try to suction her mouth after asking her to cough hard for me. Only a little bit of mucus came up. I messaged MD for [facility] consult. I called [facility] to let them know she may possibly need deep suction and has a breathing treatment prn. They said they would come assess shortly.

## 2022-07-17 NOTE — Progress Notes (Signed)
PROGRESS NOTE    [name] [name]  [id number] DOB: 1940/11/13 DOA: 07/13/2022 PCP: Maryella Shivers, MD    Brief Narrative:   [name] [name] is a 82 y.o. female with past medical history significant for hypothyroidism, CKD stage IV, coronary artery disease, PVD, COPD, dysphagia/severe protein calorie malnutrition s/p PEG tube who presented to [facility] on 2/28 via EMS from home with generalized weakness and decreased intake via PEG tube with concern for malfunction.  Daughter reports that patient has been feeling "full" and subsequently has been taking the last tube feeds and water intake via PEG tube.  Patient unable to fully participate given poor historical recall.  In the ED, temperature 97.5 F, HR 102, RR 19, BP 90/76, SpO2 97% room air.  WBC 11.0, hemoglobin 13.1, platelets 233.  Sodium 157, potassium 4.2, chloride 121, CO2 22, glucose 120, BUN 104, creatinine 3.07.  Alysis with small leukocytes, negative nitrite, rare bacteria, 0-5 WBCs.  Abdominal x-ray/PEG tube evaluation with nonobstructive bowel gas pattern, contrast within the stomach.  TRH consulted for further evaluation management of acute on chronic renal failure, hypernatremia secondary to dehydration and generalized weakness.  Assessment & Plan:   Hypernatremia Patient presenting with a sodium 137 in the setting of poor intake via PEG tube over the preceding days of hospitalization.  Likely severe dehydration. -- Na 157>158>150> Labs pending today -- Restarted on tube feeds 2/29; tolerating, continue titration to goal -- D5W at 75 mL/h -- BMP daily  Acute renal failure on CKD stage IV Baseline creatinine 1.9 to from September 2022.  Creatinine on admission elevated to 3.09.  Suspect etiology likely secondary to severe dehydration given decreased tube feeds and water per feeding tube outpatient per daughter. -- Cr 3.07>2.88>2.56>2.26 Labs pending today -- Restarted on tube feeds, now at goal  -- Avoid  nephrotoxins, renal dose all medications -- BMP daily  Acquired hypothyroidism TSH 0.100, free T4 1.60 -- Continue levothyroxine 50 mcg per tube daily  COPD -- Albuterol neb every 4 hours as needed shortness of breath/wheezing  Hyperlipidemia CAD/PVD --Atorvastatin 40 mg per tube daily  Dysphagia s/p PEG tube Severe protein calorie malnutrition Adult failure to thrive PEG tube study shows that PEG is in proper location and functioning normally.  Dietitian consulted; and restarted on tube feeds.  Nutrition Status: Nutrition Problem: Severe Malnutrition Etiology: chronic illness Signs/Symptoms: severe fat depletion, severe muscle depletion Interventions: Tube feeding    Weakness/debility/deconditioning: --PT/OT recommend SNF placement, TOC consulted   DVT prophylaxis: heparin injection 5,000 Units Start: 07/14/22 0600 SCDs Start: 07/14/22 0437    Code Status: DNR Family Communication: No family present at bedside this morning  Disposition Plan:  Level of care: Med-Surg Status is: Inpatient Remains inpatient appropriate because: Pending SNF placement   Consultants:  None  Procedures:  None  Antimicrobials:  None   Subjective: Patient seen examined bedside, resting comfortably.  Lying in bed.  No family present.  No specific complaints this morning.  Labs pending for today.  Continues on tube feeds that are uptitrated to goal and tolerating.  No specific questions or concerns at this time.  Pending SNF placement. Denies headache, no chest pain, no shortness of breath, no abdominal pain, no fever, no nausea/vomiting/diarrhea.  No acute events overnight per nursing staff.  Objective: Vitals:   07/16/22 1606 07/16/22 1934 07/17/22 0417 07/17/22 0500  BP: 128/65 128/61 115/61   Pulse: 97 94 93   Resp: '18 16 16   '$ Temp: 98.4 F (36.9 C) 98.6 F (  37 C) 98.2 F (36.8 C)   TempSrc: Oral Oral Oral   SpO2: 96% 96% 95%   Weight:    54.9 kg    Intake/Output  Summary (Last 24 hours) at 07/17/2022 0914 Last data filed at 07/17/2022 0424 Gross per 24 hour  Intake 4427.61 ml  Output 2 ml  Net 4425.61 ml   Filed Weights   07/17/22 0500  Weight: 54.9 kg    Examination:  Physical Exam: GEN: NAD, alert and oriented x 3, thin/cachectic in appearance HEENT: NCAT, PERRL, EOMI, sclera clear, dry mucous membranes PULM: Coarse breath sounds bilaterally, no wheezing, normal respiratory effort room air CV: RRR w/o M/G/R GI: abd soft, NTND, NABS, PEG tube noted in place with tube feeds infusing MSK: no peripheral edema, moves all extremity independently NEURO: No focal deficits PSYCH: Depressed mood, flat affect Integumentary: No visible rashes/lesions/wounds noted on exposed skin surfaces    Data Reviewed: I have personally reviewed following labs and imaging studies  CBC: Recent Labs  Lab 07/13/22 2015 07/14/22 0605 07/15/22 0807  WBC 11.0* 11.0* 11.3*  NEUTROABS 7.1 7.6  --   HGB 13.1 12.5 11.3*  HCT 42.1 40.9 36.1  MCV 91.5 90.5 90.0  PLT 233 249 [id number]   Basic Metabolic Panel: Recent Labs  Lab 07/13/22 2015 07/14/22 0605 07/14/22 1132 07/14/22 1433 07/14/22 1814 07/15/22 0807 07/15/22 1807 07/16/22 1029  NA 157* 158*  --  157*  --  150*  --  142  K 4.2 3.7  --   --   --  3.2*  --  4.6  CL 121* 122*  --   --   --  117*  --  111  CO2 22 23  --   --   --  21*  --  19*  GLUCOSE 120* 108*  --   --   --  154*  --  123*  BUN 104* 97*  --   --   --  83*  --  65*  CREATININE 3.07* 2.88*  --   --   --  2.56*  --  2.26*  CALCIUM 9.3 9.3  --   --   --  8.7*  --  8.4*  MG  --  3.2* 3.3*  --  3.2* 2.8* 2.7*  --   PHOS  --   --  4.9*  --  4.8* 3.5 2.8  --    GFR: CrCl cannot be calculated (Unknown ideal weight.). Liver Function Tests: Recent Labs  Lab 07/14/22 0605  AST 55*  ALT 47*  ALKPHOS 53  BILITOT 0.7  PROT 7.5  ALBUMIN 3.2*   No results for input(s): "LIPASE", "AMYLASE" in the last 168 hours. No results for input(s):  "AMMONIA" in the last 168 hours. Coagulation Profile: No results for input(s): "INR", "PROTIME" in the last 168 hours. Cardiac Enzymes: No results for input(s): "CKTOTAL", "CKMB", "CKMBINDEX", "TROPONINI" in the last 168 hours. BNP (last 3 results) No results for input(s): "PROBNP" in the last 8760 hours. HbA1C: No results for input(s): "HGBA1C" in the last 72 hours. CBG: Recent Labs  Lab 07/16/22 1212 07/16/22 1559 07/16/22 2037 07/17/22 0008 07/17/22 0410  GLUCAP 148* 177* 178* 194* 148*   Lipid Profile: No results for input(s): "CHOL", "HDL", "LDLCALC", "TRIG", "CHOLHDL", "LDLDIRECT" in the last 72 hours. Thyroid Function Tests: No results for input(s): "TSH", "T4TOTAL", "FREET4", "T3FREE", "THYROIDAB" in the last 72 hours.  Anemia Panel: No results for input(s): "VITAMINB12", "FOLATE", "FERRITIN", "TIBC", "IRON", "RETICCTPCT" in the  last 72 hours. Sepsis Labs: No results for input(s): "PROCALCITON", "LATICACIDVEN" in the last 168 hours.  Recent Results (from the past 240 hour(s))  Expectorated Sputum Assessment w Gram Stain, Rflx to Resp Cult     Status: None   Collection Time: 07/15/22 11:02 AM   Specimen: Expectorated Sputum  Result Value Ref Range Status   Specimen Description EXPECTORATED SPUTUM  Final   Special Requests NONE  Final   Sputum evaluation   Final    Sputum specimen not acceptable for testing.  Please recollect.    Willette Brace, RN 07/15/22 1602 A. LAFRANCE Performed at [facility], [place] [address] [place], [place] [address]    Report Status 07/15/2022 FINAL  Final         Radiology Studies: DG CHEST PORT 1 VIEW  Result Date: 07/15/2022 CLINICAL DATA:  Shortness of breath. EXAM: PORTABLE CHEST 1 VIEW COMPARISON:  January 24, 2021. FINDINGS: The heart size and mediastinal contours are within normal limits. Both lungs are clear. The visualized skeletal structures are unremarkable. IMPRESSION: No active disease. Aortic Atherosclerosis  (ICD10-I70.0). Electronically Signed   By: Marijo Conception M.D.   On: 07/15/2022 11:56        Scheduled Meds:  heparin  5,000 Units Subcutaneous Q8H   levothyroxine  75 mcg Per Tube Daily   rosuvastatin  40 mg Per Tube Daily   thiamine  100 mg Per Tube Daily   Continuous Infusions:  dextrose Stopped (07/17/22 0005)   feeding supplement (JEVITY 1.5 CAL/FIBER) 1,000 mL (07/15/22 2213)   free water       LOS: 3 days    Time spent: 50 minutes spent on chart review, discussion with nursing staff, consultants, updating family and interview/physical exam; more than 50% of that time was spent in counseling and/or coordination of care.    Tavarus Poteete J [place] DO [facility] Available via Epic secure chat 7am-7pm After these hours, please refer to coverage provider listed on [email or web address] 07/17/2022, 9:14 AM

## 2022-07-18 DIAGNOSIS — E870 Hyperosmolality and hypernatremia: Secondary | ICD-10-CM | POA: Diagnosis not present

## 2022-07-18 LAB — BASIC METABOLIC PANEL
Anion gap: 8 (ref 5–15)
BUN: 48 mg/dL — ABNORMAL HIGH (ref 8–23)
CO2: 26 mmol/L (ref 22–32)
Calcium: 8.5 mg/dL — ABNORMAL LOW (ref 8.9–10.3)
Chloride: 106 mmol/L (ref 98–111)
Creatinine, Ser: 2.05 mg/dL — ABNORMAL HIGH (ref 0.44–1.00)
GFR, Estimated: 24 mL/min — ABNORMAL LOW (ref >60)
Glucose, Bld: 138 mg/dL — ABNORMAL HIGH (ref 70–99)
Potassium: 3.8 mmol/L (ref 3.5–5.1)
Sodium: 140 mmol/L (ref 135–145)

## 2022-07-18 LAB — GLUCOSE, CAPILLARY
Glucose-Capillary: 128 mg/dL — ABNORMAL HIGH (ref 70–99)
Glucose-Capillary: 131 mg/dL — ABNORMAL HIGH (ref 70–99)
Glucose-Capillary: 139 mg/dL — ABNORMAL HIGH (ref 70–99)
Glucose-Capillary: 142 mg/dL — ABNORMAL HIGH (ref 70–99)
Glucose-Capillary: 143 mg/dL — ABNORMAL HIGH (ref 70–99)
Glucose-Capillary: 143 mg/dL — ABNORMAL HIGH (ref 70–99)

## 2022-07-18 NOTE — Progress Notes (Signed)
Mobility Specialist - Progress Note   07/18/22 0933  Mobility  Activity Ambulated with assistance in room;Transferred to/from [facility]  Level of Assistance Moderate assist, patient does 50-74%  Assistive Device Front wheel walker  Distance Ambulated (ft) 4 ft  Activity Response Tolerated well  Mobility Referral Yes  $Mobility charge 1 Mobility   Pt was received in bed and agreeable to mobility. Pt was ModA for bed mobility and standing. Upon standing pt experience urine incontinence and needing to use BSC. Pt was MaxA for peri care. Pt was returned to bed with all needs met and bed alarm on.   Franki Monte  Mobility Specialist Please contact via Solicitor or Rehab office at [phone]

## 2022-07-18 NOTE — Progress Notes (Signed)
PROGRESS NOTE    [name] [name]  [id number] DOB: 1941-03-21 DOA: 07/13/2022 PCP: Maryella Shivers, MD    Brief Narrative:   [name] [name] is a 82 y.o. female with past medical history significant for hypothyroidism, CKD stage IV, coronary artery disease, PVD, COPD, dysphagia/severe protein calorie malnutrition s/p PEG tube who presented to [facility] on 2/28 via EMS from home with generalized weakness and decreased intake via PEG tube with concern for malfunction.  Daughter reports that patient has been feeling "full" and subsequently has been taking the last tube feeds and water intake via PEG tube.  Patient unable to fully participate given poor historical recall.  In the ED, temperature 97.5 F, HR 102, RR 19, BP 90/76, SpO2 97% room air.  WBC 11.0, hemoglobin 13.1, platelets 233.  Sodium 157, potassium 4.2, chloride 121, CO2 22, glucose 120, BUN 104, creatinine 3.07.  Alysis with small leukocytes, negative nitrite, rare bacteria, 0-5 WBCs.  Abdominal x-ray/PEG tube evaluation with nonobstructive bowel gas pattern, contrast within the stomach.  TRH consulted for further evaluation management of acute on chronic renal failure, hypernatremia secondary to dehydration and generalized weakness.  Assessment & Plan:   Hypernatremia Patient presenting with a sodium 137 in the setting of poor intake via PEG tube over the preceding days of hospitalization.  Likely severe dehydration. -- Na 157>158>150>142>139>140 -- Restarted on tube feeds 2/29; tolerating and currently at goal -- BMP daily  Acute renal failure on CKD stage IV Baseline creatinine 1.9 to from September 2022.  Creatinine on admission elevated to 3.09.  Suspect etiology likely secondary to severe dehydration given decreased tube feeds and water per feeding tube outpatient per daughter. -- Cr 3.07>2.88>2.56>2.26>2.08>2.05 -- Restarted on tube feeds, now at goal  -- Avoid nephrotoxins, renal dose all medications -- BMP  daily  Acquired hypothyroidism TSH 0.100, free T4 1.60 -- Continue levothyroxine 50 mcg per tube daily  COPD -- Albuterol neb every 4 hours as needed shortness of breath/wheezing  Hyperlipidemia CAD/PVD --Atorvastatin 40 mg per tube daily  Dysphagia s/p PEG tube Severe protein calorie malnutrition Adult failure to thrive PEG tube study shows that PEG is in proper location and functioning normally.  Dietitian consulted; and restarted on tube feeds. -- Maintain head of bed greater than 30 degrees -- Aspiration precautions  Nutrition Status: Nutrition Problem: Severe Malnutrition Etiology: chronic illness Signs/Symptoms: severe fat depletion, severe muscle depletion Interventions: Tube feeding    Weakness/debility/deconditioning: --PT/OT recommend SNF placement, TOC consulted; for discharge once bed available   DVT prophylaxis: heparin injection 5,000 Units Start: 07/14/22 0600 SCDs Start: 07/14/22 0437    Code Status: DNR Family Communication: No family present at bedside this morning  Disposition Plan:  Level of care: Med-Surg Status is: Inpatient Remains inpatient appropriate because: Pending SNF placement, medically stable for discharge once bed available   Consultants:  None  Procedures:  None  Antimicrobials:  None   Subjective: Patient seen examined bedside, resting comfortably.  Lying in bed.  No family present.  No specific complaints this morning.  Continues to tolerate tube feeds which are now at goal.  No specific questions or concerns at this time.  . Denies headache, no chest pain, no shortness of breath, no abdominal pain, no fever, no nausea/vomiting/diarrhea.  No acute events overnight per nursing staff. Pending SNF placement, medically stable for discharge once bed available  Objective: Vitals:   07/17/22 2005 07/18/22 0421 07/18/22 0500 07/18/22 0818  BP: (!) 132/57 121/72  (!) 85/65  Pulse: 100  92  92  Resp: '17 17  18  '$ Temp: 98.7 F  (37.1 C) 98.3 F (36.8 C)  98.1 F (36.7 C)  TempSrc: Oral Oral  Oral  SpO2: 95% 96%  98%  Weight:   53.2 kg     Intake/Output Summary (Last 24 hours) at 07/18/2022 1015 Last data filed at 07/17/2022 2150 Gross per 24 hour  Intake 1100 ml  Output 850 ml  Net 250 ml   Filed Weights   07/17/22 0500 07/18/22 0500  Weight: 54.9 kg 53.2 kg    Examination:  Physical Exam: GEN: NAD, alert and oriented x 3, thin/cachectic in appearance HEENT: NCAT, PERRL, EOMI, sclera clear, dry mucous membranes PULM: Coarse breath sounds bilaterally, no wheezing, normal respiratory effort room air CV: RRR w/o M/G/R GI: abd soft, NTND, NABS, PEG tube noted in place with tube feeds infusing MSK: no peripheral edema, moves all extremity independently NEURO: No focal deficits PSYCH: Depressed mood, flat affect Integumentary: No visible rashes/lesions/wounds noted on exposed skin surfaces    Data Reviewed: I have personally reviewed following labs and imaging studies  CBC: Recent Labs  Lab 07/13/22 2015 07/14/22 0605 07/15/22 0807  WBC 11.0* 11.0* 11.3*  NEUTROABS 7.1 7.6  --   HGB 13.1 12.5 11.3*  HCT 42.1 40.9 36.1  MCV 91.5 90.5 90.0  PLT 233 249 [id number]   Basic Metabolic Panel: Recent Labs  Lab 07/14/22 0605 07/14/22 1132 07/14/22 1433 07/14/22 1814 07/15/22 0807 07/15/22 1807 07/16/22 1029 07/17/22 0813 07/18/22 0359  NA 158*  --  157*  --  150*  --  142 139 140  K 3.7  --   --   --  3.2*  --  4.6 3.7 3.8  CL 122*  --   --   --  117*  --  111 108 106  CO2 23  --   --   --  21*  --  19* 24 26  GLUCOSE 108*  --   --   --  154*  --  123* 153* 138*  BUN 97*  --   --   --  83*  --  65* 54* 48*  CREATININE 2.88*  --   --   --  2.56*  --  2.26* 2.08* 2.05*  CALCIUM 9.3  --   --   --  8.7*  --  8.4* 8.2* 8.5*  MG 3.2* 3.3*  --  3.2* 2.8* 2.7*  --   --   --   PHOS  --  4.9*  --  4.8* 3.5 2.8  --   --   --    GFR: CrCl cannot be calculated (Unknown ideal weight.). Liver Function  Tests: Recent Labs  Lab 07/14/22 0605  AST 55*  ALT 47*  ALKPHOS 53  BILITOT 0.7  PROT 7.5  ALBUMIN 3.2*   No results for input(s): "LIPASE", "AMYLASE" in the last 168 hours. No results for input(s): "AMMONIA" in the last 168 hours. Coagulation Profile: No results for input(s): "INR", "PROTIME" in the last 168 hours. Cardiac Enzymes: No results for input(s): "CKTOTAL", "CKMB", "CKMBINDEX", "TROPONINI" in the last 168 hours. BNP (last 3 results) No results for input(s): "PROBNP" in the last 8760 hours. HbA1C: No results for input(s): "HGBA1C" in the last 72 hours. CBG: Recent Labs  Lab 07/17/22 1613 07/17/22 2007 07/18/22 0015 07/18/22 0422 07/18/22 0814  GLUCAP 135* 135* 128* 143* 139*   Lipid Profile: No results for input(s): "CHOL", "HDL", "LDLCALC", "TRIG", "CHOLHDL", "LDLDIRECT"  in the last 72 hours. Thyroid Function Tests: No results for input(s): "TSH", "T4TOTAL", "FREET4", "T3FREE", "THYROIDAB" in the last 72 hours.  Anemia Panel: No results for input(s): "VITAMINB12", "FOLATE", "FERRITIN", "TIBC", "IRON", "RETICCTPCT" in the last 72 hours. Sepsis Labs: No results for input(s): "PROCALCITON", "LATICACIDVEN" in the last 168 hours.  Recent Results (from the past 240 hour(s))  Expectorated Sputum Assessment w Gram Stain, Rflx to Resp Cult     Status: None   Collection Time: 07/15/22 11:02 AM   Specimen: Expectorated Sputum  Result Value Ref Range Status   Specimen Description EXPECTORATED SPUTUM  Final   Special Requests NONE  Final   Sputum evaluation   Final    Sputum specimen not acceptable for testing.  Please recollect.    Willette Brace, RN 07/15/22 1602 A. LAFRANCE Performed at [facility], [place] [address] [place], [place] [address]    Report Status 07/15/2022 FINAL  Final         Radiology Studies: No results found.      Scheduled Meds:  heparin  5,000 Units Subcutaneous Q8H   levothyroxine  75 mcg Per Tube Daily   rosuvastatin  40  mg Per Tube Daily   thiamine  100 mg Per Tube Daily   Continuous Infusions:  feeding supplement (JEVITY 1.5 CAL/FIBER) 1,000 mL (07/17/22 2110)   free water       LOS: 4 days    Time spent: 50 minutes spent on chart review, discussion with nursing staff, consultants, updating family and interview/physical exam; more than 50% of that time was spent in counseling and/or coordination of care.    Soniya Ashraf J [place] DO [facility] Available via Epic secure chat 7am-7pm After these hours, please refer to coverage provider listed on [email or web address] 07/18/2022, 10:15 AM

## 2022-07-18 NOTE — Progress Notes (Signed)
Physical Therapy Treatment Patient Details Name: [name] [name] MRN: [id number] DOB: 11/04/1940 Today's Date: 07/18/2022   History of Present Illness 82yo F who presented to the ED via EMS due to weakness and refusing tube feeds via PEG. Admitted with hypernatremia, ARF. PMH COPD, depression, frailty syndrome, HLD, HTN, PEG placement, PVD, throat surgery    PT Comments    Patient tolerating more activity including walking with RW x 25 ft with min assist. Patient with incr WOB, however sats 96% on room air and HR 100. Patient requires incr time due to slow movements.    Recommendations for follow up therapy are one component of a multi-disciplinary discharge planning process, led by the attending physician.  Recommendations may be updated based on patient status, additional functional criteria and insurance authorization.  Follow Up Recommendations  Skilled nursing-short term rehab (<3 hours/day)     Assistance Recommended at Discharge Frequent or constant Supervision/Assistance  Patient can return home with the following A lot of help with bathing/dressing/bathroom;Direct supervision/assist for medications management;Direct supervision/assist for financial management;A lot of help with walking and/or transfers;Assist for transportation;Assistance with cooking/housework;Help with stairs or ramp for entrance   Equipment Recommendations  Hospital bed;BSC/3in1;Wheelchair (measurements PT);Wheelchair cushion (measurements PT)    Recommendations for Other Services       Precautions / Restrictions Precautions Precautions: Fall;Other (comment) Precaution Comments: PEG Restrictions Weight Bearing Restrictions: No     Mobility  Bed Mobility Overal bed mobility: Needs Assistance Bed Mobility: Supine to Sit, Sit to Supine     Supine to sit: HOB elevated, Min assist Sit to supine: Min guard   General bed mobility comments: increased time/effort; able to come to sit with legs over EOB but  required assist to scoot out to get feet on the floor; no physical assist to return to supine, but guarding as pt close to the edge    Transfers Overall transfer level: Needs assistance Equipment used: Rolling walker (2 wheels) Transfers: Sit to/from Stand Sit to Stand: Min assist           General transfer comment: vc for hand placement on both reps; boosting assist to initiate    Ambulation/Gait Ambulation/Gait assistance: Min assist Gait Distance (Feet): 25 Feet Assistive device: Rolling walker (2 wheels) Gait Pattern/deviations: Step-through pattern, Decreased stride length, Shuffle, Trunk flexed   Gait velocity interpretation: <1.8 ft/sec, indicate of risk for recurrent falls   General Gait Details: pt with slow, short steps with forward flexed posture--unable to correct any of these with cues and facilitation; assist to turn RW 180 turn to return to bed   Stairs             Wheelchair Mobility    Modified Rankin (Stroke Patients Only)       Balance Overall balance assessment: Needs assistance Sitting-balance support: Feet supported, Bilateral upper extremity supported Sitting balance-Leahy Scale: Fair     Standing balance support: Bilateral upper extremity supported, During functional activity Standing balance-Leahy Scale: Poor                              Cognition Arousal/Alertness: Awake/alert Behavior During Therapy: WFL for tasks assessed/performed                                   General Comments: follows commands with some delay        Exercises General Exercises -  Lower Extremity Ankle Circles/Pumps: AROM, Both, 10 reps Heel Slides: AROM, Strengthening, Both, 5 reps (resisted extension)    General Comments General comments (skin integrity, edema, etc.): Daughter at bedside.      Pertinent Vitals/Pain Pain Assessment Pain Assessment: No/denies pain Faces Pain Scale: No hurt    Home Living                           Prior Function            PT Goals (current goals can now be found in the care plan section) Acute Rehab PT Goals Patient Stated Goal: get stronger Time For Goal Achievement: 07/28/22 Potential to Achieve Goals: Fair Progress towards PT goals: Progressing toward goals    Frequency    Min 2X/week      PT Plan Current plan remains appropriate    Co-evaluation              AM-PAC PT "6 Clicks" Mobility   Outcome Measure  Help needed turning from your back to your side while in a flat bed without using bedrails?: A Little Help needed moving from lying on your back to sitting on the side of a flat bed without using bedrails?: A Little Help needed moving to and from a bed to a chair (including a wheelchair)?: A Little Help needed standing up from a chair using your arms (e.g., wheelchair or bedside chair)?: A Little Help needed to walk in hospital room?: A Little Help needed climbing 3-5 steps with a railing? : Total 6 Click Score: 16    End of Session Equipment Utilized During Treatment: Gait belt Activity Tolerance: Patient tolerated treatment well Patient left: in bed;with call bell/phone within reach;with bed alarm set;with family/visitor present Nurse Communication: Mobility status PT Visit Diagnosis: Muscle weakness (generalized) (M62.81);Unsteadiness on feet (R26.81);Difficulty in walking, not elsewhere classified (R26.2)     Time: [id number] PT Time Calculation (min) (ACUTE ONLY): 28 min  Charges:  $Gait Training: 8-22 mins $Therapeutic Exercise: 8-22 mins                      Arby Barrette, PT Acute Rehabilitation Services  Office [phone]    Rexanne Mano 07/18/2022, 2:08 PM

## 2022-07-18 NOTE — Care Management Important Message (Signed)
Important Message  Patient Details  Name: [name] [name] MRN: [id number] Date of Birth: 10-07-1940   Medicare Important Message Given:  Yes     [name] Montine Circle 07/18/2022, 2:41 PM

## 2022-07-18 NOTE — Progress Notes (Addendum)
Occupational Therapy Treatment Patient Details Name: [name] [name] MRN: [id number] DOB: [birth year] Today's Date: 07/18/2022   History of present illness 82yo F who presented to the ED via EMS due to weakness and refusing tube feeds via PEG. Admitted with hypernatremia, ARF. PMH COPD, depression, frailty syndrome, HLD, HTN, PEG placement, PVD, throat surgery   OT comments  Focused on gradual progression of activity tolerance and functional mobility. Pt able to walk to/from sink in room with RW at min guard though one seated rest break required in between bouts. Pt with baseline DOE d/t COPD. Pt politely declined to work on ADLs this session. Pt's daughter present; encouraged continued gradual endurance improvements, energy conservation strategies, and daily mobility attempts. Continue to rec SNF rehab w/ pt and daughter in agreement.   Recommendations for follow up therapy are one component of a multi-disciplinary discharge planning process, led by the attending physician.  Recommendations may be updated based on patient status, additional functional criteria and insurance authorization.    Follow Up Recommendations  Skilled nursing-short term rehab (<3 hours/day)     Assistance Recommended at Discharge Frequent or constant Supervision/Assistance  Patient can return home with the following  A little help with walking and/or transfers;A lot of help with bathing/dressing/bathroom   Equipment Recommendations  BSC/3in1;Hospital bed;Wheelchair cushion (measurements OT);Wheelchair (measurements OT) (if does not already have this DME)    Recommendations for Other Services      Precautions / Restrictions Precautions Precautions: Fall;Other (comment) Precaution Comments: PEG Restrictions Weight Bearing Restrictions: No       Mobility Bed Mobility Overal bed mobility: Needs Assistance Bed Mobility: Supine to Sit, Sit to Supine     Supine to sit: Min guard, HOB elevated Sit to supine:  Min guard   General bed mobility comments: increased time/effort    Transfers Overall transfer level: Needs assistance Equipment used: Rolling walker (2 wheels) Transfers: Sit to/from Stand Sit to Stand: Min assist, Min guard           General transfer comment: variable, initially heavy Min A to stand at bedside progressing to min guard when cued to push from armrests of chair     Balance Overall balance assessment: Needs assistance Sitting-balance support: Feet supported, Bilateral upper extremity supported Sitting balance-Leahy Scale: Fair     Standing balance support: Bilateral upper extremity supported, During functional activity Standing balance-Leahy Scale: Poor                             ADL either performed or assessed with clinical judgement   ADL Overall ADL's : Needs assistance/impaired Eating/Feeding: NPO                   Lower Body Dressing: Maximal assistance;Sitting/lateral leans;Sit to/from stand               Functional mobility during ADLs: Min guard;Rolling walker (2 wheels) General ADL Comments: Provided options of progressing BSC transfers, ADLs at sink or "just get up and moving to see how far we can go" with pt choosing later option. Pt able to progress mobility to/from sink using RW with one seated rest break, noted SOB d/t baseline COPD (per daughter). Emphasis on energy conservation and progression of abilities gradually    Extremity/Trunk Assessment Upper Extremity Assessment Upper Extremity Assessment: Generalized weakness   Lower Extremity Assessment Lower Extremity Assessment: Defer to PT evaluation        Vision   Vision Assessment?:  No apparent visual deficits   Perception     Praxis      Cognition Arousal/Alertness: Awake/alert Behavior During Therapy: WFL for tasks assessed/performed Overall Cognitive Status: No family/caregiver present to determine baseline cognitive functioning                                  General Comments: reports "i dont remember" to some PLOF questions but very pleasant, follows directions consistently        Exercises      Shoulder Instructions       General Comments Daughter at bedside, supportive and providing PLOF info. Noted IV  tubing loose and applied additional paper tape to secure    Pertinent Vitals/ Pain       Pain Assessment Pain Assessment: No/denies pain  Home Living                                          Prior Functioning/Environment              Frequency  Min 2X/week        Progress Toward Goals  OT Goals(current goals can now be found in the care plan section)  Progress towards OT goals: Progressing toward goals  Acute Rehab OT Goals Patient Stated Goal: improve strength OT Goal Formulation: With patient Time For Goal Achievement: 07/28/22 Potential to Achieve Goals: Fair ADL Goals Pt Will Transfer to Toilet: with min assist;stand pivot transfer;bedside commode Pt Will Perform Toileting - Clothing Manipulation and hygiene: with min assist;sit to/from stand;sitting/lateral leans Pt/caregiver will Perform Home Exercise Program: Increased strength;Both right and left upper extremity;With theraband;With Supervision;With written HEP provided Additional ADL Goal #1: Pt to verbalize at least 3 energy conservation strategies to implement during daily tasks Additional ADL Goal #2: Pt to increase activity tolerance > 5 min during ADLs/mobility with no more than one rest break  Plan Discharge plan remains appropriate    Co-evaluation                 AM-PAC OT "6 Clicks" Daily Activity     Outcome Measure   Help from another person eating meals?: Total (NPO) Help from another person taking care of personal grooming?: A Little Help from another person toileting, which includes using toliet, bedpan, or urinal?: Total Help from another person bathing (including washing, rinsing,  drying)?: A Lot Help from another person to put on and taking off regular upper body clothing?: A Little Help from another person to put on and taking off regular lower body clothing?: A Lot 6 Click Score: 12    End of Session Equipment Utilized During Treatment: Rolling walker (2 wheels);Gait belt  OT Visit Diagnosis: Other abnormalities of gait and mobility (R26.89);Muscle weakness (generalized) (M62.81)   Activity Tolerance Patient limited by fatigue   Patient Left in bed;with call bell/phone within reach;with bed alarm set;with family/visitor present   Nurse Communication          Time: [id number] OT Time Calculation (min): 22 min  Charges: OT General Charges $OT Visit: 1 Visit OT Treatments $Therapeutic Activity: 8-22 mins  Malachy Chamber, OTR/L Acute Rehab Services Office: [place] 07/18/2022, 1:49 PM

## 2022-07-18 NOTE — TOC Progression Note (Addendum)
Transition of Care [facility] - Progression Note    Patient Details  Name: [name] [name] MRN: [id number] Date of Birth: Oct 29, 1940  Transition of Care [facility]) CM/SW [place], [place] Phone Number: 07/18/2022, 4:15 PM  Clinical Narrative:     CSW Lvm for patients daughter [name]. CSW awaiting call back to discuss SNF bed offers. Patients pasrr number was approved [id number]. CSW started insurance authorization for patient [place] ID# [id number]. CSW will add patients SNF choice to patients insurance authorization once decided.CSW will continue to follow and assist with patients dc planning needs.   Expected Discharge Plan: [place] Barriers to Discharge: Continued Medical Work up  Expected Discharge Plan and Services   Discharge Planning Services: CM Consult Post Acute Care Choice: [place] Living arrangements for the past 2 months: Single Family Home                                       Social Determinants of Health (SDOH) Interventions SDOH Screenings   Food Insecurity: No Food Insecurity (07/14/2022)  Housing: Low Risk  (07/14/2022)  Transportation Needs: No Transportation Needs (07/14/2022)  Utilities: Not At Risk (07/14/2022)  Tobacco Use: Medium Risk (01/24/2021)    Readmission Risk Interventions    07/15/2022    3:12 PM  Readmission Risk Prevention Plan  Transportation Screening Complete  PCP or Specialist Appt within 5-7 Days Complete  Home Care Screening Complete  Medication Review (RN CM) Complete

## 2022-07-19 LAB — GLUCOSE, CAPILLARY
Glucose-Capillary: 119 mg/dL — ABNORMAL HIGH (ref 70–99)
Glucose-Capillary: 120 mg/dL — ABNORMAL HIGH (ref 70–99)
Glucose-Capillary: 125 mg/dL — ABNORMAL HIGH (ref 70–99)
Glucose-Capillary: 129 mg/dL — ABNORMAL HIGH (ref 70–99)
Glucose-Capillary: 131 mg/dL — ABNORMAL HIGH (ref 70–99)
Glucose-Capillary: 139 mg/dL — ABNORMAL HIGH (ref 70–99)
Glucose-Capillary: 145 mg/dL — ABNORMAL HIGH (ref 70–99)

## 2022-07-19 MED ORDER — JEVITY 1.5 CAL/FIBER PO LIQD
237.0000 mL | Freq: Every day | ORAL | Status: DC
  Administered 2022-07-19 – 2022-07-20 (×5): 237 mL
  Filled 2022-07-19 (×7): qty 237

## 2022-07-19 MED ORDER — FREE WATER
150.0000 mL | Freq: Every day | Status: DC
  Administered 2022-07-19 – 2022-07-20 (×5): 150 mL

## 2022-07-19 NOTE — Progress Notes (Signed)
Mobility Specialist - Progress Note   07/19/22 1003  Mobility  Activity Ambulated with assistance in room  Level of Assistance Moderate assist, patient does 50-74%  Assistive Device Front wheel walker  Distance Ambulated (ft) 20 ft  Activity Response Tolerated well  Mobility Referral Yes  $Mobility charge 1 Mobility   Pt was received in bed and agreeable to mobility. Pt was ModA throughout session with slow gait. Pt c/o dry mouth during session and requesting water. Pt was returned to bed with all needs met and bed alarm on.   Franki Monte  Mobility Specialist Please contact via Solicitor or Rehab office at [phone]

## 2022-07-19 NOTE — Progress Notes (Signed)
Nutrition Follow-up  DOCUMENTATION CODES:   Severe malnutrition in context of chronic illness  INTERVENTION:  - Modify to Bolus Feeds.  - Bolus 5 cans of Jevity 1.5 per day (1185 mL) - This will provide 1777 kcals, 75 gm, 900 mL free water.  - Please flush 75 mL before and after each carton. This will provide 1650 mL free water total.   NUTRITION DIAGNOSIS:   Severe Malnutrition related to chronic illness as evidenced by severe fat depletion, severe muscle depletion.  GOAL:   Provide needs based on ASPEN/SCCM guidelines  MONITOR:   TF tolerance  REASON FOR ASSESSMENT:   Consult Enteral/tube feeding initiation and management  ASSESSMENT:   82 y.o. female admits related to weakness. PMH includes: COPD, depression, exertional dyspnea, HLD, HTN, hypothyroidism, PEG. Pt is currently receiving medical management related to hypernatremia.  Meds reviewed: thiamine. Labs reviewed.   The pt is currently receiving tube feeds as ordered. BUN/creatinine elevated but improving. RD will transition pt over to bolus feeds in preparation for discharge. Will continue to monitor TF tolerance.   Diet Order:   Diet Order             Diet NPO time specified  Diet effective now                   EDUCATION NEEDS:   Not appropriate for education at this time  Skin:  Skin Assessment: Reviewed RN Assessment  Last BM:  3/4 - type 6  Height:   Ht Readings from Last 1 Encounters:  01/24/21 '5\' 4"'$  (1.626 m)    Weight:   Wt Readings from Last 1 Encounters:  07/19/22 54.1 kg    Ideal Body Weight:     BMI:  Body mass index is 20.47 kg/m.  Estimated Nutritional Needs:   Kcal:  [id number] kcals  Protein:  85-100 gm  Fluid:  >/= 1.7 L  [name] [name], RD, LDN, CNSC.

## 2022-07-19 NOTE — TOC Progression Note (Signed)
Transition of Care [facility]) - Progression Note    Patient Details  Name: [name] [name] MRN: [id number] Date of Birth: 09/27/1940  [facility] CM/SW Contact  Jinger Neighbors, [place] Phone Number: 07/19/2022, 2:55 PM  Clinical Narrative:     CSW consulted with pt's dtr this morning and she requested referral be sent to [place]; CSW sent referral. CSW later noted pt was accepted and reached out to [employer] to request auth. Pt is now approved for [place]. CSW contacted dtr to maker her aware. CSW reached out to MD to inquire about potential dc for tomorrow.   Expected Discharge Plan: [place] Barriers to Discharge: Continued Medical Work up  Expected Discharge Plan and Services   Discharge Planning Services: CM Consult Post Acute Care Choice: [place] Living arrangements for the past 2 months: Single Family Home                                       Social Determinants of Health (SDOH) Interventions SDOH Screenings   Food Insecurity: No Food Insecurity (07/14/2022)  Housing: Low Risk  (07/14/2022)  Transportation Needs: No Transportation Needs (07/14/2022)  Utilities: Not At Risk (07/14/2022)  Tobacco Use: Medium Risk (01/24/2021)    Readmission Risk Interventions    07/15/2022    3:12 PM  Readmission Risk Prevention Plan  Transportation Screening Complete  PCP or Specialist Appt within 5-7 Days Complete  Home Care Screening Complete  Medication Review (RN CM) Complete

## 2022-07-19 NOTE — Progress Notes (Signed)
PROGRESS NOTE    [name] [name]  [id number] DOB: 1940/08/27 DOA: 07/13/2022 PCP: Maryella Shivers, MD    Brief Narrative:   [name] [name] is a 82 y.o. female with past medical history significant for hypothyroidism, CKD stage IV, coronary artery disease, PVD, COPD, dysphagia/severe protein calorie malnutrition s/p PEG tube who presented to [facility] on 2/28 via EMS from home with generalized weakness and decreased intake via PEG tube with concern for malfunction.  Daughter reports that patient has been feeling "full" and subsequently has been taking the last tube feeds and water intake via PEG tube.  Patient unable to fully participate given poor historical recall.  In the ED, temperature 97.5 F, HR 102, RR 19, BP 90/76, SpO2 97% room air.  WBC 11.0, hemoglobin 13.1, platelets 233.  Sodium 157, potassium 4.2, chloride 121, CO2 22, glucose 120, BUN 104, creatinine 3.07.  Alysis with small leukocytes, negative nitrite, rare bacteria, 0-5 WBCs.  Abdominal x-ray/PEG tube evaluation with nonobstructive bowel gas pattern, contrast within the stomach.  TRH consulted for further evaluation management of acute on chronic renal failure, hypernatremia secondary to dehydration and generalized weakness.  Assessment & Plan:   Hypernatremia Patient presenting with a sodium 137 in the setting of poor intake via PEG tube over the preceding days of hospitalization.  Likely severe dehydration. -- Na 157>158>150>142>139>140 -- Restarted on tube feeds 2/29; tolerating and currently at goal -- BMP daily  Acute renal failure on CKD stage IV Baseline creatinine 1.9 to from September 2022.  Creatinine on admission elevated to 3.09.  Suspect etiology likely secondary to severe dehydration given decreased tube feeds and water per feeding tube outpatient per daughter. -- Cr 3.07>2.88>2.56>2.26>2.08>2.05 -- Restarted on tube feeds, now at goal  -- Avoid nephrotoxins, renal dose all medications -- BMP  daily  Acquired hypothyroidism TSH 0.100, free T4 1.60 -- Continue levothyroxine 50 mcg per tube daily  COPD -- Albuterol neb every 4 hours as needed shortness of breath/wheezing  Hyperlipidemia CAD/PVD --Atorvastatin 40 mg per tube daily  Dysphagia s/p PEG tube Severe protein calorie malnutrition Adult failure to thrive PEG tube study shows that PEG is in proper location and functioning normally.  Dietitian consulted; and restarted on tube feeds. -- Maintain head of bed greater than 30 degrees -- Aspiration precautions  Severe protein calorie malnutrition, POA Nutrition Status: Nutrition Problem: Severe Malnutrition Etiology: chronic illness Signs/Symptoms: severe fat depletion, severe muscle depletion Interventions: Tube feeding Dietitian following, on tube feeds   Weakness/debility/deconditioning: --PT/OT recommend SNF placement, TOC consulted; for discharge once bed available   DVT prophylaxis: heparin injection 5,000 Units Start: 07/14/22 0600 SCDs Start: 07/14/22 0437    Code Status: DNR Family Communication: No family present at bedside this morning  Disposition Plan:  Level of care: Med-Surg Status is: Inpatient Remains inpatient appropriate because: Pending SNF placement, medically stable for discharge once bed available   Consultants:  None  Procedures:  None  Antimicrobials:  None   Subjective: Patient seen examined bedside, resting comfortably.  Lying in bed.  No family present.  No specific complaints this morning.  Continues to tolerate tube feeds which are now at goal.  No specific questions or concerns at this time.  Discussed with RN this morning.  Denies headache, no chest pain, no shortness of breath, no abdominal pain, no fever, no nausea/vomiting/diarrhea.  No acute events overnight per nursing staff. Pending SNF placement, medically stable for discharge once bed available  Objective: Vitals:   07/18/22 1959 07/19/22 0446 07/19/22  0500  07/19/22 0726  BP: (!) 158/67 127/77  130/62  Pulse: 91 85  92  Resp: 16 16    Temp: 98.4 F (36.9 C) 98.5 F (36.9 C)  98.3 F (36.8 C)  TempSrc: Oral Oral  Oral  SpO2: 97% 96%  93%  Weight:   54.1 kg     Intake/Output Summary (Last 24 hours) at 07/19/2022 0912 Last data filed at 07/19/2022 0600 Gross per 24 hour  Intake 750 ml  Output 600 ml  Net 150 ml   Filed Weights   07/17/22 0500 07/18/22 0500 07/19/22 0500  Weight: 54.9 kg 53.2 kg 54.1 kg    Examination:  Physical Exam: GEN: NAD, alert and oriented x 3, thin/cachectic in appearance HEENT: NCAT, PERRL, EOMI, sclera clear, dry mucous membranes PULM: Coarse breath sounds bilaterally, no wheezing, normal respiratory effort room air CV: RRR w/o M/G/R GI: abd soft, NTND, NABS, PEG tube noted in place with tube feeds infusing MSK: no peripheral edema, moves all extremity independently NEURO: No focal deficits PSYCH: Depressed mood, flat affect Integumentary: No visible rashes/lesions/wounds noted on exposed skin surfaces    Data Reviewed: I have personally reviewed following labs and imaging studies  CBC: Recent Labs  Lab 07/13/22 2015 07/14/22 0605 07/15/22 0807  WBC 11.0* 11.0* 11.3*  NEUTROABS 7.1 7.6  --   HGB 13.1 12.5 11.3*  HCT 42.1 40.9 36.1  MCV 91.5 90.5 90.0  PLT 233 249 [id number]   Basic Metabolic Panel: Recent Labs  Lab 07/14/22 0605 07/14/22 1132 07/14/22 1433 07/14/22 1814 07/15/22 0807 07/15/22 1807 07/16/22 1029 07/17/22 0813 07/18/22 0359  NA 158*  --  157*  --  150*  --  142 139 140  K 3.7  --   --   --  3.2*  --  4.6 3.7 3.8  CL 122*  --   --   --  117*  --  111 108 106  CO2 23  --   --   --  21*  --  19* 24 26  GLUCOSE 108*  --   --   --  154*  --  123* 153* 138*  BUN 97*  --   --   --  83*  --  65* 54* 48*  CREATININE 2.88*  --   --   --  2.56*  --  2.26* 2.08* 2.05*  CALCIUM 9.3  --   --   --  8.7*  --  8.4* 8.2* 8.5*  MG 3.2* 3.3*  --  3.2* 2.8* 2.7*  --   --   --   PHOS  --   4.9*  --  4.8* 3.5 2.8  --   --   --    GFR: CrCl cannot be calculated (Unknown ideal weight.). Liver Function Tests: Recent Labs  Lab 07/14/22 0605  AST 55*  ALT 47*  ALKPHOS 53  BILITOT 0.7  PROT 7.5  ALBUMIN 3.2*   No results for input(s): "LIPASE", "AMYLASE" in the last 168 hours. No results for input(s): "AMMONIA" in the last 168 hours. Coagulation Profile: No results for input(s): "INR", "PROTIME" in the last 168 hours. Cardiac Enzymes: No results for input(s): "CKTOTAL", "CKMB", "CKMBINDEX", "TROPONINI" in the last 168 hours. BNP (last 3 results) No results for input(s): "PROBNP" in the last 8760 hours. HbA1C: No results for input(s): "HGBA1C" in the last 72 hours. CBG: Recent Labs  Lab 07/18/22 1602 07/18/22 2010 07/19/22 0001 07/19/22 0443 07/19/22 0729  GLUCAP 143* 131*  145* 139* 131*   Lipid Profile: No results for input(s): "CHOL", "HDL", "LDLCALC", "TRIG", "CHOLHDL", "LDLDIRECT" in the last 72 hours. Thyroid Function Tests: No results for input(s): "TSH", "T4TOTAL", "FREET4", "T3FREE", "THYROIDAB" in the last 72 hours.  Anemia Panel: No results for input(s): "VITAMINB12", "FOLATE", "FERRITIN", "TIBC", "IRON", "RETICCTPCT" in the last 72 hours. Sepsis Labs: No results for input(s): "PROCALCITON", "LATICACIDVEN" in the last 168 hours.  Recent Results (from the past 240 hour(s))  Expectorated Sputum Assessment w Gram Stain, Rflx to Resp Cult     Status: None   Collection Time: 07/15/22 11:02 AM   Specimen: Expectorated Sputum  Result Value Ref Range Status   Specimen Description EXPECTORATED SPUTUM  Final   Special Requests NONE  Final   Sputum evaluation   Final    Sputum specimen not acceptable for testing.  Please recollect.    Willette Brace, RN 07/15/22 1602 A. LAFRANCE Performed at [facility], [place] [address], [place], [place] [address]    Report Status 07/15/2022 FINAL  Final         Radiology Studies: No results  found.      Scheduled Meds:  heparin  5,000 Units Subcutaneous Q8H   levothyroxine  75 mcg Per Tube Daily   rosuvastatin  40 mg Per Tube Daily   thiamine  100 mg Per Tube Daily   Continuous Infusions:  feeding supplement (JEVITY 1.5 CAL/FIBER) 1,000 mL (07/18/22 1741)   free water       LOS: 5 days    Time spent: 46 minutes spent on chart review, discussion with nursing staff, consultants, updating family and interview/physical exam; more than 50% of that time was spent in counseling and/or coordination of care.    Tyreesha Maharaj J [place] DO [facility] Available via Epic secure chat 7am-7pm After these hours, please refer to coverage provider listed on [email or web address] 07/19/2022, 9:12 AM

## 2022-07-20 ENCOUNTER — Inpatient Hospital Stay (HOSPITAL_COMMUNITY): Payer: Medicare Other

## 2022-07-20 DIAGNOSIS — G8929 Other chronic pain: Secondary | ICD-10-CM | POA: Diagnosis not present

## 2022-07-20 DIAGNOSIS — E870 Hyperosmolality and hypernatremia: Secondary | ICD-10-CM | POA: Diagnosis not present

## 2022-07-20 DIAGNOSIS — J449 Chronic obstructive pulmonary disease, unspecified: Secondary | ICD-10-CM | POA: Diagnosis not present

## 2022-07-20 DIAGNOSIS — I700 Atherosclerosis of aorta: Secondary | ICD-10-CM | POA: Diagnosis not present

## 2022-07-20 DIAGNOSIS — Z743 Need for continuous supervision: Secondary | ICD-10-CM | POA: Diagnosis not present

## 2022-07-20 DIAGNOSIS — R0602 Shortness of breath: Secondary | ICD-10-CM | POA: Diagnosis not present

## 2022-07-20 DIAGNOSIS — R0689 Other abnormalities of breathing: Secondary | ICD-10-CM | POA: Diagnosis not present

## 2022-07-20 DIAGNOSIS — M6259 Muscle wasting and atrophy, not elsewhere classified, multiple sites: Secondary | ICD-10-CM | POA: Diagnosis not present

## 2022-07-20 DIAGNOSIS — E782 Mixed hyperlipidemia: Secondary | ICD-10-CM | POA: Diagnosis not present

## 2022-07-20 DIAGNOSIS — R5381 Other malaise: Secondary | ICD-10-CM | POA: Diagnosis not present

## 2022-07-20 DIAGNOSIS — Z7401 Bed confinement status: Secondary | ICD-10-CM | POA: Diagnosis not present

## 2022-07-20 DIAGNOSIS — I739 Peripheral vascular disease, unspecified: Secondary | ICD-10-CM | POA: Diagnosis not present

## 2022-07-20 DIAGNOSIS — Z7409 Other reduced mobility: Secondary | ICD-10-CM | POA: Diagnosis not present

## 2022-07-20 DIAGNOSIS — E43 Unspecified severe protein-calorie malnutrition: Secondary | ICD-10-CM | POA: Diagnosis not present

## 2022-07-20 DIAGNOSIS — R1312 Dysphagia, oropharyngeal phase: Secondary | ICD-10-CM | POA: Diagnosis not present

## 2022-07-20 DIAGNOSIS — Z931 Gastrostomy status: Secondary | ICD-10-CM | POA: Diagnosis not present

## 2022-07-20 DIAGNOSIS — I10 Essential (primary) hypertension: Secondary | ICD-10-CM | POA: Diagnosis not present

## 2022-07-20 DIAGNOSIS — R262 Difficulty in walking, not elsewhere classified: Secondary | ICD-10-CM | POA: Diagnosis not present

## 2022-07-20 DIAGNOSIS — R0989 Other specified symptoms and signs involving the circulatory and respiratory systems: Secondary | ICD-10-CM | POA: Diagnosis not present

## 2022-07-20 DIAGNOSIS — R059 Cough, unspecified: Secondary | ICD-10-CM | POA: Diagnosis not present

## 2022-07-20 DIAGNOSIS — E039 Hypothyroidism, unspecified: Secondary | ICD-10-CM | POA: Diagnosis not present

## 2022-07-20 DIAGNOSIS — N184 Chronic kidney disease, stage 4 (severe): Secondary | ICD-10-CM | POA: Diagnosis not present

## 2022-07-20 DIAGNOSIS — I779 Disorder of arteries and arterioles, unspecified: Secondary | ICD-10-CM | POA: Diagnosis not present

## 2022-07-20 LAB — BASIC METABOLIC PANEL
Anion gap: 12 (ref 5–15)
BUN: 42 mg/dL — ABNORMAL HIGH (ref 8–23)
CO2: 28 mmol/L (ref 22–32)
Calcium: 9.2 mg/dL (ref 8.9–10.3)
Chloride: 104 mmol/L (ref 98–111)
Creatinine, Ser: 1.8 mg/dL — ABNORMAL HIGH (ref 0.44–1.00)
GFR, Estimated: 28 mL/min — ABNORMAL LOW (ref >60)
Glucose, Bld: 152 mg/dL — ABNORMAL HIGH (ref 70–99)
Potassium: 4.1 mmol/L (ref 3.5–5.1)
Sodium: 144 mmol/L (ref 135–145)

## 2022-07-20 LAB — MAGNESIUM: Magnesium: 2.5 mg/dL — ABNORMAL HIGH (ref 1.7–2.4)

## 2022-07-20 LAB — GLUCOSE, CAPILLARY
Glucose-Capillary: 116 mg/dL — ABNORMAL HIGH (ref 70–99)
Glucose-Capillary: 169 mg/dL — ABNORMAL HIGH (ref 70–99)
Glucose-Capillary: 106 mg/dL — ABNORMAL HIGH (ref 70–99)

## 2022-07-20 LAB — PHOSPHORUS: Phosphorus: 3.6 mg/dL (ref 2.5–4.6)

## 2022-07-20 MED ORDER — ATORVASTATIN CALCIUM 40 MG PO TABS: 40.0000 mg | ORAL_TABLET | Freq: Every day | ORAL | 11 refills | Status: AC

## 2022-07-20 MED ORDER — VITAMIN B-1 100 MG PO TABS: 100.0000 mg | ORAL_TABLET | Freq: Every day | ORAL | Status: AC

## 2022-07-20 MED ORDER — JEVITY 1.5 CAL/FIBER PO LIQD: 237.0000 mL | Freq: Every day | ORAL | Status: AC

## 2022-07-20 MED ORDER — FREE WATER: 150.0000 mL | Freq: Every day | Status: AC

## 2022-07-20 NOTE — Discharge Summary (Signed)
Physician Discharge Summary  Kristinamarie Lab [id number] DOB: 02/20/1941 DOA: 07/13/2022  PCP: Maryella Shivers, MD  Admit date: 07/13/2022 Discharge date: 07/20/2022  Time spent: 40 minutes  Recommendations for Outpatient Follow-up:  Follow outpatient CBC/CMP  Repeat BMP in [place] few days to monitor sodium and creatinine - adjust free water as needed Repeat thyroid labs in [place] few weeks, adjust synthroid as needed Statin switched to lipitor based on his renal function (d/c'd crestor)  Discharge Diagnoses:  Principal Problem:   Hypernatremia Active Problems:   Acute renal failure superimposed on stage 4 chronic kidney disease (HCC)   HTN (hypertension)   Protein-calorie malnutrition, severe   PEG (percutaneous endoscopic gastrostomy) status ([place])   Chronic obstructive pulmonary disease ([place])   Acquired hypothyroidism   Discharge Condition: stable  Diet recommendation: NPO, tube feeds  Filed Weights   07/17/22 0500 07/18/22 0500 07/19/22 0500  Weight: 54.9 kg 53.2 kg 54.1 kg    History of present illness:   [name] [name] is [name] [name] 82 y.o. female with past medical history significant for hypothyroidism, CKD stage IV, coronary artery disease, PVD, COPD, dysphagia/severe protein calorie malnutrition s/p PEG tube who presented to [facility] on 2/28 via EMS from home with generalized weakness and decreased intake via PEG tube with concern for malfunction.  Daughter reports that patient has been feeling "full" and subsequently has been taking the last tube feeds and water intake via PEG tube.  Patient unable to fully participate given poor historical recall.   In the ED, temperature 97.5 F, HR 102, RR 19, BP 90/76, SpO2 97% room air.  WBC 11.0, hemoglobin 13.1, platelets 233.  Sodium 157, potassium 4.2, chloride 121, CO2 22, glucose 120, BUN 104, creatinine 3.07.  Alysis with small leukocytes, negative nitrite, rare bacteria, 0-5 WBCs.  Abdominal x-ray/PEG tube evaluation with  nonobstructive bowel gas pattern, contrast within the stomach.  TRH consulted for further evaluation management of acute on chronic renal failure, hypernatremia secondary to dehydration and generalized weakness.  She's improved with free water and resumption of tube feeds.  AKI has gradually improved.  See below for additional details   Hospital Course:  Assessment and Plan:  Hypernatremia Patient presenting with Vrishank Moster sodium 137 in the setting of poor intake via PEG tube over the preceding days of hospitalization.  Likely severe dehydration. -- Na 157>158>150>142>139>140 -> 144 --- repeat labs in [place] few days to ensure remains stable - adjust free water as needed -- Restarted on tube feeds 2/29; tolerating and currently at goal -- BMP daily   Acute renal failure on CKD stage IV Baseline creatinine 1.9 to from September 2022.  Creatinine on admission elevated to 3.09.  Suspect etiology likely secondary to severe dehydration given decreased tube feeds and water per feeding tube outpatient per daughter. -- Cr 3.07>2.88>2.56>2.26>2.08>2.05 -> 1.8 on day of discharge - follow outpatient -- Restarted on tube feeds, now at goal  -- Avoid nephrotoxins, renal dose all medications -- BMP daily   Acquired hypothyroidism TSH 0.100, free T4 1.60 -- Continue levothyroxine 50 mcg per tube daily - TSH is low, needs repeat labs in [place] few weeks once her acute illness resolves to determien whether any medication adjustment needed   COPD  Shortness of Breath -- Albuterol neb every 4 hours as needed shortness of breath/wheezing -- complained of SOB today, but on further discussion, sounds like related to exertion when getting up to bathroom - currently stable on RA -- CXR without acute abnormality - follow outpatient as  needed   Hyperlipidemia CAD/PVD --Atorvastatin 40 mg per tube daily   Dysphagia s/p PEG tube Severe protein calorie malnutrition Adult failure to thrive PEG tube study shows that PEG is  in proper location and functioning normally.  Dietitian consulted; and restarted on tube feeds. -- Maintain head of bed greater than 30 degrees -- Aspiration precautions   Severe protein calorie malnutrition, POA Nutrition Status: Nutrition Problem: Severe Malnutrition Etiology: chronic illness Signs/Symptoms: severe fat depletion, severe muscle depletion Interventions: Tube feeding Dietitian following, on tube feeds     Weakness/debility/deconditioning: --PT/OT recommend SNF placement, TOC consulted; for discharge once bed available     Procedures: none   Consultations: none  Discharge Exam: Vitals:   07/20/22 0741 07/20/22 1235  BP: (!) 150/73 (!) 141/74  Pulse: 90 88  Resp: 18   Temp: 97.7 F (36.5 C) 98.3 F (36.8 C)  SpO2: 96% 97%   C/o SOB, but just got done walking from bathroom Thinks that is the reason  General: No acute distress. Cardiovascular: Heart sounds show Amadi Frady regular rate, and rhythm. Lungs: Clear to auscultation bilaterally  Abdomen: Soft, nontender, nondistended Neurological: Alert and oriented 3. Moves all extremities 4 with equal strength. Cranial nerves II through XII grossly intact. Extremities: No clubbing or cyanosis. No edema.   Discharge Instructions   Discharge Instructions     Call MD for:  difficulty breathing, headache or visual disturbances   Complete by: As directed    Call MD for:  extreme fatigue   Complete by: As directed    Call MD for:  hives   Complete by: As directed    Call MD for:  persistant dizziness or light-headedness   Complete by: As directed    Call MD for:  persistant nausea and vomiting   Complete by: As directed    Call MD for:  redness, tenderness, or signs of infection (pain, swelling, redness, odor or green/yellow discharge around incision site)   Complete by: As directed    Call MD for:  severe uncontrolled pain   Complete by: As directed    Call MD for:  temperature >100.4   Complete by: As  directed    Diet - low sodium heart healthy   Complete by: As directed    Discharge instructions   Complete by: As directed    You were seen for hypernatremia (high sodium levels) generalized weakness and decreased intake via PEG.  Your sodium has improved with free water and resuming tube feeds.  You also had acute kidney injury.  This is improving.  You'll need repeat labs in [place] few days with your outpatient PCP.  Your thyroid function was abnormal.  You'll need repeat labs in [place] few weeks to determine if you need Floyed Masoud dose adjustment in your synthroid.  Will switch your statin to atorvastatin due to your renal function.  Follow this with your PCP outpatient.  You'll discharge to SNF.  Return for new, recurrent, or worsening symptoms.  Please ask your PCP to request records from this hospitalization so they know what was done and what the next steps will be.   Increase activity slowly   Complete by: As directed       Allergies as of 07/20/2022   No Known Allergies      Medication List     STOP taking these medications    OVER THE COUNTER MEDICATION   rosuvastatin 40 MG tablet Commonly known as: CRESTOR       TAKE these  medications    albuterol 108 (90 Base) MCG/ACT inhaler Commonly known as: VENTOLIN HFA Inhale 2 puffs into the lungs every 6 (six) hours as needed.   atorvastatin 40 MG tablet Commonly known as: Lipitor Place 1 tablet (40 mg total) into feeding tube daily.   feeding supplement (JEVITY 1.5 CAL/FIBER) Liqd Place 237 mLs into feeding tube 5 (five) times daily.   free water Soln Place 150 mLs into feeding tube 5 (five) times daily.   levothyroxine 50 MCG tablet Commonly known as: SYNTHROID Place 1 tablet (50 mcg total) into feeding tube daily. What changed: Another medication with the same name was removed. Continue taking this medication, and follow the directions you see here.   thiamine 100 MG tablet Commonly known as: Vitamin B-1 Place 1  tablet (100 mg total) into feeding tube daily. Start taking on: July 21, 2022   tiotropium 18 MCG inhalation capsule Commonly known as: SPIRIVA Place 18 mcg into inhaler and inhale daily.       No Known Allergies    The results of significant diagnostics from this hospitalization (including imaging, microbiology, ancillary and laboratory) are listed below for reference.    Significant Diagnostic Studies: DG CHEST PORT 1 VIEW  Result Date: 07/20/2022 CLINICAL DATA:  Shortness of breath and productive cough. EXAM: PORTABLE CHEST 1 VIEW COMPARISON:  07/15/2022.  Chest CT dated 12/15/2020. FINDINGS: Normal sized heart. Clear lungs with normal vascularity. Small right upper lobe calcified granuloma and biapical vascular calcifications without significant change. Aortic calcifications are also noted. Mild dextroconvex scoliosis. IMPRESSION: No acute abnormality. Electronically Signed   By: Claudie Revering M.D.   On: 07/20/2022 13:16   DG CHEST PORT 1 VIEW  Result Date: 07/15/2022 CLINICAL DATA:  Shortness of breath. EXAM: PORTABLE CHEST 1 VIEW COMPARISON:  January 24, 2021. FINDINGS: The heart size and mediastinal contours are within normal limits. Both lungs are clear. The visualized skeletal structures are unremarkable. IMPRESSION: No active disease. Aortic Atherosclerosis (ICD10-I70.0). Electronically Signed   By: Marijo Conception M.D.   On: 07/15/2022 11:56   DG ABDOMEN PEG TUBE LOCATION  Result Date: 07/14/2022 CLINICAL DATA:  Epigastric pain EXAM: ABDOMEN - 1 VIEW COMPARISON:  12/18/2020 FINDINGS: Nonobstructive bowel-gas pattern. No radiopaque calculi. Contrast within the stomach. IMPRESSION: Negative. Electronically Signed   By: Placido Sou M.D.   On: 07/14/2022 00:22    Microbiology: Recent Results (from the past 240 hour(s))  Expectorated Sputum Assessment w Gram Stain, Rflx to Resp Cult     Status: None   Collection Time: 07/15/22 11:02 AM   Specimen: Expectorated Sputum   Result Value Ref Range Status   Specimen Description EXPECTORATED SPUTUM  Final   Special Requests NONE  Final   Sputum evaluation   Final    Sputum specimen not acceptable for testing.  Please recollect.    Willette Brace, RN 07/15/22 1602 Elizabth Palka. [facility] Performed at [facility], [place] [address], [place], [place] [address]    Report Status 07/15/2022 FINAL  Final     Labs: Basic Metabolic Panel: Recent Labs  Lab 07/14/22 1132 07/14/22 1433 07/14/22 1814 07/15/22 0807 07/15/22 1807 07/16/22 1029 07/17/22 0813 07/18/22 0359 07/20/22 0818  NA  --    < >  --  150*  --  142 139 140 144  K  --   --   --  3.2*  --  4.6 3.7 3.8 4.1  CL  --   --   --  117*  --  111 108 106 104  CO2  --   --   --  21*  --  19* '24 26 28  '$ GLUCOSE  --   --   --  154*  --  123* 153* 138* 152*  BUN  --   --   --  83*  --  65* 54* 48* 42*  CREATININE  --   --   --  2.56*  --  2.26* 2.08* 2.05* 1.80*  CALCIUM  --   --   --  8.7*  --  8.4* 8.2* 8.5* 9.2  MG 3.3*  --  3.2* 2.8* 2.7*  --   --   --  2.5*  PHOS 4.9*  --  4.8* 3.5 2.8  --   --   --  3.6   < > = values in this interval not displayed.   Liver Function Tests: Recent Labs  Lab 07/14/22 0605  AST 55*  ALT 47*  ALKPHOS 53  BILITOT 0.7  PROT 7.5  ALBUMIN 3.2*   No results for input(s): "LIPASE", "AMYLASE" in the last 168 hours. No results for input(s): "AMMONIA" in the last 168 hours. CBC: Recent Labs  Lab 07/13/22 2015 07/14/22 0605 07/15/22 0807  WBC 11.0* 11.0* 11.3*  NEUTROABS 7.1 7.6  --   HGB 13.1 12.5 11.3*  HCT 42.1 40.9 36.1  MCV 91.5 90.5 90.0  PLT 233 249 202   Cardiac Enzymes: No results for input(s): "CKTOTAL", "CKMB", "CKMBINDEX", "TROPONINI" in the last 168 hours. BNP: BNP (last 3 results) No results for input(s): "BNP" in the last 8760 hours.  ProBNP (last 3 results) No results for input(s): "PROBNP" in the last 8760 hours.  CBG: Recent Labs  Lab 07/19/22 2056 07/19/22 2349 07/20/22 0416 07/20/22 0742  07/20/22 1224  GLUCAP 125* 120* 116* 169* 106*       Signed:  Fayrene Helper MD.  [facility] 07/20/2022, 2:36 PM

## 2022-07-20 NOTE — Progress Notes (Signed)
Mobility Specialist - Progress Note   07/20/22 0944  Mobility  Activity Ambulated with assistance in room  Level of Assistance Minimal assist, patient does 75% or more  Assistive Device Front wheel walker  Distance Ambulated (ft) 20 ft  Activity Response Tolerated fair  Mobility Referral Yes  $Mobility charge 1 Mobility   Pt was received in bed and agreeable to mobility. Pt c/o being tired and having difficulty coughing up secretions. Pt was MinA throughout the whole session. Pt was left in chair with all needs met and chair alarm on.   Franki Monte  Mobility Specialist Please contact via Solicitor or Rehab office at [phone]

## 2022-07-20 NOTE — Progress Notes (Signed)
Occupational Therapy Treatment Patient Details Name: Shareena Willenbrink MRN: [id number] DOB: 05/09/1941 Today's Date: 07/20/2022   History of present illness 82yo F who presented to the ED via EMS due to weakness and refusing tube feeds via PEG. Admitted with hypernatremia, ARF. PMH COPD, depression, frailty syndrome, HLD, HTN, PEG placement, PVD, throat surgery   OT comments  Patient continues to demonstrate good gains with OT treatment. Patient able to stand at sink for grooming task and perform functional transfers with min assist. Patient often requires increase time to recover after grooming and performing transfers. UE HEP performed without therapy bands due to patient fatigued quickly without resistance. Patient to continue to be followed by acute OT to address LB bathing and dressing, UE HEP with therapy bands, and energy conservation. Discharge recommendations continue to be appropriate at this time.     Recommendations for follow up therapy are one component of a multi-disciplinary discharge planning process, led by the attending physician.  Recommendations may be updated based on patient status, additional functional criteria and insurance authorization.    Follow Up Recommendations  Skilled nursing-short term rehab (<3 hours/day)     Assistance Recommended at Discharge Frequent or constant Supervision/Assistance  Patient can return home with the following  A little help with walking and/or transfers;A lot of help with bathing/dressing/bathroom   Equipment Recommendations  BSC/3in1;Hospital bed;Wheelchair (measurements OT);Wheelchair cushion (measurements OT) (if does not already have this DME)    Recommendations for Other Services      Precautions / Restrictions Precautions Precautions: Fall;Other (comment) Precaution Comments: PEG Restrictions Weight Bearing Restrictions: No       Mobility Bed Mobility Overal bed mobility: Needs Assistance Bed Mobility: Supine to Sit, Sit  to Supine     Supine to sit: HOB elevated, Min assist Sit to supine: Min guard   General bed mobility comments: min assist to scoot to EOB. Patient able to get to supine without physical assistance    Transfers Overall transfer level: Needs assistance Equipment used: Rolling walker (2 wheels) Transfers: Sit to/from Stand Sit to Stand: Min assist           General transfer comment: verbal cues for hand placement for sit to stands and before sitting     Balance Overall balance assessment: Needs assistance Sitting-balance support: Feet supported, Bilateral upper extremity supported Sitting balance-Leahy Scale: Fair     Standing balance support: Single extremity supported, Bilateral upper extremity supported, During functional activity Standing balance-Leahy Scale: Poor Standing balance comment: reliant on at least one extremity for support                           ADL either performed or assessed with clinical judgement   ADL Overall ADL's : Needs assistance/impaired     Grooming: Wash/dry hands;Wash/dry face;Min guard;Sitting Grooming Details (indicate cue type and reason): at sink             Lower Body Dressing: Supervision/safety;Sitting/lateral leans Lower Body Dressing Details (indicate cue type and reason): supervision to change socks Toilet Transfer: Minimal assistance;Ambulation;Rolling walker (2 wheels) Toilet Transfer Details (indicate cue type and reason): simulated           General ADL Comments: point to point transfers to simulate toilet transfers with patient ambulating 10 feet each attempt    Extremity/Trunk Assessment              Vision       Perception     Praxis  Cognition Arousal/Alertness: Awake/alert Behavior During Therapy: WFL for tasks assessed/performed Overall Cognitive Status: No family/caregiver present to determine baseline cognitive functioning                                  General Comments: increased time to follow commands        Exercises Exercises: General Upper Extremity General Exercises - Upper Extremity Shoulder Flexion: AROM, Both, 10 reps, Seated Shoulder ABduction: AROM, Both, 10 reps, Seated Elbow Flexion: AROM, Both, 15 reps, Seated    Shoulder Instructions       General Comments      Pertinent Vitals/ Pain       Pain Assessment Pain Assessment: No/denies pain  Home Living                                          Prior Functioning/Environment              Frequency  Min 2X/week        Progress Toward Goals  OT Goals(current goals can now be found in the care plan section)  Progress towards OT goals: Progressing toward goals  Acute Rehab OT Goals Patient Stated Goal: get better OT Goal Formulation: With patient Time For Goal Achievement: 07/28/22 Potential to Achieve Goals: Fair ADL Goals Pt Will Transfer to Toilet: with min assist;stand pivot transfer;bedside commode Pt Will Perform Toileting - Clothing Manipulation and hygiene: with min assist;sit to/from stand;sitting/lateral leans Pt/caregiver will Perform Home Exercise Program: Increased strength;Both right and left upper extremity;With theraband;With Supervision;With written HEP provided Additional ADL Goal #1: Pt to verbalize at least 3 energy conservation strategies to implement during daily tasks Additional ADL Goal #2: Pt to increase activity tolerance > 5 min during ADLs/mobility with no more than one rest break  Plan Discharge plan remains appropriate    Co-evaluation                 AM-PAC OT "6 Clicks" Daily Activity     Outcome Measure   Help from another person eating meals?: Total (NPO) Help from another person taking care of personal grooming?: A Little Help from another person toileting, which includes using toliet, bedpan, or urinal?: A Lot Help from another person bathing (including washing, rinsing, drying)?: A  Lot Help from another person to put on and taking off regular upper body clothing?: A Little Help from another person to put on and taking off regular lower body clothing?: A Lot 6 Click Score: 13    End of Session Equipment Utilized During Treatment: Rolling walker (2 wheels);Gait belt  OT Visit Diagnosis: Other abnormalities of gait and mobility (R26.89);Muscle weakness (generalized) (M62.81)   Activity Tolerance Patient tolerated treatment well   Patient Left in bed;with call bell/phone within reach;with bed alarm set   Nurse Communication Mobility status        Time: [id number] OT Time Calculation (min): 28 min  Charges: OT General Charges $OT Visit: 1 Visit OT Treatments $Self Care/Home Management : 8-22 mins $Therapeutic Exercise: 8-22 mins  Lodema Hong, [place]  Office [phone]   Trixie Dredge 07/20/2022, 12:55 PM

## 2022-07-20 NOTE — TOC Transition Note (Signed)
Transition of Care [facility]) - CM/SW Discharge Note   Patient Details  Name: [name] [name] MRN: [id number] Date of Birth: October 18, 1940  [facility] CM/SW Contact:  Jinger Neighbors, LCSW Phone Number: 07/20/2022, 2:50 PM   Clinical Narrative:     Pt going to [facility] via [place]  Call to report: [phone]  Room #: 207  Pts dtr has been notified  Final next level of care: Skilled Nursing Facility Barriers to Discharge: No Barriers Identified   Patient Goals and CMS Choice CMS Medicare.gov Compare Post Acute Care list provided to:: Patient Represenative (must comment) (pt's dtr) Choice offered to / list presented to : Adult Children  Discharge Placement                Patient chooses bed at:  ([facility]) Patient to be transferred to facility by: [place] Name of family member notified: [name]- pt's dtr Patient and family notified of of transfer: 07/20/22  Discharge Plan and Services Additional resources added to the After Visit Summary for     Discharge Planning Services: CM Consult Post Acute Care Choice: [place]                               Social Determinants of Health (SDOH) Interventions SDOH Screenings   Food Insecurity: No Food Insecurity (07/14/2022)  Housing: Low Risk  (07/14/2022)  Transportation Needs: No Transportation Needs (07/14/2022)  Utilities: Not At Risk (07/14/2022)  Tobacco Use: Medium Risk (01/24/2021)     Readmission Risk Interventions    07/15/2022    3:12 PM  Readmission Risk Prevention Plan  Transportation Screening Complete  PCP or Specialist Appt within 5-7 Days Complete  Home Care Screening Complete  Medication Review (RN CM) Complete

## 2022-07-21 DIAGNOSIS — E43 Unspecified severe protein-calorie malnutrition: Secondary | ICD-10-CM | POA: Diagnosis not present

## 2022-07-21 DIAGNOSIS — J449 Chronic obstructive pulmonary disease, unspecified: Secondary | ICD-10-CM | POA: Diagnosis not present

## 2022-07-21 DIAGNOSIS — R5381 Other malaise: Secondary | ICD-10-CM | POA: Diagnosis not present

## 2022-07-21 DIAGNOSIS — N184 Chronic kidney disease, stage 4 (severe): Secondary | ICD-10-CM | POA: Diagnosis not present

## 2022-07-21 DIAGNOSIS — R0989 Other specified symptoms and signs involving the circulatory and respiratory systems: Secondary | ICD-10-CM | POA: Diagnosis not present

## 2022-07-25 DIAGNOSIS — Z931 Gastrostomy status: Secondary | ICD-10-CM | POA: Diagnosis not present

## 2022-07-25 DIAGNOSIS — R5381 Other malaise: Secondary | ICD-10-CM | POA: Diagnosis not present

## 2022-07-25 DIAGNOSIS — E43 Unspecified severe protein-calorie malnutrition: Secondary | ICD-10-CM | POA: Diagnosis not present

## 2022-07-25 DIAGNOSIS — E039 Hypothyroidism, unspecified: Secondary | ICD-10-CM | POA: Diagnosis not present

## 2022-07-30 IMAGING — DX DG CHEST 1V PORT
1 series · 1 of 1 positions shown · non-contrast
Comparison: 12/16/2020

CLINICAL DATA: Sepsis, lethargy, fever, cough

EXAM:
PORTABLE CHEST 1 VIEW

[chest ap]
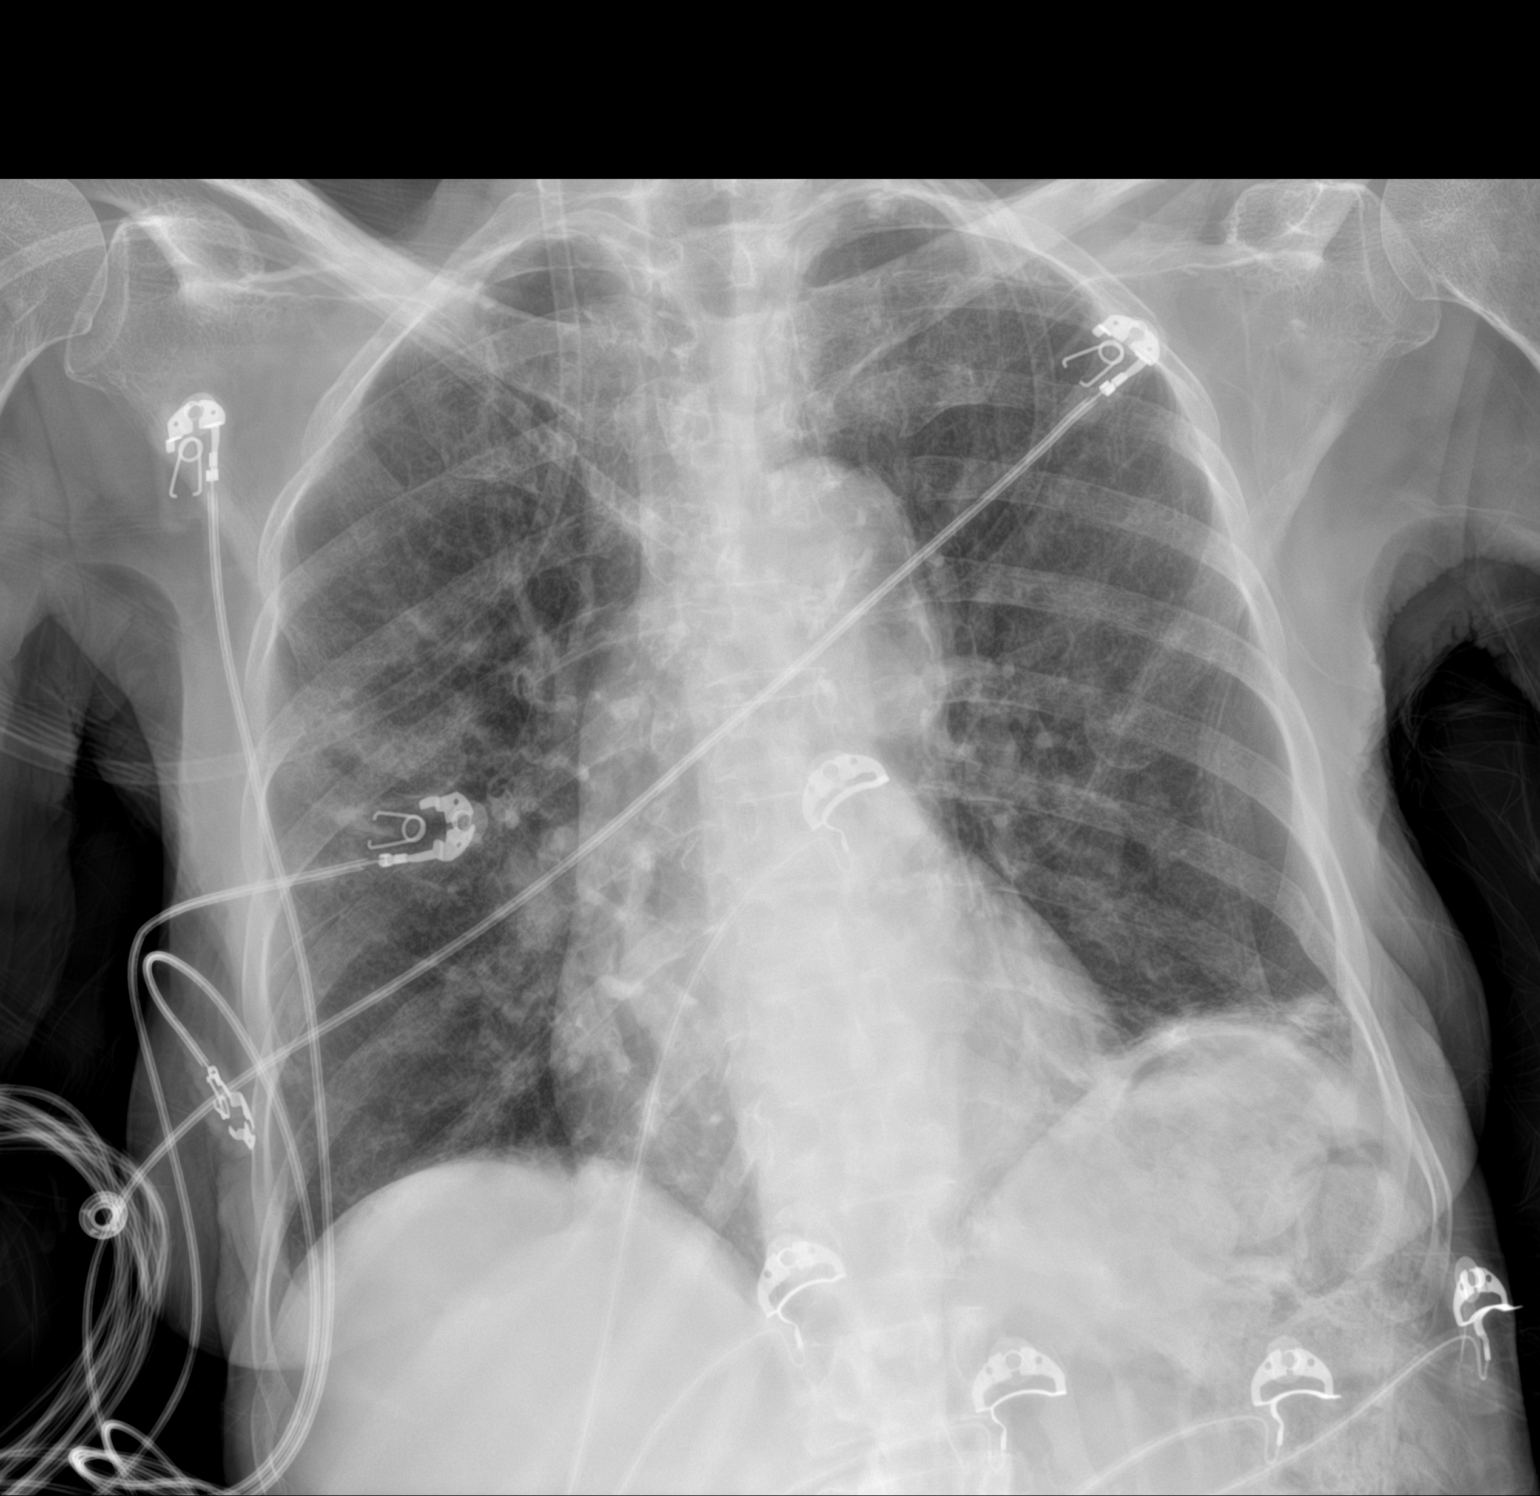

[1 of 1 positions shown; findings below may reference images not displayed]

FINDINGS: Single frontal view of the chest demonstrates an unremarkable
cardiac silhouette. There is increased interstitial prominence
throughout the lungs, with patchy ground-glass airspace disease in
the right perihilar region. Airspace disease versus atelectasis at
the left lung base. No effusion or pneumothorax. No acute bony
abnormalities.
IMPRESSION: 1. Diffuse interstitial opacities, with patchy ground-glass airspace
disease, most consistent with multifocal pneumonia.

## 2022-08-04 DIAGNOSIS — R5381 Other malaise: Secondary | ICD-10-CM | POA: Diagnosis not present

## 2022-08-04 DIAGNOSIS — E43 Unspecified severe protein-calorie malnutrition: Secondary | ICD-10-CM | POA: Diagnosis not present

## 2022-08-04 DIAGNOSIS — N184 Chronic kidney disease, stage 4 (severe): Secondary | ICD-10-CM | POA: Diagnosis not present

## 2022-08-04 DIAGNOSIS — J449 Chronic obstructive pulmonary disease, unspecified: Secondary | ICD-10-CM | POA: Diagnosis not present

## 2022-08-09 DIAGNOSIS — J449 Chronic obstructive pulmonary disease, unspecified: Secondary | ICD-10-CM | POA: Diagnosis not present

## 2022-08-09 DIAGNOSIS — M6259 Muscle wasting and atrophy, not elsewhere classified, multiple sites: Secondary | ICD-10-CM | POA: Diagnosis not present

## 2022-08-09 DIAGNOSIS — E782 Mixed hyperlipidemia: Secondary | ICD-10-CM | POA: Diagnosis not present

## 2022-08-09 DIAGNOSIS — N184 Chronic kidney disease, stage 4 (severe): Secondary | ICD-10-CM | POA: Diagnosis not present

## 2022-08-09 DIAGNOSIS — I779 Disorder of arteries and arterioles, unspecified: Secondary | ICD-10-CM | POA: Diagnosis not present

## 2022-08-09 DIAGNOSIS — E43 Unspecified severe protein-calorie malnutrition: Secondary | ICD-10-CM | POA: Diagnosis not present

## 2022-08-09 DIAGNOSIS — I129 Hypertensive chronic kidney disease with stage 1 through stage 4 chronic kidney disease, or unspecified chronic kidney disease: Secondary | ICD-10-CM | POA: Diagnosis not present

## 2022-08-09 DIAGNOSIS — R1312 Dysphagia, oropharyngeal phase: Secondary | ICD-10-CM | POA: Diagnosis not present

## 2022-08-09 DIAGNOSIS — I700 Atherosclerosis of aorta: Secondary | ICD-10-CM | POA: Diagnosis not present

## 2022-08-09 DIAGNOSIS — I739 Peripheral vascular disease, unspecified: Secondary | ICD-10-CM | POA: Diagnosis not present

## 2022-08-09 DIAGNOSIS — Z431 Encounter for attention to gastrostomy: Secondary | ICD-10-CM | POA: Diagnosis not present

## 2022-08-12 DIAGNOSIS — I129 Hypertensive chronic kidney disease with stage 1 through stage 4 chronic kidney disease, or unspecified chronic kidney disease: Secondary | ICD-10-CM | POA: Diagnosis not present

## 2022-08-12 DIAGNOSIS — E782 Mixed hyperlipidemia: Secondary | ICD-10-CM | POA: Diagnosis not present

## 2022-08-12 DIAGNOSIS — N184 Chronic kidney disease, stage 4 (severe): Secondary | ICD-10-CM | POA: Diagnosis not present

## 2022-08-12 DIAGNOSIS — I700 Atherosclerosis of aorta: Secondary | ICD-10-CM | POA: Diagnosis not present

## 2022-08-12 DIAGNOSIS — I779 Disorder of arteries and arterioles, unspecified: Secondary | ICD-10-CM | POA: Diagnosis not present

## 2022-08-12 DIAGNOSIS — M6259 Muscle wasting and atrophy, not elsewhere classified, multiple sites: Secondary | ICD-10-CM | POA: Diagnosis not present

## 2022-08-12 DIAGNOSIS — R1312 Dysphagia, oropharyngeal phase: Secondary | ICD-10-CM | POA: Diagnosis not present

## 2022-08-12 DIAGNOSIS — Z431 Encounter for attention to gastrostomy: Secondary | ICD-10-CM | POA: Diagnosis not present

## 2022-08-12 DIAGNOSIS — E43 Unspecified severe protein-calorie malnutrition: Secondary | ICD-10-CM | POA: Diagnosis not present

## 2022-08-12 DIAGNOSIS — J449 Chronic obstructive pulmonary disease, unspecified: Secondary | ICD-10-CM | POA: Diagnosis not present

## 2022-08-12 DIAGNOSIS — I739 Peripheral vascular disease, unspecified: Secondary | ICD-10-CM | POA: Diagnosis not present

## 2022-08-15 DIAGNOSIS — E43 Unspecified severe protein-calorie malnutrition: Secondary | ICD-10-CM | POA: Diagnosis not present

## 2022-08-15 DIAGNOSIS — E782 Mixed hyperlipidemia: Secondary | ICD-10-CM | POA: Diagnosis not present

## 2022-08-16 DIAGNOSIS — R1312 Dysphagia, oropharyngeal phase: Secondary | ICD-10-CM | POA: Diagnosis not present

## 2022-08-16 DIAGNOSIS — N184 Chronic kidney disease, stage 4 (severe): Secondary | ICD-10-CM | POA: Diagnosis not present

## 2022-08-16 DIAGNOSIS — I779 Disorder of arteries and arterioles, unspecified: Secondary | ICD-10-CM | POA: Diagnosis not present

## 2022-08-16 DIAGNOSIS — I129 Hypertensive chronic kidney disease with stage 1 through stage 4 chronic kidney disease, or unspecified chronic kidney disease: Secondary | ICD-10-CM | POA: Diagnosis not present

## 2022-08-16 DIAGNOSIS — I700 Atherosclerosis of aorta: Secondary | ICD-10-CM | POA: Diagnosis not present

## 2022-08-16 DIAGNOSIS — J449 Chronic obstructive pulmonary disease, unspecified: Secondary | ICD-10-CM | POA: Diagnosis not present

## 2022-08-16 DIAGNOSIS — M6259 Muscle wasting and atrophy, not elsewhere classified, multiple sites: Secondary | ICD-10-CM | POA: Diagnosis not present

## 2022-08-16 DIAGNOSIS — E782 Mixed hyperlipidemia: Secondary | ICD-10-CM | POA: Diagnosis not present

## 2022-08-16 DIAGNOSIS — Z431 Encounter for attention to gastrostomy: Secondary | ICD-10-CM | POA: Diagnosis not present

## 2022-08-16 DIAGNOSIS — I739 Peripheral vascular disease, unspecified: Secondary | ICD-10-CM | POA: Diagnosis not present

## 2022-08-16 DIAGNOSIS — E43 Unspecified severe protein-calorie malnutrition: Secondary | ICD-10-CM | POA: Diagnosis not present

## 2022-08-17 DIAGNOSIS — Z681 Body mass index (BMI) 19 or less, adult: Secondary | ICD-10-CM | POA: Diagnosis not present

## 2022-08-17 DIAGNOSIS — Z931 Gastrostomy status: Secondary | ICD-10-CM | POA: Diagnosis not present

## 2022-08-17 DIAGNOSIS — E039 Hypothyroidism, unspecified: Secondary | ICD-10-CM | POA: Diagnosis not present

## 2022-08-17 DIAGNOSIS — E782 Mixed hyperlipidemia: Secondary | ICD-10-CM | POA: Diagnosis not present

## 2022-08-17 DIAGNOSIS — Z7689 Persons encountering health services in other specified circumstances: Secondary | ICD-10-CM | POA: Diagnosis not present

## 2022-08-17 DIAGNOSIS — N184 Chronic kidney disease, stage 4 (severe): Secondary | ICD-10-CM | POA: Diagnosis not present

## 2022-08-18 DIAGNOSIS — I129 Hypertensive chronic kidney disease with stage 1 through stage 4 chronic kidney disease, or unspecified chronic kidney disease: Secondary | ICD-10-CM | POA: Diagnosis not present

## 2022-08-18 DIAGNOSIS — E782 Mixed hyperlipidemia: Secondary | ICD-10-CM | POA: Diagnosis not present

## 2022-08-18 DIAGNOSIS — N184 Chronic kidney disease, stage 4 (severe): Secondary | ICD-10-CM | POA: Diagnosis not present

## 2022-08-18 DIAGNOSIS — J449 Chronic obstructive pulmonary disease, unspecified: Secondary | ICD-10-CM | POA: Diagnosis not present

## 2022-08-18 DIAGNOSIS — I739 Peripheral vascular disease, unspecified: Secondary | ICD-10-CM | POA: Diagnosis not present

## 2022-08-18 DIAGNOSIS — R1312 Dysphagia, oropharyngeal phase: Secondary | ICD-10-CM | POA: Diagnosis not present

## 2022-08-18 DIAGNOSIS — I700 Atherosclerosis of aorta: Secondary | ICD-10-CM | POA: Diagnosis not present

## 2022-08-18 DIAGNOSIS — M6259 Muscle wasting and atrophy, not elsewhere classified, multiple sites: Secondary | ICD-10-CM | POA: Diagnosis not present

## 2022-08-18 DIAGNOSIS — Z431 Encounter for attention to gastrostomy: Secondary | ICD-10-CM | POA: Diagnosis not present

## 2022-08-18 DIAGNOSIS — I779 Disorder of arteries and arterioles, unspecified: Secondary | ICD-10-CM | POA: Diagnosis not present

## 2022-08-18 DIAGNOSIS — E43 Unspecified severe protein-calorie malnutrition: Secondary | ICD-10-CM | POA: Diagnosis not present

## 2022-08-19 DIAGNOSIS — M6259 Muscle wasting and atrophy, not elsewhere classified, multiple sites: Secondary | ICD-10-CM | POA: Diagnosis not present

## 2022-08-19 DIAGNOSIS — Z431 Encounter for attention to gastrostomy: Secondary | ICD-10-CM | POA: Diagnosis not present

## 2022-08-19 DIAGNOSIS — N184 Chronic kidney disease, stage 4 (severe): Secondary | ICD-10-CM | POA: Diagnosis not present

## 2022-08-19 DIAGNOSIS — I739 Peripheral vascular disease, unspecified: Secondary | ICD-10-CM | POA: Diagnosis not present

## 2022-08-19 DIAGNOSIS — I779 Disorder of arteries and arterioles, unspecified: Secondary | ICD-10-CM | POA: Diagnosis not present

## 2022-08-19 DIAGNOSIS — E782 Mixed hyperlipidemia: Secondary | ICD-10-CM | POA: Diagnosis not present

## 2022-08-19 DIAGNOSIS — J449 Chronic obstructive pulmonary disease, unspecified: Secondary | ICD-10-CM | POA: Diagnosis not present

## 2022-08-19 DIAGNOSIS — I700 Atherosclerosis of aorta: Secondary | ICD-10-CM | POA: Diagnosis not present

## 2022-08-19 DIAGNOSIS — E43 Unspecified severe protein-calorie malnutrition: Secondary | ICD-10-CM | POA: Diagnosis not present

## 2022-08-19 DIAGNOSIS — I129 Hypertensive chronic kidney disease with stage 1 through stage 4 chronic kidney disease, or unspecified chronic kidney disease: Secondary | ICD-10-CM | POA: Diagnosis not present

## 2022-08-19 DIAGNOSIS — R1312 Dysphagia, oropharyngeal phase: Secondary | ICD-10-CM | POA: Diagnosis not present

## 2022-08-20 DIAGNOSIS — J449 Chronic obstructive pulmonary disease, unspecified: Secondary | ICD-10-CM | POA: Diagnosis not present

## 2022-08-24 DIAGNOSIS — I700 Atherosclerosis of aorta: Secondary | ICD-10-CM | POA: Diagnosis not present

## 2022-08-24 DIAGNOSIS — J449 Chronic obstructive pulmonary disease, unspecified: Secondary | ICD-10-CM | POA: Diagnosis not present

## 2022-08-24 DIAGNOSIS — E782 Mixed hyperlipidemia: Secondary | ICD-10-CM | POA: Diagnosis not present

## 2022-08-24 DIAGNOSIS — M6259 Muscle wasting and atrophy, not elsewhere classified, multiple sites: Secondary | ICD-10-CM | POA: Diagnosis not present

## 2022-08-24 DIAGNOSIS — I779 Disorder of arteries and arterioles, unspecified: Secondary | ICD-10-CM | POA: Diagnosis not present

## 2022-08-24 DIAGNOSIS — N184 Chronic kidney disease, stage 4 (severe): Secondary | ICD-10-CM | POA: Diagnosis not present

## 2022-08-24 DIAGNOSIS — R1312 Dysphagia, oropharyngeal phase: Secondary | ICD-10-CM | POA: Diagnosis not present

## 2022-08-24 DIAGNOSIS — E43 Unspecified severe protein-calorie malnutrition: Secondary | ICD-10-CM | POA: Diagnosis not present

## 2022-08-24 DIAGNOSIS — I739 Peripheral vascular disease, unspecified: Secondary | ICD-10-CM | POA: Diagnosis not present

## 2022-08-24 DIAGNOSIS — I129 Hypertensive chronic kidney disease with stage 1 through stage 4 chronic kidney disease, or unspecified chronic kidney disease: Secondary | ICD-10-CM | POA: Diagnosis not present

## 2022-08-24 DIAGNOSIS — Z431 Encounter for attention to gastrostomy: Secondary | ICD-10-CM | POA: Diagnosis not present

## 2022-08-26 DIAGNOSIS — M6259 Muscle wasting and atrophy, not elsewhere classified, multiple sites: Secondary | ICD-10-CM | POA: Diagnosis not present

## 2022-08-26 DIAGNOSIS — E782 Mixed hyperlipidemia: Secondary | ICD-10-CM | POA: Diagnosis not present

## 2022-08-26 DIAGNOSIS — I700 Atherosclerosis of aorta: Secondary | ICD-10-CM | POA: Diagnosis not present

## 2022-08-26 DIAGNOSIS — N184 Chronic kidney disease, stage 4 (severe): Secondary | ICD-10-CM | POA: Diagnosis not present

## 2022-08-26 DIAGNOSIS — R1312 Dysphagia, oropharyngeal phase: Secondary | ICD-10-CM | POA: Diagnosis not present

## 2022-08-26 DIAGNOSIS — I779 Disorder of arteries and arterioles, unspecified: Secondary | ICD-10-CM | POA: Diagnosis not present

## 2022-08-26 DIAGNOSIS — J449 Chronic obstructive pulmonary disease, unspecified: Secondary | ICD-10-CM | POA: Diagnosis not present

## 2022-08-26 DIAGNOSIS — Z431 Encounter for attention to gastrostomy: Secondary | ICD-10-CM | POA: Diagnosis not present

## 2022-08-26 DIAGNOSIS — E43 Unspecified severe protein-calorie malnutrition: Secondary | ICD-10-CM | POA: Diagnosis not present

## 2022-08-26 DIAGNOSIS — I739 Peripheral vascular disease, unspecified: Secondary | ICD-10-CM | POA: Diagnosis not present

## 2022-08-26 DIAGNOSIS — I129 Hypertensive chronic kidney disease with stage 1 through stage 4 chronic kidney disease, or unspecified chronic kidney disease: Secondary | ICD-10-CM | POA: Diagnosis not present

## 2022-08-31 DIAGNOSIS — I700 Atherosclerosis of aorta: Secondary | ICD-10-CM | POA: Diagnosis not present

## 2022-08-31 DIAGNOSIS — I739 Peripheral vascular disease, unspecified: Secondary | ICD-10-CM | POA: Diagnosis not present

## 2022-08-31 DIAGNOSIS — I129 Hypertensive chronic kidney disease with stage 1 through stage 4 chronic kidney disease, or unspecified chronic kidney disease: Secondary | ICD-10-CM | POA: Diagnosis not present

## 2022-08-31 DIAGNOSIS — J449 Chronic obstructive pulmonary disease, unspecified: Secondary | ICD-10-CM | POA: Diagnosis not present

## 2022-08-31 DIAGNOSIS — I779 Disorder of arteries and arterioles, unspecified: Secondary | ICD-10-CM | POA: Diagnosis not present

## 2022-08-31 DIAGNOSIS — E782 Mixed hyperlipidemia: Secondary | ICD-10-CM | POA: Diagnosis not present

## 2022-08-31 DIAGNOSIS — N184 Chronic kidney disease, stage 4 (severe): Secondary | ICD-10-CM | POA: Diagnosis not present

## 2022-08-31 DIAGNOSIS — R1312 Dysphagia, oropharyngeal phase: Secondary | ICD-10-CM | POA: Diagnosis not present

## 2022-08-31 DIAGNOSIS — M6259 Muscle wasting and atrophy, not elsewhere classified, multiple sites: Secondary | ICD-10-CM | POA: Diagnosis not present

## 2022-08-31 DIAGNOSIS — E43 Unspecified severe protein-calorie malnutrition: Secondary | ICD-10-CM | POA: Diagnosis not present

## 2022-08-31 DIAGNOSIS — Z431 Encounter for attention to gastrostomy: Secondary | ICD-10-CM | POA: Diagnosis not present

## 2022-09-02 DIAGNOSIS — Z431 Encounter for attention to gastrostomy: Secondary | ICD-10-CM | POA: Diagnosis not present

## 2022-09-02 DIAGNOSIS — E43 Unspecified severe protein-calorie malnutrition: Secondary | ICD-10-CM | POA: Diagnosis not present

## 2022-09-02 DIAGNOSIS — E782 Mixed hyperlipidemia: Secondary | ICD-10-CM | POA: Diagnosis not present

## 2022-09-02 DIAGNOSIS — I779 Disorder of arteries and arterioles, unspecified: Secondary | ICD-10-CM | POA: Diagnosis not present

## 2022-09-02 DIAGNOSIS — Z8521 Personal history of malignant neoplasm of larynx: Secondary | ICD-10-CM | POA: Diagnosis not present

## 2022-09-02 DIAGNOSIS — I700 Atherosclerosis of aorta: Secondary | ICD-10-CM | POA: Diagnosis not present

## 2022-09-02 DIAGNOSIS — R1312 Dysphagia, oropharyngeal phase: Secondary | ICD-10-CM | POA: Diagnosis not present

## 2022-09-02 DIAGNOSIS — J449 Chronic obstructive pulmonary disease, unspecified: Secondary | ICD-10-CM | POA: Diagnosis not present

## 2022-09-02 DIAGNOSIS — I739 Peripheral vascular disease, unspecified: Secondary | ICD-10-CM | POA: Diagnosis not present

## 2022-09-02 DIAGNOSIS — N184 Chronic kidney disease, stage 4 (severe): Secondary | ICD-10-CM | POA: Diagnosis not present

## 2022-09-02 DIAGNOSIS — M6259 Muscle wasting and atrophy, not elsewhere classified, multiple sites: Secondary | ICD-10-CM | POA: Diagnosis not present

## 2022-09-02 DIAGNOSIS — I129 Hypertensive chronic kidney disease with stage 1 through stage 4 chronic kidney disease, or unspecified chronic kidney disease: Secondary | ICD-10-CM | POA: Diagnosis not present

## 2022-09-06 DIAGNOSIS — I739 Peripheral vascular disease, unspecified: Secondary | ICD-10-CM | POA: Diagnosis not present

## 2022-09-06 DIAGNOSIS — E782 Mixed hyperlipidemia: Secondary | ICD-10-CM | POA: Diagnosis not present

## 2022-09-06 DIAGNOSIS — Z431 Encounter for attention to gastrostomy: Secondary | ICD-10-CM | POA: Diagnosis not present

## 2022-09-06 DIAGNOSIS — I129 Hypertensive chronic kidney disease with stage 1 through stage 4 chronic kidney disease, or unspecified chronic kidney disease: Secondary | ICD-10-CM | POA: Diagnosis not present

## 2022-09-06 DIAGNOSIS — E43 Unspecified severe protein-calorie malnutrition: Secondary | ICD-10-CM | POA: Diagnosis not present

## 2022-09-06 DIAGNOSIS — M6259 Muscle wasting and atrophy, not elsewhere classified, multiple sites: Secondary | ICD-10-CM | POA: Diagnosis not present

## 2022-09-06 DIAGNOSIS — J449 Chronic obstructive pulmonary disease, unspecified: Secondary | ICD-10-CM | POA: Diagnosis not present

## 2022-09-06 DIAGNOSIS — R1312 Dysphagia, oropharyngeal phase: Secondary | ICD-10-CM | POA: Diagnosis not present

## 2022-09-06 DIAGNOSIS — N184 Chronic kidney disease, stage 4 (severe): Secondary | ICD-10-CM | POA: Diagnosis not present

## 2022-09-06 DIAGNOSIS — I779 Disorder of arteries and arterioles, unspecified: Secondary | ICD-10-CM | POA: Diagnosis not present

## 2022-09-06 DIAGNOSIS — I700 Atherosclerosis of aorta: Secondary | ICD-10-CM | POA: Diagnosis not present

## 2022-09-07 DIAGNOSIS — I779 Disorder of arteries and arterioles, unspecified: Secondary | ICD-10-CM | POA: Diagnosis not present

## 2022-09-07 DIAGNOSIS — N184 Chronic kidney disease, stage 4 (severe): Secondary | ICD-10-CM | POA: Diagnosis not present

## 2022-09-07 DIAGNOSIS — E782 Mixed hyperlipidemia: Secondary | ICD-10-CM | POA: Diagnosis not present

## 2022-09-07 DIAGNOSIS — J449 Chronic obstructive pulmonary disease, unspecified: Secondary | ICD-10-CM | POA: Diagnosis not present

## 2022-09-07 DIAGNOSIS — E43 Unspecified severe protein-calorie malnutrition: Secondary | ICD-10-CM | POA: Diagnosis not present

## 2022-09-07 DIAGNOSIS — R1312 Dysphagia, oropharyngeal phase: Secondary | ICD-10-CM | POA: Diagnosis not present

## 2022-09-07 DIAGNOSIS — I700 Atherosclerosis of aorta: Secondary | ICD-10-CM | POA: Diagnosis not present

## 2022-09-07 DIAGNOSIS — I739 Peripheral vascular disease, unspecified: Secondary | ICD-10-CM | POA: Diagnosis not present

## 2022-09-07 DIAGNOSIS — M6259 Muscle wasting and atrophy, not elsewhere classified, multiple sites: Secondary | ICD-10-CM | POA: Diagnosis not present

## 2022-09-07 DIAGNOSIS — Z431 Encounter for attention to gastrostomy: Secondary | ICD-10-CM | POA: Diagnosis not present

## 2022-09-07 DIAGNOSIS — I129 Hypertensive chronic kidney disease with stage 1 through stage 4 chronic kidney disease, or unspecified chronic kidney disease: Secondary | ICD-10-CM | POA: Diagnosis not present

## 2022-09-14 DIAGNOSIS — I129 Hypertensive chronic kidney disease with stage 1 through stage 4 chronic kidney disease, or unspecified chronic kidney disease: Secondary | ICD-10-CM | POA: Diagnosis not present

## 2022-09-14 DIAGNOSIS — I779 Disorder of arteries and arterioles, unspecified: Secondary | ICD-10-CM | POA: Diagnosis not present

## 2022-09-14 DIAGNOSIS — I739 Peripheral vascular disease, unspecified: Secondary | ICD-10-CM | POA: Diagnosis not present

## 2022-09-14 DIAGNOSIS — E43 Unspecified severe protein-calorie malnutrition: Secondary | ICD-10-CM | POA: Diagnosis not present

## 2022-09-14 DIAGNOSIS — M6259 Muscle wasting and atrophy, not elsewhere classified, multiple sites: Secondary | ICD-10-CM | POA: Diagnosis not present

## 2022-09-14 DIAGNOSIS — R1312 Dysphagia, oropharyngeal phase: Secondary | ICD-10-CM | POA: Diagnosis not present

## 2022-09-14 DIAGNOSIS — N184 Chronic kidney disease, stage 4 (severe): Secondary | ICD-10-CM | POA: Diagnosis not present

## 2022-09-14 DIAGNOSIS — E039 Hypothyroidism, unspecified: Secondary | ICD-10-CM | POA: Diagnosis not present

## 2022-09-14 DIAGNOSIS — I700 Atherosclerosis of aorta: Secondary | ICD-10-CM | POA: Diagnosis not present

## 2022-09-14 DIAGNOSIS — J449 Chronic obstructive pulmonary disease, unspecified: Secondary | ICD-10-CM | POA: Diagnosis not present

## 2022-09-14 DIAGNOSIS — Z431 Encounter for attention to gastrostomy: Secondary | ICD-10-CM | POA: Diagnosis not present

## 2022-09-14 DIAGNOSIS — E782 Mixed hyperlipidemia: Secondary | ICD-10-CM | POA: Diagnosis not present

## 2022-09-19 DIAGNOSIS — J449 Chronic obstructive pulmonary disease, unspecified: Secondary | ICD-10-CM | POA: Diagnosis not present

## 2022-09-20 DIAGNOSIS — R1312 Dysphagia, oropharyngeal phase: Secondary | ICD-10-CM | POA: Diagnosis not present

## 2022-09-20 DIAGNOSIS — E43 Unspecified severe protein-calorie malnutrition: Secondary | ICD-10-CM | POA: Diagnosis not present

## 2022-09-20 DIAGNOSIS — I739 Peripheral vascular disease, unspecified: Secondary | ICD-10-CM | POA: Diagnosis not present

## 2022-09-20 DIAGNOSIS — J449 Chronic obstructive pulmonary disease, unspecified: Secondary | ICD-10-CM | POA: Diagnosis not present

## 2022-09-20 DIAGNOSIS — N184 Chronic kidney disease, stage 4 (severe): Secondary | ICD-10-CM | POA: Diagnosis not present

## 2022-09-20 DIAGNOSIS — I779 Disorder of arteries and arterioles, unspecified: Secondary | ICD-10-CM | POA: Diagnosis not present

## 2022-09-20 DIAGNOSIS — I700 Atherosclerosis of aorta: Secondary | ICD-10-CM | POA: Diagnosis not present

## 2022-09-20 DIAGNOSIS — M6259 Muscle wasting and atrophy, not elsewhere classified, multiple sites: Secondary | ICD-10-CM | POA: Diagnosis not present

## 2022-09-20 DIAGNOSIS — I129 Hypertensive chronic kidney disease with stage 1 through stage 4 chronic kidney disease, or unspecified chronic kidney disease: Secondary | ICD-10-CM | POA: Diagnosis not present

## 2022-09-20 DIAGNOSIS — Z431 Encounter for attention to gastrostomy: Secondary | ICD-10-CM | POA: Diagnosis not present

## 2022-09-20 DIAGNOSIS — E782 Mixed hyperlipidemia: Secondary | ICD-10-CM | POA: Diagnosis not present

## 2022-09-28 DIAGNOSIS — Z431 Encounter for attention to gastrostomy: Secondary | ICD-10-CM | POA: Diagnosis not present

## 2022-09-28 DIAGNOSIS — I739 Peripheral vascular disease, unspecified: Secondary | ICD-10-CM | POA: Diagnosis not present

## 2022-09-28 DIAGNOSIS — J449 Chronic obstructive pulmonary disease, unspecified: Secondary | ICD-10-CM | POA: Diagnosis not present

## 2022-09-28 DIAGNOSIS — N184 Chronic kidney disease, stage 4 (severe): Secondary | ICD-10-CM | POA: Diagnosis not present

## 2022-09-28 DIAGNOSIS — M6259 Muscle wasting and atrophy, not elsewhere classified, multiple sites: Secondary | ICD-10-CM | POA: Diagnosis not present

## 2022-09-28 DIAGNOSIS — R1312 Dysphagia, oropharyngeal phase: Secondary | ICD-10-CM | POA: Diagnosis not present

## 2022-09-28 DIAGNOSIS — E43 Unspecified severe protein-calorie malnutrition: Secondary | ICD-10-CM | POA: Diagnosis not present

## 2022-09-28 DIAGNOSIS — I779 Disorder of arteries and arterioles, unspecified: Secondary | ICD-10-CM | POA: Diagnosis not present

## 2022-09-28 DIAGNOSIS — I129 Hypertensive chronic kidney disease with stage 1 through stage 4 chronic kidney disease, or unspecified chronic kidney disease: Secondary | ICD-10-CM | POA: Diagnosis not present

## 2022-09-28 DIAGNOSIS — E782 Mixed hyperlipidemia: Secondary | ICD-10-CM | POA: Diagnosis not present

## 2022-09-28 DIAGNOSIS — I700 Atherosclerosis of aorta: Secondary | ICD-10-CM | POA: Diagnosis not present

## 2022-09-30 DIAGNOSIS — R1312 Dysphagia, oropharyngeal phase: Secondary | ICD-10-CM | POA: Diagnosis not present

## 2022-09-30 DIAGNOSIS — J449 Chronic obstructive pulmonary disease, unspecified: Secondary | ICD-10-CM | POA: Diagnosis not present

## 2022-09-30 DIAGNOSIS — M6259 Muscle wasting and atrophy, not elsewhere classified, multiple sites: Secondary | ICD-10-CM | POA: Diagnosis not present

## 2022-09-30 DIAGNOSIS — E43 Unspecified severe protein-calorie malnutrition: Secondary | ICD-10-CM | POA: Diagnosis not present

## 2022-09-30 DIAGNOSIS — I129 Hypertensive chronic kidney disease with stage 1 through stage 4 chronic kidney disease, or unspecified chronic kidney disease: Secondary | ICD-10-CM | POA: Diagnosis not present

## 2022-09-30 DIAGNOSIS — E782 Mixed hyperlipidemia: Secondary | ICD-10-CM | POA: Diagnosis not present

## 2022-09-30 DIAGNOSIS — I700 Atherosclerosis of aorta: Secondary | ICD-10-CM | POA: Diagnosis not present

## 2022-09-30 DIAGNOSIS — I779 Disorder of arteries and arterioles, unspecified: Secondary | ICD-10-CM | POA: Diagnosis not present

## 2022-09-30 DIAGNOSIS — Z431 Encounter for attention to gastrostomy: Secondary | ICD-10-CM | POA: Diagnosis not present

## 2022-09-30 DIAGNOSIS — N184 Chronic kidney disease, stage 4 (severe): Secondary | ICD-10-CM | POA: Diagnosis not present

## 2022-09-30 DIAGNOSIS — I739 Peripheral vascular disease, unspecified: Secondary | ICD-10-CM | POA: Diagnosis not present

## 2022-10-03 DIAGNOSIS — E43 Unspecified severe protein-calorie malnutrition: Secondary | ICD-10-CM | POA: Diagnosis not present

## 2022-10-03 DIAGNOSIS — I129 Hypertensive chronic kidney disease with stage 1 through stage 4 chronic kidney disease, or unspecified chronic kidney disease: Secondary | ICD-10-CM | POA: Diagnosis not present

## 2022-10-03 DIAGNOSIS — I700 Atherosclerosis of aorta: Secondary | ICD-10-CM | POA: Diagnosis not present

## 2022-10-03 DIAGNOSIS — Z431 Encounter for attention to gastrostomy: Secondary | ICD-10-CM | POA: Diagnosis not present

## 2022-10-03 DIAGNOSIS — J449 Chronic obstructive pulmonary disease, unspecified: Secondary | ICD-10-CM | POA: Diagnosis not present

## 2022-10-03 DIAGNOSIS — I779 Disorder of arteries and arterioles, unspecified: Secondary | ICD-10-CM | POA: Diagnosis not present

## 2022-10-03 DIAGNOSIS — Z8521 Personal history of malignant neoplasm of larynx: Secondary | ICD-10-CM | POA: Diagnosis not present

## 2022-10-03 DIAGNOSIS — N184 Chronic kidney disease, stage 4 (severe): Secondary | ICD-10-CM | POA: Diagnosis not present

## 2022-10-03 DIAGNOSIS — I739 Peripheral vascular disease, unspecified: Secondary | ICD-10-CM | POA: Diagnosis not present

## 2022-10-03 DIAGNOSIS — E782 Mixed hyperlipidemia: Secondary | ICD-10-CM | POA: Diagnosis not present

## 2022-10-03 DIAGNOSIS — R1312 Dysphagia, oropharyngeal phase: Secondary | ICD-10-CM | POA: Diagnosis not present

## 2022-10-03 DIAGNOSIS — M6259 Muscle wasting and atrophy, not elsewhere classified, multiple sites: Secondary | ICD-10-CM | POA: Diagnosis not present

## 2022-10-15 DIAGNOSIS — E039 Hypothyroidism, unspecified: Secondary | ICD-10-CM | POA: Diagnosis not present

## 2022-10-15 DIAGNOSIS — E782 Mixed hyperlipidemia: Secondary | ICD-10-CM | POA: Diagnosis not present

## 2022-10-20 DIAGNOSIS — J449 Chronic obstructive pulmonary disease, unspecified: Secondary | ICD-10-CM | POA: Diagnosis not present

## 2022-11-03 DIAGNOSIS — R1312 Dysphagia, oropharyngeal phase: Secondary | ICD-10-CM | POA: Diagnosis not present

## 2022-11-03 DIAGNOSIS — Z8521 Personal history of malignant neoplasm of larynx: Secondary | ICD-10-CM | POA: Diagnosis not present

## 2022-11-09 DIAGNOSIS — I129 Hypertensive chronic kidney disease with stage 1 through stage 4 chronic kidney disease, or unspecified chronic kidney disease: Secondary | ICD-10-CM | POA: Diagnosis not present

## 2022-11-14 DIAGNOSIS — E782 Mixed hyperlipidemia: Secondary | ICD-10-CM | POA: Diagnosis not present

## 2022-11-14 DIAGNOSIS — E039 Hypothyroidism, unspecified: Secondary | ICD-10-CM | POA: Diagnosis not present

## 2022-11-19 DIAGNOSIS — J449 Chronic obstructive pulmonary disease, unspecified: Secondary | ICD-10-CM | POA: Diagnosis not present

## 2022-11-23 DIAGNOSIS — Z0000 Encounter for general adult medical examination without abnormal findings: Secondary | ICD-10-CM | POA: Diagnosis not present

## 2022-11-23 DIAGNOSIS — Z6821 Body mass index (BMI) 21.0-21.9, adult: Secondary | ICD-10-CM | POA: Diagnosis not present

## 2022-11-23 DIAGNOSIS — Z139 Encounter for screening, unspecified: Secondary | ICD-10-CM | POA: Diagnosis not present

## 2022-11-23 DIAGNOSIS — R636 Underweight: Secondary | ICD-10-CM | POA: Diagnosis not present

## 2022-11-23 DIAGNOSIS — Z136 Encounter for screening for cardiovascular disorders: Secondary | ICD-10-CM | POA: Diagnosis not present

## 2022-11-23 DIAGNOSIS — Z931 Gastrostomy status: Secondary | ICD-10-CM | POA: Diagnosis not present

## 2022-11-23 DIAGNOSIS — N184 Chronic kidney disease, stage 4 (severe): Secondary | ICD-10-CM | POA: Diagnosis not present

## 2022-11-23 DIAGNOSIS — I959 Hypotension, unspecified: Secondary | ICD-10-CM | POA: Diagnosis not present

## 2022-12-07 DIAGNOSIS — Z8521 Personal history of malignant neoplasm of larynx: Secondary | ICD-10-CM | POA: Diagnosis not present

## 2022-12-07 DIAGNOSIS — R1312 Dysphagia, oropharyngeal phase: Secondary | ICD-10-CM | POA: Diagnosis not present

## 2022-12-14 DIAGNOSIS — J449 Chronic obstructive pulmonary disease, unspecified: Secondary | ICD-10-CM | POA: Diagnosis not present

## 2022-12-14 DIAGNOSIS — I10 Essential (primary) hypertension: Secondary | ICD-10-CM | POA: Diagnosis not present

## 2022-12-15 DIAGNOSIS — I10 Essential (primary) hypertension: Secondary | ICD-10-CM | POA: Diagnosis not present

## 2022-12-15 DIAGNOSIS — E782 Mixed hyperlipidemia: Secondary | ICD-10-CM | POA: Diagnosis not present

## 2022-12-20 DIAGNOSIS — J449 Chronic obstructive pulmonary disease, unspecified: Secondary | ICD-10-CM | POA: Diagnosis not present

## 2023-01-03 DIAGNOSIS — Z8521 Personal history of malignant neoplasm of larynx: Secondary | ICD-10-CM | POA: Diagnosis not present

## 2023-01-03 DIAGNOSIS — R1312 Dysphagia, oropharyngeal phase: Secondary | ICD-10-CM | POA: Diagnosis not present

## 2023-01-14 DIAGNOSIS — I10 Essential (primary) hypertension: Secondary | ICD-10-CM | POA: Diagnosis not present

## 2023-01-14 DIAGNOSIS — J449 Chronic obstructive pulmonary disease, unspecified: Secondary | ICD-10-CM | POA: Diagnosis not present

## 2023-01-15 DIAGNOSIS — I10 Essential (primary) hypertension: Secondary | ICD-10-CM | POA: Diagnosis not present

## 2023-01-15 DIAGNOSIS — E782 Mixed hyperlipidemia: Secondary | ICD-10-CM | POA: Diagnosis not present

## 2023-01-20 DIAGNOSIS — E039 Hypothyroidism, unspecified: Secondary | ICD-10-CM | POA: Diagnosis not present

## 2023-01-20 DIAGNOSIS — E782 Mixed hyperlipidemia: Secondary | ICD-10-CM | POA: Diagnosis not present

## 2023-01-20 DIAGNOSIS — I129 Hypertensive chronic kidney disease with stage 1 through stage 4 chronic kidney disease, or unspecified chronic kidney disease: Secondary | ICD-10-CM | POA: Diagnosis not present

## 2023-01-27 DIAGNOSIS — Z6821 Body mass index (BMI) 21.0-21.9, adult: Secondary | ICD-10-CM | POA: Diagnosis not present

## 2023-01-27 DIAGNOSIS — I129 Hypertensive chronic kidney disease with stage 1 through stage 4 chronic kidney disease, or unspecified chronic kidney disease: Secondary | ICD-10-CM | POA: Diagnosis not present

## 2023-01-27 DIAGNOSIS — E039 Hypothyroidism, unspecified: Secondary | ICD-10-CM | POA: Diagnosis not present

## 2023-01-27 DIAGNOSIS — E782 Mixed hyperlipidemia: Secondary | ICD-10-CM | POA: Diagnosis not present

## 2023-01-27 DIAGNOSIS — N184 Chronic kidney disease, stage 4 (severe): Secondary | ICD-10-CM | POA: Diagnosis not present

## 2023-01-27 DIAGNOSIS — Z23 Encounter for immunization: Secondary | ICD-10-CM | POA: Diagnosis not present

## 2023-02-13 DIAGNOSIS — J449 Chronic obstructive pulmonary disease, unspecified: Secondary | ICD-10-CM | POA: Diagnosis not present

## 2023-02-13 DIAGNOSIS — I10 Essential (primary) hypertension: Secondary | ICD-10-CM | POA: Diagnosis not present

## 2023-02-14 DIAGNOSIS — I10 Essential (primary) hypertension: Secondary | ICD-10-CM | POA: Diagnosis not present

## 2023-02-14 DIAGNOSIS — E782 Mixed hyperlipidemia: Secondary | ICD-10-CM | POA: Diagnosis not present

## 2023-03-16 DIAGNOSIS — J449 Chronic obstructive pulmonary disease, unspecified: Secondary | ICD-10-CM | POA: Diagnosis not present

## 2023-03-16 DIAGNOSIS — I10 Essential (primary) hypertension: Secondary | ICD-10-CM | POA: Diagnosis not present

## 2023-03-17 DIAGNOSIS — E782 Mixed hyperlipidemia: Secondary | ICD-10-CM | POA: Diagnosis not present

## 2023-03-17 DIAGNOSIS — E039 Hypothyroidism, unspecified: Secondary | ICD-10-CM | POA: Diagnosis not present

## 2023-03-17 DIAGNOSIS — I10 Essential (primary) hypertension: Secondary | ICD-10-CM | POA: Diagnosis not present

## 2023-03-29 DIAGNOSIS — Z6821 Body mass index (BMI) 21.0-21.9, adult: Secondary | ICD-10-CM | POA: Diagnosis not present

## 2023-03-29 DIAGNOSIS — E039 Hypothyroidism, unspecified: Secondary | ICD-10-CM | POA: Diagnosis not present

## 2023-03-29 DIAGNOSIS — Z23 Encounter for immunization: Secondary | ICD-10-CM | POA: Diagnosis not present

## 2023-04-15 DIAGNOSIS — J449 Chronic obstructive pulmonary disease, unspecified: Secondary | ICD-10-CM | POA: Diagnosis not present

## 2023-04-15 DIAGNOSIS — I10 Essential (primary) hypertension: Secondary | ICD-10-CM | POA: Diagnosis not present

## 2023-04-16 DIAGNOSIS — I10 Essential (primary) hypertension: Secondary | ICD-10-CM | POA: Diagnosis not present

## 2023-04-16 DIAGNOSIS — E782 Mixed hyperlipidemia: Secondary | ICD-10-CM | POA: Diagnosis not present

## 2023-05-16 DIAGNOSIS — I10 Essential (primary) hypertension: Secondary | ICD-10-CM | POA: Diagnosis not present

## 2023-05-16 DIAGNOSIS — N184 Chronic kidney disease, stage 4 (severe): Secondary | ICD-10-CM | POA: Diagnosis not present

## 2023-05-17 DIAGNOSIS — E782 Mixed hyperlipidemia: Secondary | ICD-10-CM | POA: Diagnosis not present

## 2023-05-17 DIAGNOSIS — N184 Chronic kidney disease, stage 4 (severe): Secondary | ICD-10-CM | POA: Diagnosis not present

## 2023-05-24 ENCOUNTER — Emergency Department (HOSPITAL_COMMUNITY)
Admission: EM | Admit: 2023-05-24 | Discharge: 2023-05-24 | Disposition: A | Discharge status: Home / Self Care | Payer: Medicare Other | Attending: Emergency Medicine | Admitting: Emergency Medicine

## 2023-05-24 ENCOUNTER — Emergency Department (HOSPITAL_COMMUNITY): Payer: Medicare Other

## 2023-05-24 DIAGNOSIS — E039 Hypothyroidism, unspecified: Secondary | ICD-10-CM | POA: Diagnosis not present

## 2023-05-24 DIAGNOSIS — K9423 Gastrostomy malfunction: Secondary | ICD-10-CM | POA: Diagnosis not present

## 2023-05-24 DIAGNOSIS — K9429 Other complications of gastrostomy: Secondary | ICD-10-CM | POA: Diagnosis not present

## 2023-05-24 HISTORY — PX: IR REPLC GASTRO/COLONIC TUBE PERCUT W/FLUORO: IMG2333

## 2023-05-24 MED ORDER — IOHEXOL 300 MG/ML  SOLN
50.0000 mL | Freq: Once | INTRAMUSCULAR | Status: AC | PRN
  Administered 2023-05-24: 15 mL

## 2023-05-24 MED ORDER — LIDOCAINE VISCOUS HCL 2 % MT SOLN
OROMUCOSAL | Status: AC
  Filled 2023-05-24: qty 15

## 2023-05-24 NOTE — ED Notes (Signed)
 Patient verbalizes understanding of discharge instructions. Opportunity for questioning and answers were provided. Armband removed by staff, pt discharged from ED. Pt taken to ED waiting room via wheel chair.

## 2023-05-24 NOTE — Discharge Instructions (Signed)
Follow up with your family doctor in the office.   

## 2023-05-24 NOTE — Procedures (Signed)
 Interventional Radiology Procedure:   Indications: Broken/cut gastrostomy tube  Procedure: Gastrostomy tube exchange  Findings: Old pull-through gastrostomy tube was removed with traction.  New 20 Fr balloon retention tube placed with fluoroscopy.  Contrast injection confirmed placement in stomach.   Complications: None     EBL: Minimal  Plan: New gastrostomy tube is ready to use.   Anedra Penafiel R. Philip, MD  Pager: [phone]

## 2023-05-24 NOTE — ED Provider Notes (Signed)
 [place] [facility]GENC[facility] DEPARTMENT AT [facility] Provider Note   CSN: [id number] Arrival date & time: 05/24/23  1018     History  Chief Complaint  Patient presents with   G-tube out    [name] [name] is a 83 y.o. female.  83 yo F with a chief complaints of cutting her G-tube.  This happened earlier today.  Patient has not had an exchange think since it was initially placed in 2022.  They are not sure what size it is.  Otherwise denies any acute problems.        Home Medications Prior to Admission medications   Medication Sig Start Date End Date Taking? Authorizing Provider  albuterol  (VENTOLIN  HFA) 108 (90 Base) MCG/ACT inhaler Inhale 2 puffs into the lungs every 6 (six) hours as needed. 11/09/20   [provider]  atorvastatin  (LIPITOR) 40 MG tablet Place 1 tablet (40 mg total) into feeding tube daily. 07/20/22 07/20/23  Perri DELENA Meliton Mickey., MD  levothyroxine  (SYNTHROID ) 50 MCG tablet Place 1 tablet (50 mcg total) into feeding tube daily. Patient not taking: Reported on 07/14/2022 01/31/21   Espinoza, Alejandra, DO  Nutritional Supplements (FEEDING SUPPLEMENT, JEVITY 1.5 CAL/FIBER,) LIQD Place 237 mLs into feeding tube 5 (five) times daily. 07/20/22   Perri DELENA Meliton Mickey., MD  thiamine  (VITAMIN B-1) 100 MG tablet Place 1 tablet (100 mg total) into feeding tube daily. 07/21/22   Perri DELENA Meliton Mickey., MD  tiotropium (SPIRIVA ) 18 MCG inhalation capsule Place 18 mcg into inhaler and inhale daily.      [provider]  Water  For Irrigation, Sterile (FREE WATER ) SOLN Place 150 mLs into feeding tube 5 (five) times daily. 07/20/22   Perri DELENA Meliton Mickey., MD      Allergies    Patient has no known allergies.    Review of Systems   Review of Systems  Physical Exam Updated Vital Signs There were no vitals taken for this visit. Physical Exam Vitals and nursing note reviewed.  Constitutional:      General: She is not in acute distress.    Appearance: She is  well-developed. She is not diaphoretic.  HENT:     Head: Normocephalic and atraumatic.  Eyes:     Pupils: Pupils are equal, round, and reactive to light.  Cardiovascular:     Rate and Rhythm: Normal rate and regular rhythm.     Heart sounds: No murmur heard.    No friction rub. No gallop.  Pulmonary:     Effort: Pulmonary effort is normal.     Breath sounds: No wheezing or rales.  Abdominal:     General: There is no distension.     Palpations: Abdomen is soft.     Tenderness: There is no abdominal tenderness.     Comments: There is some erythema at this site.  Granulation tissue noted.  I am able to mobilize the G-tube which is transversely cut.  It seems that the balloon is likely still inflated  Musculoskeletal:        General: No tenderness.     Cervical back: Normal range of motion and neck supple.  Skin:    General: Skin is warm and dry.  Neurological:     Mental Status: She is alert and oriented to person, place, and time.  Psychiatric:        Behavior: Behavior normal.     ED Results / Procedures / Treatments   Labs (all labs ordered are listed, but only  abnormal results are displayed) Labs Reviewed - No data to display  EKG None  Radiology No results found.  Procedures Procedures    Medications Ordered in ED Medications  iohexol  (OMNIPAQUE ) 300 MG/ML solution 50 mL (15 mLs Per Tube Contrast Given 05/24/23 1448)    ED Course/ Medical Decision Making/ A&P                                 Medical Decision Making  83 yo F with a chief complaints of cutting her G-tube and advertently.  The family seems to think that it has not been replaced since it was initially placed about 2 years ago.  I was unable to get a 20 French tube initially.  I did try to remove the tube with plans to place either a 22 French tube or a 20 French Foley catheter.  Unfortunately was unable to remove it.  I do not appreciate any obvious tubing that I could try to stick a needle into  deflate the balloon.  I discussed this with Dr. Alona, interventional radiology.  Recommends that I put in an IR consult and they will evaluate.  IR was able to exchange the G-tube.  Will discharge home.  3:18 PM:  I have discussed the diagnosis/risks/treatment options with the patient.  Evaluation and diagnostic testing in the emergency department does not suggest an emergent condition requiring admission or immediate intervention beyond what has been performed at this time.  They will follow up with PCP. We also discussed returning to the ED immediately if new or worsening sx occur. We discussed the sx which are most concerning (e.g., sudden worsening pain, fever, inability to tolerate by mouth) that necessitate immediate return. Medications administered to the patient during their visit and any new prescriptions provided to the patient are listed below.  Medications given during this visit Medications  iohexol  (OMNIPAQUE ) 300 MG/ML solution 50 mL (15 mLs Per Tube Contrast Given 05/24/23 1448)     The patient appears reasonably screen and/or stabilized for discharge and I doubt any other medical condition or other [facility] requiring further screening, evaluation, or treatment in the ED at this time prior to discharge.          Final Clinical Impression(s) / ED Diagnoses Final diagnoses:  Gastrostomy tube dysfunction [facility])    Rx / DC Orders ED Discharge Orders     None         Emil Share, DO 05/24/23 1518

## 2023-05-24 NOTE — ED Provider Triage Note (Signed)
 Emergency Medicine Provider Triage Evaluation Note  [name] [name] , a 83 y.o. female  was evaluated in triage.  Pt complains of inadvertently cutting her G-tube.  They were attempting to change the dressing this morning and cut through the G-tube by accident.  Otherwise has been doing okay.  Review of Systems  Positive: Cutting transversely through the G-tube Negative: Abdominal pain vomiting fevers  Physical Exam  There were no vitals taken for this visit. Gen:   Awake, no distress   Resp:  Normal effort  MSK:   Moves extremities without difficulty  Other:  G-tube is cut, there is some granulation tissue and erythema at the G-tube site.  I am able to mobilize the G-tube well but it does feel that the balloon may be still inflated  Medical Decision Making  Medically screening exam initiated at 11:43 AM.  Appropriate orders placed.  [name] [name] was informed that the remainder of the evaluation will be completed by another provider, this initial triage assessment does not replace that evaluation, and the importance of remaining in the ED until their evaluation is complete.  83 year old female with a chief complaints of cutting her G-tube by accident.  On my record review in the last G-tube placement by IR was done in 2022.  She had a 20 French tube placed.  I was going to attempt to replace it though the smallest I could find in the emergency department stock was a 22.  I am also unable to remove the G-tube.  Hemostats for placed to occlude the tube.  Will discuss with IR.   Emil Share, DO 05/24/23 1145

## 2023-05-24 NOTE — ED Triage Notes (Signed)
 Pt's tube was accidentally cut G-Tube

## 2023-05-29 DIAGNOSIS — E039 Hypothyroidism, unspecified: Secondary | ICD-10-CM | POA: Diagnosis not present

## 2023-05-29 DIAGNOSIS — Z931 Gastrostomy status: Secondary | ICD-10-CM | POA: Diagnosis not present

## 2023-05-29 DIAGNOSIS — Z681 Body mass index (BMI) 19 or less, adult: Secondary | ICD-10-CM | POA: Diagnosis not present

## 2023-05-29 DIAGNOSIS — J449 Chronic obstructive pulmonary disease, unspecified: Secondary | ICD-10-CM | POA: Diagnosis not present

## 2023-05-31 ENCOUNTER — Encounter (HOSPITAL_COMMUNITY): Payer: Self-pay

## 2023-05-31 ENCOUNTER — Telehealth: Payer: Self-pay

## 2023-05-31 NOTE — Progress Notes (Signed)
,  Transition Care Management Follow-up Telephone Call Date of discharge and from where: 05/24/2023 [facility] How have you been since you were released from the hospital? Patient stated she is feeling better. Any questions or concerns? No  Items Reviewed: Did the pt receive and understand the discharge instructions provided? Yes  Medications obtained and verified?  No medication prescribed Other? No  Any new allergies since your discharge? No  Dietary orders reviewed? Yes Do you have support at home? Yes   Follow up appointments reviewed:  PCP Hospital f/u appt confirmed? No  Scheduled to see  on  @ . Specialist Hospital f/u appt confirmed? No  Scheduled to see  on  @ . Are transportation arrangements needed? No  If their condition worsens, is the pt aware to call PCP or go to the Emergency Dept.? Yes Was the patient provided with contact information for the PCP's office or ED? Yes Was to pt encouraged to call back with questions or concerns? Yes   [name] [name] [facility]  [facility] Guide Direct Dial: [phone]  Fax: [phone] Website: [email or web address]

## 2023-06-07 DIAGNOSIS — E039 Hypothyroidism, unspecified: Secondary | ICD-10-CM | POA: Diagnosis not present

## 2023-06-07 DIAGNOSIS — R634 Abnormal weight loss: Secondary | ICD-10-CM | POA: Diagnosis not present

## 2023-06-07 DIAGNOSIS — I779 Disorder of arteries and arterioles, unspecified: Secondary | ICD-10-CM | POA: Diagnosis not present

## 2023-06-07 DIAGNOSIS — E441 Mild protein-calorie malnutrition: Secondary | ICD-10-CM | POA: Diagnosis not present

## 2023-06-07 DIAGNOSIS — Z681 Body mass index (BMI) 19 or less, adult: Secondary | ICD-10-CM | POA: Diagnosis not present

## 2023-06-07 DIAGNOSIS — J449 Chronic obstructive pulmonary disease, unspecified: Secondary | ICD-10-CM | POA: Diagnosis not present

## 2023-06-07 DIAGNOSIS — E782 Mixed hyperlipidemia: Secondary | ICD-10-CM | POA: Diagnosis not present

## 2023-06-07 DIAGNOSIS — I10 Essential (primary) hypertension: Secondary | ICD-10-CM | POA: Diagnosis not present

## 2023-06-07 DIAGNOSIS — I700 Atherosclerosis of aorta: Secondary | ICD-10-CM | POA: Diagnosis not present

## 2023-06-07 DIAGNOSIS — Z431 Encounter for attention to gastrostomy: Secondary | ICD-10-CM | POA: Diagnosis not present

## 2023-06-10 DIAGNOSIS — D485 Neoplasm of uncertain behavior of skin: Secondary | ICD-10-CM | POA: Diagnosis not present

## 2023-06-10 DIAGNOSIS — L821 Other seborrheic keratosis: Secondary | ICD-10-CM | POA: Diagnosis not present

## 2023-06-12 DIAGNOSIS — E039 Hypothyroidism, unspecified: Secondary | ICD-10-CM | POA: Diagnosis not present

## 2023-06-12 DIAGNOSIS — I10 Essential (primary) hypertension: Secondary | ICD-10-CM | POA: Diagnosis not present

## 2023-06-12 DIAGNOSIS — R634 Abnormal weight loss: Secondary | ICD-10-CM | POA: Diagnosis not present

## 2023-06-12 DIAGNOSIS — I779 Disorder of arteries and arterioles, unspecified: Secondary | ICD-10-CM | POA: Diagnosis not present

## 2023-06-12 DIAGNOSIS — J449 Chronic obstructive pulmonary disease, unspecified: Secondary | ICD-10-CM | POA: Diagnosis not present

## 2023-06-12 DIAGNOSIS — I700 Atherosclerosis of aorta: Secondary | ICD-10-CM | POA: Diagnosis not present

## 2023-06-12 DIAGNOSIS — E441 Mild protein-calorie malnutrition: Secondary | ICD-10-CM | POA: Diagnosis not present

## 2023-06-12 DIAGNOSIS — E782 Mixed hyperlipidemia: Secondary | ICD-10-CM | POA: Diagnosis not present

## 2023-06-12 DIAGNOSIS — Z681 Body mass index (BMI) 19 or less, adult: Secondary | ICD-10-CM | POA: Diagnosis not present

## 2023-06-12 DIAGNOSIS — Z431 Encounter for attention to gastrostomy: Secondary | ICD-10-CM | POA: Diagnosis not present

## 2023-06-15 DIAGNOSIS — J449 Chronic obstructive pulmonary disease, unspecified: Secondary | ICD-10-CM | POA: Diagnosis not present

## 2023-06-15 DIAGNOSIS — R634 Abnormal weight loss: Secondary | ICD-10-CM | POA: Diagnosis not present

## 2023-06-15 DIAGNOSIS — Z681 Body mass index (BMI) 19 or less, adult: Secondary | ICD-10-CM | POA: Diagnosis not present

## 2023-06-15 DIAGNOSIS — I10 Essential (primary) hypertension: Secondary | ICD-10-CM | POA: Diagnosis not present

## 2023-06-15 DIAGNOSIS — E441 Mild protein-calorie malnutrition: Secondary | ICD-10-CM | POA: Diagnosis not present

## 2023-06-15 DIAGNOSIS — I700 Atherosclerosis of aorta: Secondary | ICD-10-CM | POA: Diagnosis not present

## 2023-06-15 DIAGNOSIS — Z431 Encounter for attention to gastrostomy: Secondary | ICD-10-CM | POA: Diagnosis not present

## 2023-06-15 DIAGNOSIS — E782 Mixed hyperlipidemia: Secondary | ICD-10-CM | POA: Diagnosis not present

## 2023-06-15 DIAGNOSIS — E039 Hypothyroidism, unspecified: Secondary | ICD-10-CM | POA: Diagnosis not present

## 2023-06-15 DIAGNOSIS — I779 Disorder of arteries and arterioles, unspecified: Secondary | ICD-10-CM | POA: Diagnosis not present

## 2023-06-16 DIAGNOSIS — N184 Chronic kidney disease, stage 4 (severe): Secondary | ICD-10-CM | POA: Diagnosis not present

## 2023-06-16 DIAGNOSIS — C44519 Basal cell carcinoma of skin of other part of trunk: Secondary | ICD-10-CM | POA: Diagnosis not present

## 2023-06-16 DIAGNOSIS — I10 Essential (primary) hypertension: Secondary | ICD-10-CM | POA: Diagnosis not present

## 2023-06-17 DIAGNOSIS — E782 Mixed hyperlipidemia: Secondary | ICD-10-CM | POA: Diagnosis not present

## 2023-06-17 DIAGNOSIS — N184 Chronic kidney disease, stage 4 (severe): Secondary | ICD-10-CM | POA: Diagnosis not present

## 2023-06-19 DIAGNOSIS — J449 Chronic obstructive pulmonary disease, unspecified: Secondary | ICD-10-CM | POA: Diagnosis not present

## 2023-06-19 DIAGNOSIS — R634 Abnormal weight loss: Secondary | ICD-10-CM | POA: Diagnosis not present

## 2023-06-19 DIAGNOSIS — I779 Disorder of arteries and arterioles, unspecified: Secondary | ICD-10-CM | POA: Diagnosis not present

## 2023-06-19 DIAGNOSIS — E039 Hypothyroidism, unspecified: Secondary | ICD-10-CM | POA: Diagnosis not present

## 2023-06-19 DIAGNOSIS — I10 Essential (primary) hypertension: Secondary | ICD-10-CM | POA: Diagnosis not present

## 2023-06-19 DIAGNOSIS — I700 Atherosclerosis of aorta: Secondary | ICD-10-CM | POA: Diagnosis not present

## 2023-06-19 DIAGNOSIS — E441 Mild protein-calorie malnutrition: Secondary | ICD-10-CM | POA: Diagnosis not present

## 2023-06-19 DIAGNOSIS — Z681 Body mass index (BMI) 19 or less, adult: Secondary | ICD-10-CM | POA: Diagnosis not present

## 2023-06-19 DIAGNOSIS — E782 Mixed hyperlipidemia: Secondary | ICD-10-CM | POA: Diagnosis not present

## 2023-06-19 DIAGNOSIS — Z431 Encounter for attention to gastrostomy: Secondary | ICD-10-CM | POA: Diagnosis not present

## 2023-06-22 DIAGNOSIS — I779 Disorder of arteries and arterioles, unspecified: Secondary | ICD-10-CM | POA: Diagnosis not present

## 2023-06-22 DIAGNOSIS — Z431 Encounter for attention to gastrostomy: Secondary | ICD-10-CM | POA: Diagnosis not present

## 2023-06-22 DIAGNOSIS — Z681 Body mass index (BMI) 19 or less, adult: Secondary | ICD-10-CM | POA: Diagnosis not present

## 2023-06-22 DIAGNOSIS — E039 Hypothyroidism, unspecified: Secondary | ICD-10-CM | POA: Diagnosis not present

## 2023-06-22 DIAGNOSIS — E782 Mixed hyperlipidemia: Secondary | ICD-10-CM | POA: Diagnosis not present

## 2023-06-22 DIAGNOSIS — R634 Abnormal weight loss: Secondary | ICD-10-CM | POA: Diagnosis not present

## 2023-06-22 DIAGNOSIS — E441 Mild protein-calorie malnutrition: Secondary | ICD-10-CM | POA: Diagnosis not present

## 2023-06-22 DIAGNOSIS — J449 Chronic obstructive pulmonary disease, unspecified: Secondary | ICD-10-CM | POA: Diagnosis not present

## 2023-06-22 DIAGNOSIS — I700 Atherosclerosis of aorta: Secondary | ICD-10-CM | POA: Diagnosis not present

## 2023-06-22 DIAGNOSIS — I10 Essential (primary) hypertension: Secondary | ICD-10-CM | POA: Diagnosis not present

## 2023-06-23 DIAGNOSIS — Z681 Body mass index (BMI) 19 or less, adult: Secondary | ICD-10-CM | POA: Diagnosis not present

## 2023-06-23 DIAGNOSIS — J449 Chronic obstructive pulmonary disease, unspecified: Secondary | ICD-10-CM | POA: Diagnosis not present

## 2023-06-28 DIAGNOSIS — E782 Mixed hyperlipidemia: Secondary | ICD-10-CM | POA: Diagnosis not present

## 2023-06-28 DIAGNOSIS — Z681 Body mass index (BMI) 19 or less, adult: Secondary | ICD-10-CM | POA: Diagnosis not present

## 2023-06-28 DIAGNOSIS — E441 Mild protein-calorie malnutrition: Secondary | ICD-10-CM | POA: Diagnosis not present

## 2023-06-28 DIAGNOSIS — E039 Hypothyroidism, unspecified: Secondary | ICD-10-CM | POA: Diagnosis not present

## 2023-06-28 DIAGNOSIS — R634 Abnormal weight loss: Secondary | ICD-10-CM | POA: Diagnosis not present

## 2023-06-28 DIAGNOSIS — I779 Disorder of arteries and arterioles, unspecified: Secondary | ICD-10-CM | POA: Diagnosis not present

## 2023-06-28 DIAGNOSIS — I10 Essential (primary) hypertension: Secondary | ICD-10-CM | POA: Diagnosis not present

## 2023-06-28 DIAGNOSIS — I700 Atherosclerosis of aorta: Secondary | ICD-10-CM | POA: Diagnosis not present

## 2023-06-28 DIAGNOSIS — Z431 Encounter for attention to gastrostomy: Secondary | ICD-10-CM | POA: Diagnosis not present

## 2023-06-28 DIAGNOSIS — J449 Chronic obstructive pulmonary disease, unspecified: Secondary | ICD-10-CM | POA: Diagnosis not present

## 2023-06-30 DIAGNOSIS — E039 Hypothyroidism, unspecified: Secondary | ICD-10-CM | POA: Diagnosis not present

## 2023-07-12 DIAGNOSIS — J449 Chronic obstructive pulmonary disease, unspecified: Secondary | ICD-10-CM | POA: Diagnosis not present

## 2023-07-12 DIAGNOSIS — I779 Disorder of arteries and arterioles, unspecified: Secondary | ICD-10-CM | POA: Diagnosis not present

## 2023-07-12 DIAGNOSIS — I700 Atherosclerosis of aorta: Secondary | ICD-10-CM | POA: Diagnosis not present

## 2023-07-12 DIAGNOSIS — E441 Mild protein-calorie malnutrition: Secondary | ICD-10-CM | POA: Diagnosis not present

## 2023-07-12 DIAGNOSIS — E039 Hypothyroidism, unspecified: Secondary | ICD-10-CM | POA: Diagnosis not present

## 2023-07-12 DIAGNOSIS — E782 Mixed hyperlipidemia: Secondary | ICD-10-CM | POA: Diagnosis not present

## 2023-07-12 DIAGNOSIS — Z431 Encounter for attention to gastrostomy: Secondary | ICD-10-CM | POA: Diagnosis not present

## 2023-07-12 DIAGNOSIS — Z681 Body mass index (BMI) 19 or less, adult: Secondary | ICD-10-CM | POA: Diagnosis not present

## 2023-07-12 DIAGNOSIS — R634 Abnormal weight loss: Secondary | ICD-10-CM | POA: Diagnosis not present

## 2023-07-12 DIAGNOSIS — I10 Essential (primary) hypertension: Secondary | ICD-10-CM | POA: Diagnosis not present

## 2023-07-14 DIAGNOSIS — N184 Chronic kidney disease, stage 4 (severe): Secondary | ICD-10-CM | POA: Diagnosis not present

## 2023-07-14 DIAGNOSIS — I10 Essential (primary) hypertension: Secondary | ICD-10-CM | POA: Diagnosis not present

## 2023-07-15 DIAGNOSIS — N184 Chronic kidney disease, stage 4 (severe): Secondary | ICD-10-CM | POA: Diagnosis not present

## 2023-07-15 DIAGNOSIS — E782 Mixed hyperlipidemia: Secondary | ICD-10-CM | POA: Diagnosis not present

## 2023-07-17 DIAGNOSIS — E039 Hypothyroidism, unspecified: Secondary | ICD-10-CM | POA: Diagnosis not present

## 2023-07-17 DIAGNOSIS — Z681 Body mass index (BMI) 19 or less, adult: Secondary | ICD-10-CM | POA: Diagnosis not present

## 2023-07-26 DIAGNOSIS — Z681 Body mass index (BMI) 19 or less, adult: Secondary | ICD-10-CM | POA: Diagnosis not present

## 2023-07-26 DIAGNOSIS — I779 Disorder of arteries and arterioles, unspecified: Secondary | ICD-10-CM | POA: Diagnosis not present

## 2023-07-26 DIAGNOSIS — Z431 Encounter for attention to gastrostomy: Secondary | ICD-10-CM | POA: Diagnosis not present

## 2023-07-26 DIAGNOSIS — R634 Abnormal weight loss: Secondary | ICD-10-CM | POA: Diagnosis not present

## 2023-07-26 DIAGNOSIS — J449 Chronic obstructive pulmonary disease, unspecified: Secondary | ICD-10-CM | POA: Diagnosis not present

## 2023-07-26 DIAGNOSIS — E782 Mixed hyperlipidemia: Secondary | ICD-10-CM | POA: Diagnosis not present

## 2023-07-26 DIAGNOSIS — E441 Mild protein-calorie malnutrition: Secondary | ICD-10-CM | POA: Diagnosis not present

## 2023-07-26 DIAGNOSIS — I10 Essential (primary) hypertension: Secondary | ICD-10-CM | POA: Diagnosis not present

## 2023-07-26 DIAGNOSIS — E039 Hypothyroidism, unspecified: Secondary | ICD-10-CM | POA: Diagnosis not present

## 2023-07-26 DIAGNOSIS — I700 Atherosclerosis of aorta: Secondary | ICD-10-CM | POA: Diagnosis not present

## 2023-08-02 DIAGNOSIS — R634 Abnormal weight loss: Secondary | ICD-10-CM | POA: Diagnosis not present

## 2023-08-02 DIAGNOSIS — I700 Atherosclerosis of aorta: Secondary | ICD-10-CM | POA: Diagnosis not present

## 2023-08-02 DIAGNOSIS — Z431 Encounter for attention to gastrostomy: Secondary | ICD-10-CM | POA: Diagnosis not present

## 2023-08-02 DIAGNOSIS — E441 Mild protein-calorie malnutrition: Secondary | ICD-10-CM | POA: Diagnosis not present

## 2023-08-02 DIAGNOSIS — I10 Essential (primary) hypertension: Secondary | ICD-10-CM | POA: Diagnosis not present

## 2023-08-02 DIAGNOSIS — J449 Chronic obstructive pulmonary disease, unspecified: Secondary | ICD-10-CM | POA: Diagnosis not present

## 2023-08-02 DIAGNOSIS — I779 Disorder of arteries and arterioles, unspecified: Secondary | ICD-10-CM | POA: Diagnosis not present

## 2023-08-02 DIAGNOSIS — E039 Hypothyroidism, unspecified: Secondary | ICD-10-CM | POA: Diagnosis not present

## 2023-08-02 DIAGNOSIS — E782 Mixed hyperlipidemia: Secondary | ICD-10-CM | POA: Diagnosis not present

## 2023-08-02 DIAGNOSIS — Z681 Body mass index (BMI) 19 or less, adult: Secondary | ICD-10-CM | POA: Diagnosis not present

## 2023-08-15 DIAGNOSIS — E785 Hyperlipidemia, unspecified: Secondary | ICD-10-CM | POA: Diagnosis not present

## 2023-08-15 DIAGNOSIS — I10 Essential (primary) hypertension: Secondary | ICD-10-CM | POA: Diagnosis not present

## 2023-08-29 DIAGNOSIS — E039 Hypothyroidism, unspecified: Secondary | ICD-10-CM | POA: Diagnosis not present

## 2023-09-04 DIAGNOSIS — K9422 Gastrostomy infection: Secondary | ICD-10-CM | POA: Diagnosis not present

## 2023-09-04 DIAGNOSIS — K9423 Gastrostomy malfunction: Secondary | ICD-10-CM | POA: Diagnosis not present

## 2023-09-06 DIAGNOSIS — E039 Hypothyroidism, unspecified: Secondary | ICD-10-CM | POA: Diagnosis not present

## 2023-09-06 DIAGNOSIS — K9420 Gastrostomy complication, unspecified: Secondary | ICD-10-CM | POA: Diagnosis not present

## 2023-09-06 DIAGNOSIS — Z681 Body mass index (BMI) 19 or less, adult: Secondary | ICD-10-CM | POA: Diagnosis not present

## 2023-09-13 DIAGNOSIS — I10 Essential (primary) hypertension: Secondary | ICD-10-CM | POA: Diagnosis not present

## 2023-09-14 DIAGNOSIS — E039 Hypothyroidism, unspecified: Secondary | ICD-10-CM | POA: Diagnosis not present

## 2023-09-14 DIAGNOSIS — I10 Essential (primary) hypertension: Secondary | ICD-10-CM | POA: Diagnosis not present

## 2023-09-22 DIAGNOSIS — C44519 Basal cell carcinoma of skin of other part of trunk: Secondary | ICD-10-CM | POA: Diagnosis not present

## 2023-10-14 DIAGNOSIS — I10 Essential (primary) hypertension: Secondary | ICD-10-CM | POA: Diagnosis not present

## 2023-10-15 DIAGNOSIS — E039 Hypothyroidism, unspecified: Secondary | ICD-10-CM | POA: Diagnosis not present

## 2023-10-15 DIAGNOSIS — I10 Essential (primary) hypertension: Secondary | ICD-10-CM | POA: Diagnosis not present

## 2023-10-23 DIAGNOSIS — E039 Hypothyroidism, unspecified: Secondary | ICD-10-CM | POA: Diagnosis not present

## 2023-11-13 DIAGNOSIS — I10 Essential (primary) hypertension: Secondary | ICD-10-CM | POA: Diagnosis not present

## 2023-11-14 DIAGNOSIS — E039 Hypothyroidism, unspecified: Secondary | ICD-10-CM | POA: Diagnosis not present

## 2023-11-14 DIAGNOSIS — I10 Essential (primary) hypertension: Secondary | ICD-10-CM | POA: Diagnosis not present

## 2023-12-14 DIAGNOSIS — I10 Essential (primary) hypertension: Secondary | ICD-10-CM | POA: Diagnosis not present

## 2023-12-15 DIAGNOSIS — E039 Hypothyroidism, unspecified: Secondary | ICD-10-CM | POA: Diagnosis not present

## 2023-12-15 DIAGNOSIS — I10 Essential (primary) hypertension: Secondary | ICD-10-CM | POA: Diagnosis not present

## 2023-12-22 DIAGNOSIS — E039 Hypothyroidism, unspecified: Secondary | ICD-10-CM | POA: Diagnosis not present

## 2024-01-05 DIAGNOSIS — E039 Hypothyroidism, unspecified: Secondary | ICD-10-CM | POA: Diagnosis not present

## 2024-01-05 DIAGNOSIS — Z681 Body mass index (BMI) 19 or less, adult: Secondary | ICD-10-CM | POA: Diagnosis not present

## 2024-01-05 DIAGNOSIS — E441 Mild protein-calorie malnutrition: Secondary | ICD-10-CM | POA: Diagnosis not present

## 2024-01-05 DIAGNOSIS — Z4659 Encounter for fitting and adjustment of other gastrointestinal appliance and device: Secondary | ICD-10-CM | POA: Diagnosis not present

## 2024-01-14 DIAGNOSIS — I10 Essential (primary) hypertension: Secondary | ICD-10-CM | POA: Diagnosis not present

## 2024-01-15 DIAGNOSIS — I10 Essential (primary) hypertension: Secondary | ICD-10-CM | POA: Diagnosis not present

## 2024-01-15 DIAGNOSIS — E039 Hypothyroidism, unspecified: Secondary | ICD-10-CM | POA: Diagnosis not present

## 2024-02-13 DIAGNOSIS — I10 Essential (primary) hypertension: Secondary | ICD-10-CM | POA: Diagnosis not present

## 2024-02-14 DIAGNOSIS — E785 Hyperlipidemia, unspecified: Secondary | ICD-10-CM | POA: Diagnosis not present

## 2024-02-14 DIAGNOSIS — E039 Hypothyroidism, unspecified: Secondary | ICD-10-CM | POA: Diagnosis not present

## 2024-02-16 DIAGNOSIS — E039 Hypothyroidism, unspecified: Secondary | ICD-10-CM | POA: Diagnosis not present

## 2024-02-16 DIAGNOSIS — Z23 Encounter for immunization: Secondary | ICD-10-CM | POA: Diagnosis not present

## 2024-02-26 DIAGNOSIS — J449 Chronic obstructive pulmonary disease, unspecified: Secondary | ICD-10-CM | POA: Diagnosis not present

## 2024-02-26 DIAGNOSIS — E039 Hypothyroidism, unspecified: Secondary | ICD-10-CM | POA: Diagnosis not present

## 2024-02-26 DIAGNOSIS — Z681 Body mass index (BMI) 19 or less, adult: Secondary | ICD-10-CM | POA: Diagnosis not present
